# Patient Record
Sex: Female | Born: 1935 | Race: White | Hispanic: No | Smoking: Never smoker
Health system: Southern US, Community
[De-identification: ages and names within clinical notes are randomized; demographics above are authoritative.]

## PROBLEM LIST (undated history)

## (undated) DIAGNOSIS — L120 Bullous pemphigoid: Secondary | ICD-10-CM

## (undated) DIAGNOSIS — E785 Hyperlipidemia, unspecified: Secondary | ICD-10-CM

## (undated) DIAGNOSIS — S060XAA Concussion with loss of consciousness status unknown, initial encounter: Secondary | ICD-10-CM

## (undated) DIAGNOSIS — D649 Anemia, unspecified: Secondary | ICD-10-CM

## (undated) DIAGNOSIS — J302 Other seasonal allergic rhinitis: Secondary | ICD-10-CM

## (undated) DIAGNOSIS — M419 Scoliosis, unspecified: Secondary | ICD-10-CM

## (undated) DIAGNOSIS — S62109A Fracture of unspecified carpal bone, unspecified wrist, initial encounter for closed fracture: Secondary | ICD-10-CM

## (undated) DIAGNOSIS — S0291XA Unspecified fracture of skull, initial encounter for closed fracture: Secondary | ICD-10-CM

## (undated) DIAGNOSIS — E538 Deficiency of other specified B group vitamins: Secondary | ICD-10-CM

## (undated) DIAGNOSIS — M47816 Spondylosis without myelopathy or radiculopathy, lumbar region: Secondary | ICD-10-CM

## (undated) DIAGNOSIS — M1712 Unilateral primary osteoarthritis, left knee: Secondary | ICD-10-CM

## (undated) DIAGNOSIS — M8580 Other specified disorders of bone density and structure, unspecified site: Secondary | ICD-10-CM

## (undated) DIAGNOSIS — I10 Essential (primary) hypertension: Secondary | ICD-10-CM

## (undated) DIAGNOSIS — R7301 Impaired fasting glucose: Secondary | ICD-10-CM

## (undated) DIAGNOSIS — H409 Unspecified glaucoma: Secondary | ICD-10-CM

## (undated) DIAGNOSIS — G4733 Obstructive sleep apnea (adult) (pediatric): Secondary | ICD-10-CM

## (undated) DIAGNOSIS — S060X9A Concussion with loss of consciousness of unspecified duration, initial encounter: Secondary | ICD-10-CM

## (undated) DIAGNOSIS — H348122 Central retinal vein occlusion, left eye, stable: Secondary | ICD-10-CM

## (undated) DIAGNOSIS — I679 Cerebrovascular disease, unspecified: Secondary | ICD-10-CM

## (undated) DIAGNOSIS — G43909 Migraine, unspecified, not intractable, without status migrainosus: Secondary | ICD-10-CM

## (undated) HISTORY — PX: OVARIAN CYST REMOVAL: SHX89

## (undated) HISTORY — DX: Cerebrovascular disease, unspecified: I67.9

## (undated) HISTORY — DX: Scoliosis, unspecified: M41.9

## (undated) HISTORY — DX: Deficiency of other specified B group vitamins: E53.8

## (undated) HISTORY — DX: Unilateral primary osteoarthritis, left knee: M17.12

## (undated) HISTORY — PX: VAGINAL HYSTERECTOMY: SUR661

## (undated) HISTORY — DX: Fracture of unspecified carpal bone, unspecified wrist, initial encounter for closed fracture: S62.109A

## (undated) HISTORY — DX: Unspecified glaucoma: H40.9

## (undated) HISTORY — DX: Concussion with loss of consciousness of unspecified duration, initial encounter: S06.0X9A

## (undated) HISTORY — DX: Anemia, unspecified: D64.9

## (undated) HISTORY — DX: Bullous pemphigoid: L12.0

## (undated) HISTORY — DX: Obstructive sleep apnea (adult) (pediatric): G47.33

## (undated) HISTORY — DX: Hyperlipidemia, unspecified: E78.5

## (undated) HISTORY — DX: Other seasonal allergic rhinitis: J30.2

## (undated) HISTORY — DX: Spondylosis without myelopathy or radiculopathy, lumbar region: M47.816

## (undated) HISTORY — DX: Concussion with loss of consciousness status unknown, initial encounter: S06.0XAA

## (undated) HISTORY — DX: Other specified disorders of bone density and structure, unspecified site: M85.80

## (undated) HISTORY — DX: Central retinal vein occlusion, left eye, stable: H34.8122

## (undated) HISTORY — DX: Migraine, unspecified, not intractable, without status migrainosus: G43.909

## (undated) HISTORY — DX: Impaired fasting glucose: R73.01

## (undated) HISTORY — PX: ANKLE FRACTURE SURGERY: SHX122

---

## 1999-03-02 ENCOUNTER — Other Ambulatory Visit: Admission: RE | Admit: 1999-03-02 | Discharge: 1999-03-02 | Payer: Self-pay | Admitting: Internal Medicine

## 1999-12-30 ENCOUNTER — Other Ambulatory Visit: Admission: RE | Admit: 1999-12-30 | Discharge: 1999-12-30 | Payer: Self-pay | Admitting: Obstetrics and Gynecology

## 2001-03-16 ENCOUNTER — Encounter: Admission: RE | Admit: 2001-03-16 | Discharge: 2001-03-16 | Payer: Self-pay | Admitting: Endocrinology

## 2001-03-16 ENCOUNTER — Encounter: Payer: Self-pay | Admitting: Endocrinology

## 2003-01-29 ENCOUNTER — Encounter: Admission: RE | Admit: 2003-01-29 | Discharge: 2003-01-29 | Payer: Self-pay | Admitting: Internal Medicine

## 2005-11-22 ENCOUNTER — Ambulatory Visit: Payer: Self-pay | Admitting: Gynecology

## 2006-08-12 ENCOUNTER — Encounter: Admission: RE | Admit: 2006-08-12 | Discharge: 2006-08-12 | Payer: Self-pay | Admitting: Internal Medicine

## 2006-08-19 ENCOUNTER — Encounter: Admission: RE | Admit: 2006-08-19 | Discharge: 2006-08-19 | Payer: Self-pay | Admitting: Internal Medicine

## 2007-01-19 ENCOUNTER — Ambulatory Visit: Payer: Self-pay | Admitting: Internal Medicine

## 2007-01-27 ENCOUNTER — Ambulatory Visit: Payer: Self-pay | Admitting: Internal Medicine

## 2011-01-21 DIAGNOSIS — J309 Allergic rhinitis, unspecified: Secondary | ICD-10-CM | POA: Diagnosis not present

## 2011-01-29 DIAGNOSIS — J309 Allergic rhinitis, unspecified: Secondary | ICD-10-CM | POA: Diagnosis not present

## 2011-02-03 DIAGNOSIS — J309 Allergic rhinitis, unspecified: Secondary | ICD-10-CM | POA: Diagnosis not present

## 2011-02-10 DIAGNOSIS — J309 Allergic rhinitis, unspecified: Secondary | ICD-10-CM | POA: Diagnosis not present

## 2011-02-12 DIAGNOSIS — H2510 Age-related nuclear cataract, unspecified eye: Secondary | ICD-10-CM | POA: Diagnosis not present

## 2011-02-12 DIAGNOSIS — H4010X0 Unspecified open-angle glaucoma, stage unspecified: Secondary | ICD-10-CM | POA: Diagnosis not present

## 2011-02-17 DIAGNOSIS — J309 Allergic rhinitis, unspecified: Secondary | ICD-10-CM | POA: Diagnosis not present

## 2011-02-22 DIAGNOSIS — J309 Allergic rhinitis, unspecified: Secondary | ICD-10-CM | POA: Diagnosis not present

## 2011-02-26 DIAGNOSIS — Z1231 Encounter for screening mammogram for malignant neoplasm of breast: Secondary | ICD-10-CM | POA: Diagnosis not present

## 2011-03-01 DIAGNOSIS — J309 Allergic rhinitis, unspecified: Secondary | ICD-10-CM | POA: Diagnosis not present

## 2011-03-08 DIAGNOSIS — J309 Allergic rhinitis, unspecified: Secondary | ICD-10-CM | POA: Diagnosis not present

## 2011-03-17 DIAGNOSIS — J309 Allergic rhinitis, unspecified: Secondary | ICD-10-CM | POA: Diagnosis not present

## 2011-03-23 DIAGNOSIS — J309 Allergic rhinitis, unspecified: Secondary | ICD-10-CM | POA: Diagnosis not present

## 2011-03-25 DIAGNOSIS — J309 Allergic rhinitis, unspecified: Secondary | ICD-10-CM | POA: Diagnosis not present

## 2011-03-29 DIAGNOSIS — J309 Allergic rhinitis, unspecified: Secondary | ICD-10-CM | POA: Diagnosis not present

## 2011-04-06 DIAGNOSIS — J309 Allergic rhinitis, unspecified: Secondary | ICD-10-CM | POA: Diagnosis not present

## 2011-04-12 DIAGNOSIS — J309 Allergic rhinitis, unspecified: Secondary | ICD-10-CM | POA: Diagnosis not present

## 2011-04-15 DIAGNOSIS — D485 Neoplasm of uncertain behavior of skin: Secondary | ICD-10-CM | POA: Diagnosis not present

## 2011-04-15 DIAGNOSIS — L570 Actinic keratosis: Secondary | ICD-10-CM | POA: Diagnosis not present

## 2011-04-15 DIAGNOSIS — L439 Lichen planus, unspecified: Secondary | ICD-10-CM | POA: Diagnosis not present

## 2011-04-15 DIAGNOSIS — D239 Other benign neoplasm of skin, unspecified: Secondary | ICD-10-CM | POA: Diagnosis not present

## 2011-04-19 DIAGNOSIS — J309 Allergic rhinitis, unspecified: Secondary | ICD-10-CM | POA: Diagnosis not present

## 2011-04-27 DIAGNOSIS — J309 Allergic rhinitis, unspecified: Secondary | ICD-10-CM | POA: Diagnosis not present

## 2011-05-04 DIAGNOSIS — J309 Allergic rhinitis, unspecified: Secondary | ICD-10-CM | POA: Diagnosis not present

## 2011-05-10 DIAGNOSIS — J309 Allergic rhinitis, unspecified: Secondary | ICD-10-CM | POA: Diagnosis not present

## 2011-05-14 DIAGNOSIS — J309 Allergic rhinitis, unspecified: Secondary | ICD-10-CM | POA: Diagnosis not present

## 2011-05-17 DIAGNOSIS — J309 Allergic rhinitis, unspecified: Secondary | ICD-10-CM | POA: Diagnosis not present

## 2011-05-20 DIAGNOSIS — J309 Allergic rhinitis, unspecified: Secondary | ICD-10-CM | POA: Diagnosis not present

## 2011-05-24 DIAGNOSIS — J309 Allergic rhinitis, unspecified: Secondary | ICD-10-CM | POA: Diagnosis not present

## 2011-05-28 DIAGNOSIS — J309 Allergic rhinitis, unspecified: Secondary | ICD-10-CM | POA: Diagnosis not present

## 2011-06-01 DIAGNOSIS — J309 Allergic rhinitis, unspecified: Secondary | ICD-10-CM | POA: Diagnosis not present

## 2011-06-08 DIAGNOSIS — J309 Allergic rhinitis, unspecified: Secondary | ICD-10-CM | POA: Diagnosis not present

## 2011-06-14 DIAGNOSIS — H349 Unspecified retinal vascular occlusion: Secondary | ICD-10-CM | POA: Diagnosis not present

## 2011-06-14 DIAGNOSIS — J309 Allergic rhinitis, unspecified: Secondary | ICD-10-CM | POA: Diagnosis not present

## 2011-06-14 DIAGNOSIS — H409 Unspecified glaucoma: Secondary | ICD-10-CM | POA: Diagnosis not present

## 2011-06-14 DIAGNOSIS — H4011X0 Primary open-angle glaucoma, stage unspecified: Secondary | ICD-10-CM | POA: Diagnosis not present

## 2011-06-23 DIAGNOSIS — J301 Allergic rhinitis due to pollen: Secondary | ICD-10-CM | POA: Diagnosis not present

## 2011-06-23 DIAGNOSIS — J309 Allergic rhinitis, unspecified: Secondary | ICD-10-CM | POA: Diagnosis not present

## 2011-06-28 DIAGNOSIS — I10 Essential (primary) hypertension: Secondary | ICD-10-CM | POA: Diagnosis not present

## 2011-06-28 DIAGNOSIS — E039 Hypothyroidism, unspecified: Secondary | ICD-10-CM | POA: Diagnosis not present

## 2011-06-28 DIAGNOSIS — R7301 Impaired fasting glucose: Secondary | ICD-10-CM | POA: Diagnosis not present

## 2011-06-28 DIAGNOSIS — E785 Hyperlipidemia, unspecified: Secondary | ICD-10-CM | POA: Diagnosis not present

## 2011-06-28 DIAGNOSIS — E538 Deficiency of other specified B group vitamins: Secondary | ICD-10-CM | POA: Diagnosis not present

## 2011-06-29 DIAGNOSIS — J309 Allergic rhinitis, unspecified: Secondary | ICD-10-CM | POA: Diagnosis not present

## 2011-07-05 DIAGNOSIS — J309 Allergic rhinitis, unspecified: Secondary | ICD-10-CM | POA: Diagnosis not present

## 2011-07-06 DIAGNOSIS — F329 Major depressive disorder, single episode, unspecified: Secondary | ICD-10-CM | POA: Diagnosis not present

## 2011-07-06 DIAGNOSIS — Z0000 Encounter for general adult medical examination without abnormal findings: Secondary | ICD-10-CM | POA: Diagnosis not present

## 2011-07-06 DIAGNOSIS — Z124 Encounter for screening for malignant neoplasm of cervix: Secondary | ICD-10-CM | POA: Diagnosis not present

## 2011-07-06 DIAGNOSIS — E039 Hypothyroidism, unspecified: Secondary | ICD-10-CM | POA: Diagnosis not present

## 2011-07-06 DIAGNOSIS — Z79899 Other long term (current) drug therapy: Secondary | ICD-10-CM | POA: Diagnosis not present

## 2011-07-08 DIAGNOSIS — Z1212 Encounter for screening for malignant neoplasm of rectum: Secondary | ICD-10-CM | POA: Diagnosis not present

## 2011-07-12 DIAGNOSIS — J309 Allergic rhinitis, unspecified: Secondary | ICD-10-CM | POA: Diagnosis not present

## 2011-07-19 DIAGNOSIS — J309 Allergic rhinitis, unspecified: Secondary | ICD-10-CM | POA: Diagnosis not present

## 2011-07-26 DIAGNOSIS — J309 Allergic rhinitis, unspecified: Secondary | ICD-10-CM | POA: Diagnosis not present

## 2011-08-02 DIAGNOSIS — J309 Allergic rhinitis, unspecified: Secondary | ICD-10-CM | POA: Diagnosis not present

## 2011-08-10 DIAGNOSIS — J309 Allergic rhinitis, unspecified: Secondary | ICD-10-CM | POA: Diagnosis not present

## 2011-08-16 DIAGNOSIS — Z23 Encounter for immunization: Secondary | ICD-10-CM | POA: Diagnosis not present

## 2011-08-17 DIAGNOSIS — J309 Allergic rhinitis, unspecified: Secondary | ICD-10-CM | POA: Diagnosis not present

## 2011-08-24 DIAGNOSIS — J309 Allergic rhinitis, unspecified: Secondary | ICD-10-CM | POA: Diagnosis not present

## 2011-08-30 DIAGNOSIS — J309 Allergic rhinitis, unspecified: Secondary | ICD-10-CM | POA: Diagnosis not present

## 2011-08-31 DIAGNOSIS — Z111 Encounter for screening for respiratory tuberculosis: Secondary | ICD-10-CM | POA: Diagnosis not present

## 2011-09-03 DIAGNOSIS — J309 Allergic rhinitis, unspecified: Secondary | ICD-10-CM | POA: Diagnosis not present

## 2011-09-09 DIAGNOSIS — J309 Allergic rhinitis, unspecified: Secondary | ICD-10-CM | POA: Diagnosis not present

## 2011-09-15 DIAGNOSIS — J309 Allergic rhinitis, unspecified: Secondary | ICD-10-CM | POA: Diagnosis not present

## 2011-09-20 DIAGNOSIS — J309 Allergic rhinitis, unspecified: Secondary | ICD-10-CM | POA: Diagnosis not present

## 2011-09-21 DIAGNOSIS — L608 Other nail disorders: Secondary | ICD-10-CM | POA: Diagnosis not present

## 2011-09-30 DIAGNOSIS — E039 Hypothyroidism, unspecified: Secondary | ICD-10-CM | POA: Diagnosis not present

## 2011-09-30 DIAGNOSIS — J309 Allergic rhinitis, unspecified: Secondary | ICD-10-CM | POA: Diagnosis not present

## 2011-10-01 DIAGNOSIS — J309 Allergic rhinitis, unspecified: Secondary | ICD-10-CM | POA: Diagnosis not present

## 2011-10-08 DIAGNOSIS — J309 Allergic rhinitis, unspecified: Secondary | ICD-10-CM | POA: Diagnosis not present

## 2011-10-13 DIAGNOSIS — J309 Allergic rhinitis, unspecified: Secondary | ICD-10-CM | POA: Diagnosis not present

## 2011-10-19 DIAGNOSIS — J309 Allergic rhinitis, unspecified: Secondary | ICD-10-CM | POA: Diagnosis not present

## 2011-10-28 DIAGNOSIS — J309 Allergic rhinitis, unspecified: Secondary | ICD-10-CM | POA: Diagnosis not present

## 2011-11-02 DIAGNOSIS — J309 Allergic rhinitis, unspecified: Secondary | ICD-10-CM | POA: Diagnosis not present

## 2011-11-08 DIAGNOSIS — J309 Allergic rhinitis, unspecified: Secondary | ICD-10-CM | POA: Diagnosis not present

## 2011-11-09 DIAGNOSIS — Z23 Encounter for immunization: Secondary | ICD-10-CM | POA: Diagnosis not present

## 2011-11-12 DIAGNOSIS — J309 Allergic rhinitis, unspecified: Secondary | ICD-10-CM | POA: Diagnosis not present

## 2011-11-16 DIAGNOSIS — J309 Allergic rhinitis, unspecified: Secondary | ICD-10-CM | POA: Diagnosis not present

## 2011-11-23 DIAGNOSIS — J309 Allergic rhinitis, unspecified: Secondary | ICD-10-CM | POA: Diagnosis not present

## 2011-11-30 DIAGNOSIS — J309 Allergic rhinitis, unspecified: Secondary | ICD-10-CM | POA: Diagnosis not present

## 2011-12-06 DIAGNOSIS — J309 Allergic rhinitis, unspecified: Secondary | ICD-10-CM | POA: Diagnosis not present

## 2011-12-13 DIAGNOSIS — H2510 Age-related nuclear cataract, unspecified eye: Secondary | ICD-10-CM | POA: Diagnosis not present

## 2011-12-13 DIAGNOSIS — H409 Unspecified glaucoma: Secondary | ICD-10-CM | POA: Diagnosis not present

## 2011-12-13 DIAGNOSIS — H4011X0 Primary open-angle glaucoma, stage unspecified: Secondary | ICD-10-CM | POA: Diagnosis not present

## 2011-12-13 DIAGNOSIS — J309 Allergic rhinitis, unspecified: Secondary | ICD-10-CM | POA: Diagnosis not present

## 2011-12-21 DIAGNOSIS — J309 Allergic rhinitis, unspecified: Secondary | ICD-10-CM | POA: Diagnosis not present

## 2011-12-28 DIAGNOSIS — J309 Allergic rhinitis, unspecified: Secondary | ICD-10-CM | POA: Diagnosis not present

## 2012-01-04 DIAGNOSIS — J309 Allergic rhinitis, unspecified: Secondary | ICD-10-CM | POA: Diagnosis not present

## 2012-01-10 DIAGNOSIS — J309 Allergic rhinitis, unspecified: Secondary | ICD-10-CM | POA: Diagnosis not present

## 2012-01-10 DIAGNOSIS — H4011X0 Primary open-angle glaucoma, stage unspecified: Secondary | ICD-10-CM | POA: Diagnosis not present

## 2012-01-10 DIAGNOSIS — H349 Unspecified retinal vascular occlusion: Secondary | ICD-10-CM | POA: Diagnosis not present

## 2012-01-10 DIAGNOSIS — H2510 Age-related nuclear cataract, unspecified eye: Secondary | ICD-10-CM | POA: Diagnosis not present

## 2012-01-10 DIAGNOSIS — H409 Unspecified glaucoma: Secondary | ICD-10-CM | POA: Diagnosis not present

## 2012-01-19 DIAGNOSIS — R7301 Impaired fasting glucose: Secondary | ICD-10-CM | POA: Diagnosis not present

## 2012-01-19 DIAGNOSIS — J309 Allergic rhinitis, unspecified: Secondary | ICD-10-CM | POA: Diagnosis not present

## 2012-01-19 DIAGNOSIS — D518 Other vitamin B12 deficiency anemias: Secondary | ICD-10-CM | POA: Diagnosis not present

## 2012-01-19 DIAGNOSIS — E039 Hypothyroidism, unspecified: Secondary | ICD-10-CM | POA: Diagnosis not present

## 2012-01-19 DIAGNOSIS — I10 Essential (primary) hypertension: Secondary | ICD-10-CM | POA: Diagnosis not present

## 2012-01-25 DIAGNOSIS — J309 Allergic rhinitis, unspecified: Secondary | ICD-10-CM | POA: Diagnosis not present

## 2012-02-01 DIAGNOSIS — J309 Allergic rhinitis, unspecified: Secondary | ICD-10-CM | POA: Diagnosis not present

## 2012-02-07 DIAGNOSIS — J309 Allergic rhinitis, unspecified: Secondary | ICD-10-CM | POA: Diagnosis not present

## 2012-02-09 DIAGNOSIS — H2510 Age-related nuclear cataract, unspecified eye: Secondary | ICD-10-CM | POA: Diagnosis not present

## 2012-02-09 DIAGNOSIS — H25019 Cortical age-related cataract, unspecified eye: Secondary | ICD-10-CM | POA: Diagnosis not present

## 2012-02-09 DIAGNOSIS — H2589 Other age-related cataract: Secondary | ICD-10-CM | POA: Diagnosis not present

## 2012-02-16 DIAGNOSIS — J309 Allergic rhinitis, unspecified: Secondary | ICD-10-CM | POA: Diagnosis not present

## 2012-02-21 DIAGNOSIS — J309 Allergic rhinitis, unspecified: Secondary | ICD-10-CM | POA: Diagnosis not present

## 2012-02-22 DIAGNOSIS — J309 Allergic rhinitis, unspecified: Secondary | ICD-10-CM | POA: Diagnosis not present

## 2012-02-28 DIAGNOSIS — J309 Allergic rhinitis, unspecified: Secondary | ICD-10-CM | POA: Diagnosis not present

## 2012-03-31 DIAGNOSIS — J309 Allergic rhinitis, unspecified: Secondary | ICD-10-CM | POA: Diagnosis not present

## 2012-04-05 DIAGNOSIS — J309 Allergic rhinitis, unspecified: Secondary | ICD-10-CM | POA: Diagnosis not present

## 2012-04-07 DIAGNOSIS — J309 Allergic rhinitis, unspecified: Secondary | ICD-10-CM | POA: Diagnosis not present

## 2012-04-12 DIAGNOSIS — J309 Allergic rhinitis, unspecified: Secondary | ICD-10-CM | POA: Diagnosis not present

## 2012-04-20 DIAGNOSIS — J309 Allergic rhinitis, unspecified: Secondary | ICD-10-CM | POA: Diagnosis not present

## 2012-04-25 DIAGNOSIS — J309 Allergic rhinitis, unspecified: Secondary | ICD-10-CM | POA: Diagnosis not present

## 2012-05-03 DIAGNOSIS — J309 Allergic rhinitis, unspecified: Secondary | ICD-10-CM | POA: Diagnosis not present

## 2012-05-09 DIAGNOSIS — J309 Allergic rhinitis, unspecified: Secondary | ICD-10-CM | POA: Diagnosis not present

## 2012-05-11 DIAGNOSIS — D239 Other benign neoplasm of skin, unspecified: Secondary | ICD-10-CM | POA: Diagnosis not present

## 2012-05-11 DIAGNOSIS — D2370 Other benign neoplasm of skin of unspecified lower limb, including hip: Secondary | ICD-10-CM | POA: Diagnosis not present

## 2012-05-11 DIAGNOSIS — L819 Disorder of pigmentation, unspecified: Secondary | ICD-10-CM | POA: Diagnosis not present

## 2012-05-11 DIAGNOSIS — D1801 Hemangioma of skin and subcutaneous tissue: Secondary | ICD-10-CM | POA: Diagnosis not present

## 2012-05-11 DIAGNOSIS — L821 Other seborrheic keratosis: Secondary | ICD-10-CM | POA: Diagnosis not present

## 2012-05-11 DIAGNOSIS — I789 Disease of capillaries, unspecified: Secondary | ICD-10-CM | POA: Diagnosis not present

## 2012-05-11 DIAGNOSIS — L909 Atrophic disorder of skin, unspecified: Secondary | ICD-10-CM | POA: Diagnosis not present

## 2012-05-16 DIAGNOSIS — J309 Allergic rhinitis, unspecified: Secondary | ICD-10-CM | POA: Diagnosis not present

## 2012-05-25 DIAGNOSIS — J309 Allergic rhinitis, unspecified: Secondary | ICD-10-CM | POA: Diagnosis not present

## 2012-06-07 DIAGNOSIS — J309 Allergic rhinitis, unspecified: Secondary | ICD-10-CM | POA: Diagnosis not present

## 2012-06-14 DIAGNOSIS — J309 Allergic rhinitis, unspecified: Secondary | ICD-10-CM | POA: Diagnosis not present

## 2012-06-20 DIAGNOSIS — J309 Allergic rhinitis, unspecified: Secondary | ICD-10-CM | POA: Diagnosis not present

## 2012-06-23 DIAGNOSIS — Z1231 Encounter for screening mammogram for malignant neoplasm of breast: Secondary | ICD-10-CM | POA: Diagnosis not present

## 2012-06-27 DIAGNOSIS — J309 Allergic rhinitis, unspecified: Secondary | ICD-10-CM | POA: Diagnosis not present

## 2012-06-28 DIAGNOSIS — N6489 Other specified disorders of breast: Secondary | ICD-10-CM | POA: Diagnosis not present

## 2012-07-04 DIAGNOSIS — J309 Allergic rhinitis, unspecified: Secondary | ICD-10-CM | POA: Diagnosis not present

## 2012-07-10 DIAGNOSIS — J309 Allergic rhinitis, unspecified: Secondary | ICD-10-CM | POA: Diagnosis not present

## 2012-07-19 DIAGNOSIS — J309 Allergic rhinitis, unspecified: Secondary | ICD-10-CM | POA: Diagnosis not present

## 2012-07-31 DIAGNOSIS — J309 Allergic rhinitis, unspecified: Secondary | ICD-10-CM | POA: Diagnosis not present

## 2012-08-09 DIAGNOSIS — J309 Allergic rhinitis, unspecified: Secondary | ICD-10-CM | POA: Diagnosis not present

## 2012-08-15 DIAGNOSIS — H4011X0 Primary open-angle glaucoma, stage unspecified: Secondary | ICD-10-CM | POA: Diagnosis not present

## 2012-08-15 DIAGNOSIS — H409 Unspecified glaucoma: Secondary | ICD-10-CM | POA: Diagnosis not present

## 2012-08-17 DIAGNOSIS — J309 Allergic rhinitis, unspecified: Secondary | ICD-10-CM | POA: Diagnosis not present

## 2012-08-21 DIAGNOSIS — M899 Disorder of bone, unspecified: Secondary | ICD-10-CM | POA: Diagnosis not present

## 2012-08-21 DIAGNOSIS — I10 Essential (primary) hypertension: Secondary | ICD-10-CM | POA: Diagnosis not present

## 2012-08-21 DIAGNOSIS — R7301 Impaired fasting glucose: Secondary | ICD-10-CM | POA: Diagnosis not present

## 2012-08-21 DIAGNOSIS — M949 Disorder of cartilage, unspecified: Secondary | ICD-10-CM | POA: Diagnosis not present

## 2012-08-21 DIAGNOSIS — E039 Hypothyroidism, unspecified: Secondary | ICD-10-CM | POA: Diagnosis not present

## 2012-08-21 DIAGNOSIS — E538 Deficiency of other specified B group vitamins: Secondary | ICD-10-CM | POA: Diagnosis not present

## 2012-08-21 DIAGNOSIS — E785 Hyperlipidemia, unspecified: Secondary | ICD-10-CM | POA: Diagnosis not present

## 2012-08-24 DIAGNOSIS — J309 Allergic rhinitis, unspecified: Secondary | ICD-10-CM | POA: Diagnosis not present

## 2012-08-28 DIAGNOSIS — Z0000 Encounter for general adult medical examination without abnormal findings: Secondary | ICD-10-CM | POA: Diagnosis not present

## 2012-08-28 DIAGNOSIS — Z23 Encounter for immunization: Secondary | ICD-10-CM | POA: Diagnosis not present

## 2012-08-28 DIAGNOSIS — G250 Essential tremor: Secondary | ICD-10-CM | POA: Diagnosis not present

## 2012-08-28 DIAGNOSIS — E039 Hypothyroidism, unspecified: Secondary | ICD-10-CM | POA: Diagnosis not present

## 2012-08-28 DIAGNOSIS — L129 Pemphigoid, unspecified: Secondary | ICD-10-CM | POA: Diagnosis not present

## 2012-08-28 DIAGNOSIS — Z124 Encounter for screening for malignant neoplasm of cervix: Secondary | ICD-10-CM | POA: Diagnosis not present

## 2012-08-28 DIAGNOSIS — Z79899 Other long term (current) drug therapy: Secondary | ICD-10-CM | POA: Diagnosis not present

## 2012-08-28 DIAGNOSIS — F329 Major depressive disorder, single episode, unspecified: Secondary | ICD-10-CM | POA: Diagnosis not present

## 2012-08-28 DIAGNOSIS — E538 Deficiency of other specified B group vitamins: Secondary | ICD-10-CM | POA: Diagnosis not present

## 2012-09-04 DIAGNOSIS — Z1212 Encounter for screening for malignant neoplasm of rectum: Secondary | ICD-10-CM | POA: Diagnosis not present

## 2012-09-04 DIAGNOSIS — J309 Allergic rhinitis, unspecified: Secondary | ICD-10-CM | POA: Diagnosis not present

## 2012-09-12 DIAGNOSIS — J309 Allergic rhinitis, unspecified: Secondary | ICD-10-CM | POA: Diagnosis not present

## 2012-09-15 DIAGNOSIS — J309 Allergic rhinitis, unspecified: Secondary | ICD-10-CM | POA: Diagnosis not present

## 2012-09-18 DIAGNOSIS — J309 Allergic rhinitis, unspecified: Secondary | ICD-10-CM | POA: Diagnosis not present

## 2012-09-21 DIAGNOSIS — J309 Allergic rhinitis, unspecified: Secondary | ICD-10-CM | POA: Diagnosis not present

## 2012-09-25 DIAGNOSIS — J301 Allergic rhinitis due to pollen: Secondary | ICD-10-CM | POA: Diagnosis not present

## 2012-09-25 DIAGNOSIS — J309 Allergic rhinitis, unspecified: Secondary | ICD-10-CM | POA: Diagnosis not present

## 2012-10-05 DIAGNOSIS — J309 Allergic rhinitis, unspecified: Secondary | ICD-10-CM | POA: Diagnosis not present

## 2012-10-12 DIAGNOSIS — Z23 Encounter for immunization: Secondary | ICD-10-CM | POA: Diagnosis not present

## 2012-10-13 DIAGNOSIS — J309 Allergic rhinitis, unspecified: Secondary | ICD-10-CM | POA: Diagnosis not present

## 2012-10-19 DIAGNOSIS — J309 Allergic rhinitis, unspecified: Secondary | ICD-10-CM | POA: Diagnosis not present

## 2012-10-27 DIAGNOSIS — J309 Allergic rhinitis, unspecified: Secondary | ICD-10-CM | POA: Diagnosis not present

## 2012-11-06 DIAGNOSIS — J309 Allergic rhinitis, unspecified: Secondary | ICD-10-CM | POA: Diagnosis not present

## 2012-11-07 DIAGNOSIS — Z780 Asymptomatic menopausal state: Secondary | ICD-10-CM | POA: Diagnosis not present

## 2012-11-16 DIAGNOSIS — J309 Allergic rhinitis, unspecified: Secondary | ICD-10-CM | POA: Diagnosis not present

## 2012-11-22 DIAGNOSIS — J309 Allergic rhinitis, unspecified: Secondary | ICD-10-CM | POA: Diagnosis not present

## 2012-11-30 DIAGNOSIS — J309 Allergic rhinitis, unspecified: Secondary | ICD-10-CM | POA: Diagnosis not present

## 2012-12-05 DIAGNOSIS — J309 Allergic rhinitis, unspecified: Secondary | ICD-10-CM | POA: Diagnosis not present

## 2012-12-11 DIAGNOSIS — J309 Allergic rhinitis, unspecified: Secondary | ICD-10-CM | POA: Diagnosis not present

## 2012-12-18 DIAGNOSIS — J309 Allergic rhinitis, unspecified: Secondary | ICD-10-CM | POA: Diagnosis not present

## 2012-12-25 DIAGNOSIS — J309 Allergic rhinitis, unspecified: Secondary | ICD-10-CM | POA: Diagnosis not present

## 2013-01-09 DIAGNOSIS — J309 Allergic rhinitis, unspecified: Secondary | ICD-10-CM | POA: Diagnosis not present

## 2013-01-11 DIAGNOSIS — S62109A Fracture of unspecified carpal bone, unspecified wrist, initial encounter for closed fracture: Secondary | ICD-10-CM

## 2013-01-11 HISTORY — DX: Fracture of unspecified carpal bone, unspecified wrist, initial encounter for closed fracture: S62.109A

## 2013-01-15 DIAGNOSIS — Z961 Presence of intraocular lens: Secondary | ICD-10-CM | POA: Diagnosis not present

## 2013-01-15 DIAGNOSIS — H409 Unspecified glaucoma: Secondary | ICD-10-CM | POA: Diagnosis not present

## 2013-01-15 DIAGNOSIS — J309 Allergic rhinitis, unspecified: Secondary | ICD-10-CM | POA: Diagnosis not present

## 2013-01-15 DIAGNOSIS — H4011X0 Primary open-angle glaucoma, stage unspecified: Secondary | ICD-10-CM | POA: Diagnosis not present

## 2013-01-15 DIAGNOSIS — H2510 Age-related nuclear cataract, unspecified eye: Secondary | ICD-10-CM | POA: Diagnosis not present

## 2013-01-22 DIAGNOSIS — J309 Allergic rhinitis, unspecified: Secondary | ICD-10-CM | POA: Diagnosis not present

## 2013-01-26 DIAGNOSIS — J309 Allergic rhinitis, unspecified: Secondary | ICD-10-CM | POA: Diagnosis not present

## 2013-02-05 DIAGNOSIS — J309 Allergic rhinitis, unspecified: Secondary | ICD-10-CM | POA: Diagnosis not present

## 2013-02-14 DIAGNOSIS — J309 Allergic rhinitis, unspecified: Secondary | ICD-10-CM | POA: Diagnosis not present

## 2013-02-21 DIAGNOSIS — J309 Allergic rhinitis, unspecified: Secondary | ICD-10-CM | POA: Diagnosis not present

## 2013-03-02 DIAGNOSIS — M949 Disorder of cartilage, unspecified: Secondary | ICD-10-CM | POA: Diagnosis not present

## 2013-03-02 DIAGNOSIS — I10 Essential (primary) hypertension: Secondary | ICD-10-CM | POA: Diagnosis not present

## 2013-03-02 DIAGNOSIS — Z1331 Encounter for screening for depression: Secondary | ICD-10-CM | POA: Diagnosis not present

## 2013-03-02 DIAGNOSIS — D649 Anemia, unspecified: Secondary | ICD-10-CM | POA: Diagnosis not present

## 2013-03-02 DIAGNOSIS — F329 Major depressive disorder, single episode, unspecified: Secondary | ICD-10-CM | POA: Diagnosis not present

## 2013-03-02 DIAGNOSIS — M899 Disorder of bone, unspecified: Secondary | ICD-10-CM | POA: Diagnosis not present

## 2013-03-02 DIAGNOSIS — J309 Allergic rhinitis, unspecified: Secondary | ICD-10-CM | POA: Diagnosis not present

## 2013-03-02 DIAGNOSIS — E039 Hypothyroidism, unspecified: Secondary | ICD-10-CM | POA: Diagnosis not present

## 2013-03-02 DIAGNOSIS — F3289 Other specified depressive episodes: Secondary | ICD-10-CM | POA: Diagnosis not present

## 2013-03-02 DIAGNOSIS — Z6830 Body mass index (BMI) 30.0-30.9, adult: Secondary | ICD-10-CM | POA: Diagnosis not present

## 2013-03-02 DIAGNOSIS — R7301 Impaired fasting glucose: Secondary | ICD-10-CM | POA: Diagnosis not present

## 2013-03-05 DIAGNOSIS — J309 Allergic rhinitis, unspecified: Secondary | ICD-10-CM | POA: Diagnosis not present

## 2013-03-12 DIAGNOSIS — J309 Allergic rhinitis, unspecified: Secondary | ICD-10-CM | POA: Diagnosis not present

## 2013-03-16 DIAGNOSIS — J309 Allergic rhinitis, unspecified: Secondary | ICD-10-CM | POA: Diagnosis not present

## 2013-03-19 DIAGNOSIS — J309 Allergic rhinitis, unspecified: Secondary | ICD-10-CM | POA: Diagnosis not present

## 2013-03-26 DIAGNOSIS — J309 Allergic rhinitis, unspecified: Secondary | ICD-10-CM | POA: Diagnosis not present

## 2013-04-02 DIAGNOSIS — J309 Allergic rhinitis, unspecified: Secondary | ICD-10-CM | POA: Diagnosis not present

## 2013-04-10 DIAGNOSIS — J309 Allergic rhinitis, unspecified: Secondary | ICD-10-CM | POA: Diagnosis not present

## 2013-04-16 DIAGNOSIS — J309 Allergic rhinitis, unspecified: Secondary | ICD-10-CM | POA: Diagnosis not present

## 2013-04-27 DIAGNOSIS — J309 Allergic rhinitis, unspecified: Secondary | ICD-10-CM | POA: Diagnosis not present

## 2013-05-03 DIAGNOSIS — J309 Allergic rhinitis, unspecified: Secondary | ICD-10-CM | POA: Diagnosis not present

## 2013-05-09 DIAGNOSIS — J309 Allergic rhinitis, unspecified: Secondary | ICD-10-CM | POA: Diagnosis not present

## 2013-05-17 DIAGNOSIS — J309 Allergic rhinitis, unspecified: Secondary | ICD-10-CM | POA: Diagnosis not present

## 2013-05-25 DIAGNOSIS — J309 Allergic rhinitis, unspecified: Secondary | ICD-10-CM | POA: Diagnosis not present

## 2013-05-31 DIAGNOSIS — Z6829 Body mass index (BMI) 29.0-29.9, adult: Secondary | ICD-10-CM | POA: Diagnosis not present

## 2013-05-31 DIAGNOSIS — L089 Local infection of the skin and subcutaneous tissue, unspecified: Secondary | ICD-10-CM | POA: Diagnosis not present

## 2013-05-31 DIAGNOSIS — J309 Allergic rhinitis, unspecified: Secondary | ICD-10-CM | POA: Diagnosis not present

## 2013-05-31 DIAGNOSIS — I10 Essential (primary) hypertension: Secondary | ICD-10-CM | POA: Diagnosis not present

## 2013-05-31 DIAGNOSIS — S59909A Unspecified injury of unspecified elbow, initial encounter: Secondary | ICD-10-CM | POA: Diagnosis not present

## 2013-05-31 DIAGNOSIS — S6990XA Unspecified injury of unspecified wrist, hand and finger(s), initial encounter: Secondary | ICD-10-CM | POA: Diagnosis not present

## 2013-06-05 DIAGNOSIS — J309 Allergic rhinitis, unspecified: Secondary | ICD-10-CM | POA: Diagnosis not present

## 2013-06-07 DIAGNOSIS — M19049 Primary osteoarthritis, unspecified hand: Secondary | ICD-10-CM | POA: Diagnosis not present

## 2013-06-07 DIAGNOSIS — S62009A Unspecified fracture of navicular [scaphoid] bone of unspecified wrist, initial encounter for closed fracture: Secondary | ICD-10-CM | POA: Diagnosis not present

## 2013-06-08 DIAGNOSIS — S62009A Unspecified fracture of navicular [scaphoid] bone of unspecified wrist, initial encounter for closed fracture: Secondary | ICD-10-CM | POA: Diagnosis not present

## 2013-06-11 DIAGNOSIS — S62009A Unspecified fracture of navicular [scaphoid] bone of unspecified wrist, initial encounter for closed fracture: Secondary | ICD-10-CM | POA: Diagnosis not present

## 2013-06-11 DIAGNOSIS — M19049 Primary osteoarthritis, unspecified hand: Secondary | ICD-10-CM | POA: Diagnosis not present

## 2013-06-13 DIAGNOSIS — J309 Allergic rhinitis, unspecified: Secondary | ICD-10-CM | POA: Diagnosis not present

## 2013-06-21 DIAGNOSIS — S62009A Unspecified fracture of navicular [scaphoid] bone of unspecified wrist, initial encounter for closed fracture: Secondary | ICD-10-CM | POA: Diagnosis not present

## 2013-06-21 DIAGNOSIS — M19049 Primary osteoarthritis, unspecified hand: Secondary | ICD-10-CM | POA: Diagnosis not present

## 2013-07-04 DIAGNOSIS — L608 Other nail disorders: Secondary | ICD-10-CM | POA: Diagnosis not present

## 2013-07-04 DIAGNOSIS — G5760 Lesion of plantar nerve, unspecified lower limb: Secondary | ICD-10-CM | POA: Diagnosis not present

## 2013-07-11 DIAGNOSIS — L919 Hypertrophic disorder of the skin, unspecified: Secondary | ICD-10-CM | POA: Diagnosis not present

## 2013-07-11 DIAGNOSIS — L909 Atrophic disorder of skin, unspecified: Secondary | ICD-10-CM | POA: Diagnosis not present

## 2013-07-11 DIAGNOSIS — Z85828 Personal history of other malignant neoplasm of skin: Secondary | ICD-10-CM | POA: Diagnosis not present

## 2013-07-11 DIAGNOSIS — D239 Other benign neoplasm of skin, unspecified: Secondary | ICD-10-CM | POA: Diagnosis not present

## 2013-07-11 DIAGNOSIS — D1801 Hemangioma of skin and subcutaneous tissue: Secondary | ICD-10-CM | POA: Diagnosis not present

## 2013-07-11 DIAGNOSIS — L570 Actinic keratosis: Secondary | ICD-10-CM | POA: Diagnosis not present

## 2013-07-11 DIAGNOSIS — L821 Other seborrheic keratosis: Secondary | ICD-10-CM | POA: Diagnosis not present

## 2013-07-11 DIAGNOSIS — L819 Disorder of pigmentation, unspecified: Secondary | ICD-10-CM | POA: Diagnosis not present

## 2013-07-11 DIAGNOSIS — I789 Disease of capillaries, unspecified: Secondary | ICD-10-CM | POA: Diagnosis not present

## 2013-07-12 DIAGNOSIS — S62009A Unspecified fracture of navicular [scaphoid] bone of unspecified wrist, initial encounter for closed fracture: Secondary | ICD-10-CM | POA: Diagnosis not present

## 2013-07-12 DIAGNOSIS — M19049 Primary osteoarthritis, unspecified hand: Secondary | ICD-10-CM | POA: Diagnosis not present

## 2013-07-24 DIAGNOSIS — H409 Unspecified glaucoma: Secondary | ICD-10-CM | POA: Diagnosis not present

## 2013-07-24 DIAGNOSIS — H4011X0 Primary open-angle glaucoma, stage unspecified: Secondary | ICD-10-CM | POA: Diagnosis not present

## 2013-08-01 DIAGNOSIS — J309 Allergic rhinitis, unspecified: Secondary | ICD-10-CM | POA: Diagnosis not present

## 2013-08-09 DIAGNOSIS — S62009A Unspecified fracture of navicular [scaphoid] bone of unspecified wrist, initial encounter for closed fracture: Secondary | ICD-10-CM | POA: Diagnosis not present

## 2013-08-13 DIAGNOSIS — S62009A Unspecified fracture of navicular [scaphoid] bone of unspecified wrist, initial encounter for closed fracture: Secondary | ICD-10-CM | POA: Diagnosis not present

## 2013-08-15 DIAGNOSIS — M19049 Primary osteoarthritis, unspecified hand: Secondary | ICD-10-CM | POA: Diagnosis not present

## 2013-08-15 DIAGNOSIS — S62009A Unspecified fracture of navicular [scaphoid] bone of unspecified wrist, initial encounter for closed fracture: Secondary | ICD-10-CM | POA: Diagnosis not present

## 2013-09-04 DIAGNOSIS — R7301 Impaired fasting glucose: Secondary | ICD-10-CM | POA: Diagnosis not present

## 2013-09-04 DIAGNOSIS — E785 Hyperlipidemia, unspecified: Secondary | ICD-10-CM | POA: Diagnosis not present

## 2013-09-04 DIAGNOSIS — M899 Disorder of bone, unspecified: Secondary | ICD-10-CM | POA: Diagnosis not present

## 2013-09-04 DIAGNOSIS — I10 Essential (primary) hypertension: Secondary | ICD-10-CM | POA: Diagnosis not present

## 2013-09-04 DIAGNOSIS — E538 Deficiency of other specified B group vitamins: Secondary | ICD-10-CM | POA: Diagnosis not present

## 2013-09-04 DIAGNOSIS — M949 Disorder of cartilage, unspecified: Secondary | ICD-10-CM | POA: Diagnosis not present

## 2013-09-04 DIAGNOSIS — E039 Hypothyroidism, unspecified: Secondary | ICD-10-CM | POA: Diagnosis not present

## 2013-09-07 DIAGNOSIS — J301 Allergic rhinitis due to pollen: Secondary | ICD-10-CM | POA: Diagnosis not present

## 2013-09-11 DIAGNOSIS — Z0000 Encounter for general adult medical examination without abnormal findings: Secondary | ICD-10-CM | POA: Diagnosis not present

## 2013-09-11 DIAGNOSIS — E039 Hypothyroidism, unspecified: Secondary | ICD-10-CM | POA: Diagnosis not present

## 2013-09-11 DIAGNOSIS — D509 Iron deficiency anemia, unspecified: Secondary | ICD-10-CM | POA: Diagnosis not present

## 2013-09-11 DIAGNOSIS — I10 Essential (primary) hypertension: Secondary | ICD-10-CM | POA: Diagnosis not present

## 2013-09-11 DIAGNOSIS — F3289 Other specified depressive episodes: Secondary | ICD-10-CM | POA: Diagnosis not present

## 2013-09-11 DIAGNOSIS — Z1331 Encounter for screening for depression: Secondary | ICD-10-CM | POA: Diagnosis not present

## 2013-09-11 DIAGNOSIS — F329 Major depressive disorder, single episode, unspecified: Secondary | ICD-10-CM | POA: Diagnosis not present

## 2013-09-11 DIAGNOSIS — Z6829 Body mass index (BMI) 29.0-29.9, adult: Secondary | ICD-10-CM | POA: Diagnosis not present

## 2013-09-11 DIAGNOSIS — G4700 Insomnia, unspecified: Secondary | ICD-10-CM | POA: Diagnosis not present

## 2013-09-11 DIAGNOSIS — Z124 Encounter for screening for malignant neoplasm of cervix: Secondary | ICD-10-CM | POA: Diagnosis not present

## 2013-09-11 DIAGNOSIS — E785 Hyperlipidemia, unspecified: Secondary | ICD-10-CM | POA: Diagnosis not present

## 2013-09-12 DIAGNOSIS — Z1212 Encounter for screening for malignant neoplasm of rectum: Secondary | ICD-10-CM | POA: Diagnosis not present

## 2013-09-19 DIAGNOSIS — S62009A Unspecified fracture of navicular [scaphoid] bone of unspecified wrist, initial encounter for closed fracture: Secondary | ICD-10-CM | POA: Diagnosis not present

## 2013-09-19 DIAGNOSIS — M19049 Primary osteoarthritis, unspecified hand: Secondary | ICD-10-CM | POA: Diagnosis not present

## 2013-10-12 DIAGNOSIS — S62001G Unspecified fracture of navicular [scaphoid] bone of right wrist, subsequent encounter for fracture with delayed healing: Secondary | ICD-10-CM | POA: Diagnosis not present

## 2013-10-22 DIAGNOSIS — S62024D Nondisplaced fracture of middle third of navicular [scaphoid] bone of right wrist, subsequent encounter for fracture with routine healing: Secondary | ICD-10-CM | POA: Diagnosis not present

## 2013-10-24 DIAGNOSIS — L738 Other specified follicular disorders: Secondary | ICD-10-CM | POA: Diagnosis not present

## 2013-10-24 DIAGNOSIS — Z85828 Personal history of other malignant neoplasm of skin: Secondary | ICD-10-CM | POA: Diagnosis not present

## 2013-10-24 DIAGNOSIS — L570 Actinic keratosis: Secondary | ICD-10-CM | POA: Diagnosis not present

## 2013-10-27 DIAGNOSIS — Z23 Encounter for immunization: Secondary | ICD-10-CM | POA: Diagnosis not present

## 2013-11-26 DIAGNOSIS — S62024D Nondisplaced fracture of middle third of navicular [scaphoid] bone of right wrist, subsequent encounter for fracture with routine healing: Secondary | ICD-10-CM | POA: Diagnosis not present

## 2014-01-02 DIAGNOSIS — J301 Allergic rhinitis due to pollen: Secondary | ICD-10-CM | POA: Diagnosis not present

## 2014-01-07 DIAGNOSIS — M25572 Pain in left ankle and joints of left foot: Secondary | ICD-10-CM | POA: Diagnosis not present

## 2014-01-07 DIAGNOSIS — Z6828 Body mass index (BMI) 28.0-28.9, adult: Secondary | ICD-10-CM | POA: Diagnosis not present

## 2014-01-21 DIAGNOSIS — H02102 Unspecified ectropion of right lower eyelid: Secondary | ICD-10-CM | POA: Diagnosis not present

## 2014-01-21 DIAGNOSIS — H2512 Age-related nuclear cataract, left eye: Secondary | ICD-10-CM | POA: Diagnosis not present

## 2014-01-21 DIAGNOSIS — H349 Unspecified retinal vascular occlusion: Secondary | ICD-10-CM | POA: Diagnosis not present

## 2014-01-21 DIAGNOSIS — H4011X1 Primary open-angle glaucoma, mild stage: Secondary | ICD-10-CM | POA: Diagnosis not present

## 2014-01-24 DIAGNOSIS — Z1231 Encounter for screening mammogram for malignant neoplasm of breast: Secondary | ICD-10-CM | POA: Diagnosis not present

## 2014-01-31 ENCOUNTER — Encounter: Payer: Self-pay | Admitting: Internal Medicine

## 2014-03-12 DIAGNOSIS — F329 Major depressive disorder, single episode, unspecified: Secondary | ICD-10-CM | POA: Diagnosis not present

## 2014-03-12 DIAGNOSIS — I10 Essential (primary) hypertension: Secondary | ICD-10-CM | POA: Diagnosis not present

## 2014-03-12 DIAGNOSIS — E785 Hyperlipidemia, unspecified: Secondary | ICD-10-CM | POA: Diagnosis not present

## 2014-03-12 DIAGNOSIS — Z6828 Body mass index (BMI) 28.0-28.9, adult: Secondary | ICD-10-CM | POA: Diagnosis not present

## 2014-03-12 DIAGNOSIS — R7301 Impaired fasting glucose: Secondary | ICD-10-CM | POA: Diagnosis not present

## 2014-03-12 DIAGNOSIS — E039 Hypothyroidism, unspecified: Secondary | ICD-10-CM | POA: Diagnosis not present

## 2014-03-12 DIAGNOSIS — D509 Iron deficiency anemia, unspecified: Secondary | ICD-10-CM | POA: Diagnosis not present

## 2014-05-22 DIAGNOSIS — J301 Allergic rhinitis due to pollen: Secondary | ICD-10-CM | POA: Diagnosis not present

## 2014-07-03 ENCOUNTER — Encounter: Payer: Self-pay | Admitting: Internal Medicine

## 2014-07-04 DIAGNOSIS — S0001XA Abrasion of scalp, initial encounter: Secondary | ICD-10-CM | POA: Diagnosis not present

## 2014-07-04 DIAGNOSIS — W108XXA Fall (on) (from) other stairs and steps, initial encounter: Secondary | ICD-10-CM | POA: Diagnosis not present

## 2014-07-22 DIAGNOSIS — H5340 Unspecified visual field defects: Secondary | ICD-10-CM | POA: Diagnosis not present

## 2014-07-22 DIAGNOSIS — H4011X4 Primary open-angle glaucoma, indeterminate stage: Secondary | ICD-10-CM | POA: Diagnosis not present

## 2014-07-22 DIAGNOSIS — H2512 Age-related nuclear cataract, left eye: Secondary | ICD-10-CM | POA: Diagnosis not present

## 2014-07-24 ENCOUNTER — Other Ambulatory Visit: Payer: Self-pay | Admitting: Ophthalmology

## 2014-07-24 DIAGNOSIS — H5340 Unspecified visual field defects: Secondary | ICD-10-CM

## 2014-07-31 ENCOUNTER — Ambulatory Visit
Admission: RE | Admit: 2014-07-31 | Discharge: 2014-07-31 | Disposition: A | Discharge status: Home / Self Care | Payer: Medicare Other | Source: Ambulatory Visit | Attending: Ophthalmology | Admitting: Ophthalmology

## 2014-07-31 DIAGNOSIS — I639 Cerebral infarction, unspecified: Secondary | ICD-10-CM | POA: Diagnosis not present

## 2014-07-31 DIAGNOSIS — H5340 Unspecified visual field defects: Secondary | ICD-10-CM | POA: Diagnosis not present

## 2014-07-31 MED ORDER — GADOBENATE DIMEGLUMINE 529 MG/ML IV SOLN
6.0000 mL | Freq: Once | INTRAVENOUS | Status: AC | PRN
  Administered 2014-07-31: 6 mL via INTRAVENOUS

## 2014-08-15 DIAGNOSIS — I638 Other cerebral infarction: Secondary | ICD-10-CM | POA: Diagnosis not present

## 2014-08-15 DIAGNOSIS — Z6827 Body mass index (BMI) 27.0-27.9, adult: Secondary | ICD-10-CM | POA: Diagnosis not present

## 2014-08-15 DIAGNOSIS — I10 Essential (primary) hypertension: Secondary | ICD-10-CM | POA: Diagnosis not present

## 2014-08-15 DIAGNOSIS — E785 Hyperlipidemia, unspecified: Secondary | ICD-10-CM | POA: Diagnosis not present

## 2014-08-21 DIAGNOSIS — I638 Other cerebral infarction: Secondary | ICD-10-CM | POA: Diagnosis not present

## 2014-09-12 DIAGNOSIS — L918 Other hypertrophic disorders of the skin: Secondary | ICD-10-CM | POA: Diagnosis not present

## 2014-09-12 DIAGNOSIS — Z85828 Personal history of other malignant neoplasm of skin: Secondary | ICD-10-CM | POA: Diagnosis not present

## 2014-09-12 DIAGNOSIS — L570 Actinic keratosis: Secondary | ICD-10-CM | POA: Diagnosis not present

## 2014-09-12 DIAGNOSIS — L814 Other melanin hyperpigmentation: Secondary | ICD-10-CM | POA: Diagnosis not present

## 2014-09-12 DIAGNOSIS — D1801 Hemangioma of skin and subcutaneous tissue: Secondary | ICD-10-CM | POA: Diagnosis not present

## 2014-09-12 DIAGNOSIS — L821 Other seborrheic keratosis: Secondary | ICD-10-CM | POA: Diagnosis not present

## 2014-09-13 DIAGNOSIS — J301 Allergic rhinitis due to pollen: Secondary | ICD-10-CM | POA: Diagnosis not present

## 2014-09-17 DIAGNOSIS — E785 Hyperlipidemia, unspecified: Secondary | ICD-10-CM | POA: Diagnosis not present

## 2014-10-10 DIAGNOSIS — Z23 Encounter for immunization: Secondary | ICD-10-CM | POA: Diagnosis not present

## 2014-10-18 DIAGNOSIS — E039 Hypothyroidism, unspecified: Secondary | ICD-10-CM | POA: Diagnosis not present

## 2014-10-18 DIAGNOSIS — D519 Vitamin B12 deficiency anemia, unspecified: Secondary | ICD-10-CM | POA: Diagnosis not present

## 2014-10-18 DIAGNOSIS — E785 Hyperlipidemia, unspecified: Secondary | ICD-10-CM | POA: Diagnosis not present

## 2014-10-18 DIAGNOSIS — I10 Essential (primary) hypertension: Secondary | ICD-10-CM | POA: Diagnosis not present

## 2014-10-18 DIAGNOSIS — M859 Disorder of bone density and structure, unspecified: Secondary | ICD-10-CM | POA: Diagnosis not present

## 2014-10-22 DIAGNOSIS — J301 Allergic rhinitis due to pollen: Secondary | ICD-10-CM | POA: Diagnosis not present

## 2014-10-25 DIAGNOSIS — Z1389 Encounter for screening for other disorder: Secondary | ICD-10-CM | POA: Diagnosis not present

## 2014-10-25 DIAGNOSIS — M549 Dorsalgia, unspecified: Secondary | ICD-10-CM | POA: Diagnosis not present

## 2014-10-25 DIAGNOSIS — Z0000 Encounter for general adult medical examination without abnormal findings: Secondary | ICD-10-CM | POA: Diagnosis not present

## 2014-10-25 DIAGNOSIS — I2510 Atherosclerotic heart disease of native coronary artery without angina pectoris: Secondary | ICD-10-CM | POA: Diagnosis not present

## 2014-10-25 DIAGNOSIS — I638 Other cerebral infarction: Secondary | ICD-10-CM | POA: Diagnosis not present

## 2014-10-25 DIAGNOSIS — G252 Other specified forms of tremor: Secondary | ICD-10-CM | POA: Diagnosis not present

## 2014-10-25 DIAGNOSIS — L120 Bullous pemphigoid: Secondary | ICD-10-CM | POA: Diagnosis not present

## 2014-10-25 DIAGNOSIS — D519 Vitamin B12 deficiency anemia, unspecified: Secondary | ICD-10-CM | POA: Diagnosis not present

## 2014-10-25 DIAGNOSIS — F329 Major depressive disorder, single episode, unspecified: Secondary | ICD-10-CM | POA: Diagnosis not present

## 2014-10-25 DIAGNOSIS — G4700 Insomnia, unspecified: Secondary | ICD-10-CM | POA: Diagnosis not present

## 2014-10-25 DIAGNOSIS — E039 Hypothyroidism, unspecified: Secondary | ICD-10-CM | POA: Diagnosis not present

## 2014-10-25 DIAGNOSIS — E785 Hyperlipidemia, unspecified: Secondary | ICD-10-CM | POA: Diagnosis not present

## 2015-02-10 DIAGNOSIS — H401114 Primary open-angle glaucoma, right eye, indeterminate stage: Secondary | ICD-10-CM | POA: Diagnosis not present

## 2015-02-10 DIAGNOSIS — H02102 Unspecified ectropion of right lower eyelid: Secondary | ICD-10-CM | POA: Diagnosis not present

## 2015-02-10 DIAGNOSIS — H2512 Age-related nuclear cataract, left eye: Secondary | ICD-10-CM | POA: Diagnosis not present

## 2015-02-10 DIAGNOSIS — H401124 Primary open-angle glaucoma, left eye, indeterminate stage: Secondary | ICD-10-CM | POA: Diagnosis not present

## 2015-02-24 DIAGNOSIS — Z1231 Encounter for screening mammogram for malignant neoplasm of breast: Secondary | ICD-10-CM | POA: Diagnosis not present

## 2015-03-18 DIAGNOSIS — J301 Allergic rhinitis due to pollen: Secondary | ICD-10-CM | POA: Diagnosis not present

## 2015-04-29 DIAGNOSIS — Z6828 Body mass index (BMI) 28.0-28.9, adult: Secondary | ICD-10-CM | POA: Diagnosis not present

## 2015-04-29 DIAGNOSIS — I638 Other cerebral infarction: Secondary | ICD-10-CM | POA: Diagnosis not present

## 2015-04-29 DIAGNOSIS — E038 Other specified hypothyroidism: Secondary | ICD-10-CM | POA: Diagnosis not present

## 2015-04-29 DIAGNOSIS — I10 Essential (primary) hypertension: Secondary | ICD-10-CM | POA: Diagnosis not present

## 2015-04-29 DIAGNOSIS — R7301 Impaired fasting glucose: Secondary | ICD-10-CM | POA: Diagnosis not present

## 2015-04-29 DIAGNOSIS — M859 Disorder of bone density and structure, unspecified: Secondary | ICD-10-CM | POA: Diagnosis not present

## 2015-04-29 DIAGNOSIS — E784 Other hyperlipidemia: Secondary | ICD-10-CM | POA: Diagnosis not present

## 2015-06-19 DIAGNOSIS — Z90710 Acquired absence of both cervix and uterus: Secondary | ICD-10-CM | POA: Diagnosis not present

## 2015-06-19 DIAGNOSIS — S93402A Sprain of unspecified ligament of left ankle, initial encounter: Secondary | ICD-10-CM | POA: Diagnosis not present

## 2015-06-19 DIAGNOSIS — I10 Essential (primary) hypertension: Secondary | ICD-10-CM | POA: Diagnosis not present

## 2015-06-19 DIAGNOSIS — Z8673 Personal history of transient ischemic attack (TIA), and cerebral infarction without residual deficits: Secondary | ICD-10-CM | POA: Diagnosis not present

## 2015-06-19 DIAGNOSIS — E785 Hyperlipidemia, unspecified: Secondary | ICD-10-CM | POA: Diagnosis not present

## 2015-06-19 DIAGNOSIS — Z79899 Other long term (current) drug therapy: Secondary | ICD-10-CM | POA: Diagnosis not present

## 2015-06-19 DIAGNOSIS — W19XXXA Unspecified fall, initial encounter: Secondary | ICD-10-CM | POA: Diagnosis not present

## 2015-06-19 DIAGNOSIS — M25572 Pain in left ankle and joints of left foot: Secondary | ICD-10-CM | POA: Diagnosis not present

## 2015-06-19 DIAGNOSIS — Z880 Allergy status to penicillin: Secondary | ICD-10-CM | POA: Diagnosis not present

## 2015-07-25 DIAGNOSIS — J301 Allergic rhinitis due to pollen: Secondary | ICD-10-CM | POA: Diagnosis not present

## 2015-09-12 DIAGNOSIS — J301 Allergic rhinitis due to pollen: Secondary | ICD-10-CM | POA: Diagnosis not present

## 2015-09-16 DIAGNOSIS — H401124 Primary open-angle glaucoma, left eye, indeterminate stage: Secondary | ICD-10-CM | POA: Diagnosis not present

## 2015-09-16 DIAGNOSIS — H401114 Primary open-angle glaucoma, right eye, indeterminate stage: Secondary | ICD-10-CM | POA: Diagnosis not present

## 2015-10-23 DIAGNOSIS — Z23 Encounter for immunization: Secondary | ICD-10-CM | POA: Diagnosis not present

## 2015-11-04 DIAGNOSIS — E784 Other hyperlipidemia: Secondary | ICD-10-CM | POA: Diagnosis not present

## 2015-11-04 DIAGNOSIS — R8299 Other abnormal findings in urine: Secondary | ICD-10-CM | POA: Diagnosis not present

## 2015-11-04 DIAGNOSIS — R7301 Impaired fasting glucose: Secondary | ICD-10-CM | POA: Diagnosis not present

## 2015-11-04 DIAGNOSIS — I10 Essential (primary) hypertension: Secondary | ICD-10-CM | POA: Diagnosis not present

## 2015-11-04 DIAGNOSIS — E038 Other specified hypothyroidism: Secondary | ICD-10-CM | POA: Diagnosis not present

## 2015-11-04 DIAGNOSIS — M859 Disorder of bone density and structure, unspecified: Secondary | ICD-10-CM | POA: Diagnosis not present

## 2015-11-04 DIAGNOSIS — E538 Deficiency of other specified B group vitamins: Secondary | ICD-10-CM | POA: Diagnosis not present

## 2015-11-04 DIAGNOSIS — N390 Urinary tract infection, site not specified: Secondary | ICD-10-CM | POA: Diagnosis not present

## 2015-11-11 DIAGNOSIS — I638 Other cerebral infarction: Secondary | ICD-10-CM | POA: Diagnosis not present

## 2015-11-11 DIAGNOSIS — L120 Bullous pemphigoid: Secondary | ICD-10-CM | POA: Diagnosis not present

## 2015-11-11 DIAGNOSIS — Z1231 Encounter for screening mammogram for malignant neoplasm of breast: Secondary | ICD-10-CM | POA: Diagnosis not present

## 2015-11-11 DIAGNOSIS — Z1389 Encounter for screening for other disorder: Secondary | ICD-10-CM | POA: Diagnosis not present

## 2015-11-11 DIAGNOSIS — Z0000 Encounter for general adult medical examination without abnormal findings: Secondary | ICD-10-CM | POA: Diagnosis not present

## 2015-11-11 DIAGNOSIS — Z6829 Body mass index (BMI) 29.0-29.9, adult: Secondary | ICD-10-CM | POA: Diagnosis not present

## 2015-11-11 DIAGNOSIS — F329 Major depressive disorder, single episode, unspecified: Secondary | ICD-10-CM | POA: Diagnosis not present

## 2015-11-11 DIAGNOSIS — S6981XA Other specified injuries of right wrist, hand and finger(s), initial encounter: Secondary | ICD-10-CM | POA: Diagnosis not present

## 2015-11-11 DIAGNOSIS — D519 Vitamin B12 deficiency anemia, unspecified: Secondary | ICD-10-CM | POA: Diagnosis not present

## 2015-11-11 DIAGNOSIS — G252 Other specified forms of tremor: Secondary | ICD-10-CM | POA: Diagnosis not present

## 2015-11-11 DIAGNOSIS — I2510 Atherosclerotic heart disease of native coronary artery without angina pectoris: Secondary | ICD-10-CM | POA: Diagnosis not present

## 2015-11-11 DIAGNOSIS — M25572 Pain in left ankle and joints of left foot: Secondary | ICD-10-CM | POA: Diagnosis not present

## 2015-11-28 DIAGNOSIS — L918 Other hypertrophic disorders of the skin: Secondary | ICD-10-CM | POA: Diagnosis not present

## 2015-11-28 DIAGNOSIS — L570 Actinic keratosis: Secondary | ICD-10-CM | POA: Diagnosis not present

## 2015-11-28 DIAGNOSIS — I788 Other diseases of capillaries: Secondary | ICD-10-CM | POA: Diagnosis not present

## 2015-11-28 DIAGNOSIS — Z85828 Personal history of other malignant neoplasm of skin: Secondary | ICD-10-CM | POA: Diagnosis not present

## 2015-11-28 DIAGNOSIS — D2271 Melanocytic nevi of right lower limb, including hip: Secondary | ICD-10-CM | POA: Diagnosis not present

## 2015-11-28 DIAGNOSIS — L821 Other seborrheic keratosis: Secondary | ICD-10-CM | POA: Diagnosis not present

## 2015-11-28 DIAGNOSIS — D2261 Melanocytic nevi of right upper limb, including shoulder: Secondary | ICD-10-CM | POA: Diagnosis not present

## 2015-11-28 DIAGNOSIS — D225 Melanocytic nevi of trunk: Secondary | ICD-10-CM | POA: Diagnosis not present

## 2015-11-28 DIAGNOSIS — D1801 Hemangioma of skin and subcutaneous tissue: Secondary | ICD-10-CM | POA: Diagnosis not present

## 2015-12-10 DIAGNOSIS — J301 Allergic rhinitis due to pollen: Secondary | ICD-10-CM | POA: Diagnosis not present

## 2016-02-03 DIAGNOSIS — H401134 Primary open-angle glaucoma, bilateral, indeterminate stage: Secondary | ICD-10-CM | POA: Diagnosis not present

## 2016-03-02 DIAGNOSIS — J301 Allergic rhinitis due to pollen: Secondary | ICD-10-CM | POA: Diagnosis not present

## 2016-03-15 DIAGNOSIS — J301 Allergic rhinitis due to pollen: Secondary | ICD-10-CM | POA: Diagnosis not present

## 2016-03-26 DIAGNOSIS — J301 Allergic rhinitis due to pollen: Secondary | ICD-10-CM | POA: Diagnosis not present

## 2016-04-01 DIAGNOSIS — J301 Allergic rhinitis due to pollen: Secondary | ICD-10-CM | POA: Diagnosis not present

## 2016-04-08 DIAGNOSIS — J301 Allergic rhinitis due to pollen: Secondary | ICD-10-CM | POA: Diagnosis not present

## 2016-04-15 DIAGNOSIS — J301 Allergic rhinitis due to pollen: Secondary | ICD-10-CM | POA: Diagnosis not present

## 2016-04-23 DIAGNOSIS — J301 Allergic rhinitis due to pollen: Secondary | ICD-10-CM | POA: Diagnosis not present

## 2016-04-30 DIAGNOSIS — J301 Allergic rhinitis due to pollen: Secondary | ICD-10-CM | POA: Diagnosis not present

## 2016-05-10 DIAGNOSIS — J301 Allergic rhinitis due to pollen: Secondary | ICD-10-CM | POA: Diagnosis not present

## 2016-05-11 DIAGNOSIS — J301 Allergic rhinitis due to pollen: Secondary | ICD-10-CM | POA: Diagnosis not present

## 2016-05-18 DIAGNOSIS — J301 Allergic rhinitis due to pollen: Secondary | ICD-10-CM | POA: Diagnosis not present

## 2016-05-25 DIAGNOSIS — J301 Allergic rhinitis due to pollen: Secondary | ICD-10-CM | POA: Diagnosis not present

## 2016-06-04 DIAGNOSIS — H52201 Unspecified astigmatism, right eye: Secondary | ICD-10-CM | POA: Diagnosis not present

## 2016-06-04 DIAGNOSIS — H2512 Age-related nuclear cataract, left eye: Secondary | ICD-10-CM | POA: Diagnosis not present

## 2016-06-04 DIAGNOSIS — H5340 Unspecified visual field defects: Secondary | ICD-10-CM | POA: Diagnosis not present

## 2016-06-04 DIAGNOSIS — J301 Allergic rhinitis due to pollen: Secondary | ICD-10-CM | POA: Diagnosis not present

## 2016-06-04 DIAGNOSIS — H401134 Primary open-angle glaucoma, bilateral, indeterminate stage: Secondary | ICD-10-CM | POA: Diagnosis not present

## 2016-06-14 DIAGNOSIS — J301 Allergic rhinitis due to pollen: Secondary | ICD-10-CM | POA: Diagnosis not present

## 2016-06-18 DIAGNOSIS — J301 Allergic rhinitis due to pollen: Secondary | ICD-10-CM | POA: Diagnosis not present

## 2016-06-24 DIAGNOSIS — J301 Allergic rhinitis due to pollen: Secondary | ICD-10-CM | POA: Diagnosis not present

## 2016-06-24 DIAGNOSIS — J3089 Other allergic rhinitis: Secondary | ICD-10-CM | POA: Diagnosis not present

## 2016-07-01 DIAGNOSIS — J301 Allergic rhinitis due to pollen: Secondary | ICD-10-CM | POA: Diagnosis not present

## 2016-07-07 DIAGNOSIS — J301 Allergic rhinitis due to pollen: Secondary | ICD-10-CM | POA: Diagnosis not present

## 2016-07-15 DIAGNOSIS — J301 Allergic rhinitis due to pollen: Secondary | ICD-10-CM | POA: Diagnosis not present

## 2016-07-21 DIAGNOSIS — J301 Allergic rhinitis due to pollen: Secondary | ICD-10-CM | POA: Diagnosis not present

## 2016-07-30 DIAGNOSIS — J301 Allergic rhinitis due to pollen: Secondary | ICD-10-CM | POA: Diagnosis not present

## 2016-08-06 DIAGNOSIS — J301 Allergic rhinitis due to pollen: Secondary | ICD-10-CM | POA: Diagnosis not present

## 2016-08-16 DIAGNOSIS — F3289 Other specified depressive episodes: Secondary | ICD-10-CM | POA: Diagnosis not present

## 2016-08-16 DIAGNOSIS — D519 Vitamin B12 deficiency anemia, unspecified: Secondary | ICD-10-CM | POA: Diagnosis not present

## 2016-08-16 DIAGNOSIS — R7301 Impaired fasting glucose: Secondary | ICD-10-CM | POA: Diagnosis not present

## 2016-08-16 DIAGNOSIS — J301 Allergic rhinitis due to pollen: Secondary | ICD-10-CM | POA: Diagnosis not present

## 2016-08-16 DIAGNOSIS — Z6828 Body mass index (BMI) 28.0-28.9, adult: Secondary | ICD-10-CM | POA: Diagnosis not present

## 2016-08-16 DIAGNOSIS — I638 Other cerebral infarction: Secondary | ICD-10-CM | POA: Diagnosis not present

## 2016-08-16 DIAGNOSIS — R5383 Other fatigue: Secondary | ICD-10-CM | POA: Diagnosis not present

## 2016-08-16 DIAGNOSIS — E038 Other specified hypothyroidism: Secondary | ICD-10-CM | POA: Diagnosis not present

## 2016-08-20 ENCOUNTER — Telehealth: Payer: Self-pay | Admitting: Cardiology

## 2016-08-20 NOTE — Telephone Encounter (Signed)
Received incoming records from [facility] for upcoming appointment on 10/05/16 @ 9:40am with Dr. Ellyn Hack. Records given to [facility] in Medical Records. 08/20/16 ab

## 2016-08-23 DIAGNOSIS — J301 Allergic rhinitis due to pollen: Secondary | ICD-10-CM | POA: Diagnosis not present

## 2016-08-30 DIAGNOSIS — F3289 Other specified depressive episodes: Secondary | ICD-10-CM | POA: Diagnosis not present

## 2016-08-30 DIAGNOSIS — J301 Allergic rhinitis due to pollen: Secondary | ICD-10-CM | POA: Diagnosis not present

## 2016-09-06 DIAGNOSIS — J301 Allergic rhinitis due to pollen: Secondary | ICD-10-CM | POA: Diagnosis not present

## 2016-09-14 DIAGNOSIS — J301 Allergic rhinitis due to pollen: Secondary | ICD-10-CM | POA: Diagnosis not present

## 2016-09-17 DIAGNOSIS — J301 Allergic rhinitis due to pollen: Secondary | ICD-10-CM | POA: Diagnosis not present

## 2016-09-21 DIAGNOSIS — J301 Allergic rhinitis due to pollen: Secondary | ICD-10-CM | POA: Diagnosis not present

## 2016-09-29 DIAGNOSIS — J301 Allergic rhinitis due to pollen: Secondary | ICD-10-CM | POA: Diagnosis not present

## 2016-10-05 ENCOUNTER — Ambulatory Visit (INDEPENDENT_AMBULATORY_CARE_PROVIDER_SITE_OTHER): Payer: Medicare Other | Admitting: Cardiology

## 2016-10-05 ENCOUNTER — Encounter: Payer: Self-pay | Admitting: Cardiology

## 2016-10-05 VITALS — BP 122/80 | HR 68 | Ht 62.5 in | Wt 158.0 lb

## 2016-10-05 DIAGNOSIS — R5383 Other fatigue: Secondary | ICD-10-CM

## 2016-10-05 DIAGNOSIS — E785 Hyperlipidemia, unspecified: Secondary | ICD-10-CM

## 2016-10-05 DIAGNOSIS — R6889 Other general symptoms and signs: Secondary | ICD-10-CM

## 2016-10-05 DIAGNOSIS — R0609 Other forms of dyspnea: Secondary | ICD-10-CM | POA: Diagnosis not present

## 2016-10-05 DIAGNOSIS — R0789 Other chest pain: Secondary | ICD-10-CM | POA: Diagnosis not present

## 2016-10-05 DIAGNOSIS — I10 Essential (primary) hypertension: Secondary | ICD-10-CM

## 2016-10-05 DIAGNOSIS — Z8249 Family history of ischemic heart disease and other diseases of the circulatory system: Secondary | ICD-10-CM

## 2016-10-05 DIAGNOSIS — J301 Allergic rhinitis due to pollen: Secondary | ICD-10-CM | POA: Diagnosis not present

## 2016-10-05 NOTE — Progress Notes (Addendum)
PCP: Crist Infante, MD  - Rheumatologist: Dr. Patrecia Pour GI: Dr. Gillie Manners: Dr. Ubaldo Glassing Allergy: Dr. Lovey Newcomer., Orthopedic Sgx Dr. Fredna Dow. Opthalmologists: Dr. Baird Cancer & Dr. Satira Sark  Clinic Note: Chief Complaint  Patient presents with  . New Admit To SNF    Fatigue, dyspnea on exertion    HPI: Deborah Tucker is a 83 y.o. female who is being seen today for the evaluation of FATIGUE at the request of Crist Infante, MD. The patient herself also notes exertional dyspnea.  Deborah Tucker was last seen on August 6 by her PCP with a chief complaint of no energy associated with some depression and anxiety symptoms for the last 3 months.Had similar episode before that spontaneously resolved. Noted it for 3 days in a row. Has not shown interest in exercise, but does sometimes walk about a mile to the dining hall, but usually orders lunch to her room. She has been very concerned about her younger sister.  Recent Hospitalizations: None  Studies Personally Reviewed - (if available, images/films reviewed: From Epic Chart or Care Everywhere)  None  Interval History: Kimi is a very pleasant elderly woman who is being referred for fatigue symptoms. She has a sister who has some relatively significant illnesses going on and she has been quite concerned about her. At baseline Deborah Tucker however has been very active however in the last few months she has noted some fatigue and lack of get up and go. She will get short of breath sometimes walking - > but usually this is if she is walking at a fast pace, or going up an incline or stairs.. However she'll then tell me on other days she does fine and is able to walk but is almost a half mile from her room down to the dining hall. Most days she only this okay, but some days she just feels exhausted and can't begin to consider walking and at those times she notes dyspnea. Up to about 3 months ago she actually was trying to exercise and she felt that her actually when  she was doing exercise.  There was one episode a few weeks ago and an episode a year ago where she got so short of breath walking that she actually had to stop and sit down. But that is the only time when she is fully limited. Otherwise it is just feels tired. She denies any chest times or pressure when exerting herself with exception of some mild twinging type sensations. At rest she denies any dyspnea, PND orthopnea.   For the most part she denies any chest tightness or pressure with exception of the atypical symptoms as above. She denies any rapid irregular heartbeats palpitations, dizziness or wooziness. No syncope/near-syncope or TIAs amaurosis fugax.  She has been diagnosed with sleep apnea, but did not tolerate the mask. She then went to nocturnal oxygen, but did not use that either. As such, she does wake up some days just extremely fatigued. Based on her Epworth score she only notes that she will fall sleep if she lies down in the afternoon or occasional sitting and reading. Otherwise nothing. Her scores only 4. What she does notice having insomnia and that she does snore. Told that she stops breathing when she sleeps and a history of sleep apnea but does not feel like it there anymore.   No claudication.  ROS: A comprehensive was performed. Review of Systems  Constitutional: Negative for malaise/fatigue (Just on some occasions).  HENT: Positive for congestion (Depending on allergies).  Negative for nosebleeds.   Eyes: Negative for blurred vision.  Respiratory: Negative for cough and shortness of breath.        She snores  Cardiovascular:       Per history of present illness  Gastrointestinal: Negative for abdominal pain, blood in stool, heartburn and melena.  Genitourinary: Negative for hematuria.  Musculoskeletal: Positive for joint pain (Arthritis pains). Negative for myalgias.  Neurological: Negative for dizziness, focal weakness, seizures and weakness.  Endo/Heme/Allergies:  Positive for environmental allergies. Does not bruise/bleed easily.  Psychiatric/Behavioral: Negative for memory loss. The patient is nervous/anxious (Mostly concerned about her sister's health) and has insomnia.    I have reviewed and (if needed) personally updated the patient's problem list, medications, allergies, past medical and surgical history, social and family history.   Past Medical History:  Diagnosis Date  . B12 deficiency   . Bullous pemphigoid   . Central retinal vein occlusion, left eye    Left eye blind  . Cerebrovascular disease, unspecified    MRI indicates multiple small cortical infarcts.  . Concussion    H/o fall with Skull fracture & concussion  . DJD (degenerative joint disease), lumbar   . Glaucoma, right eye   . Hyperlipidemia   . IFG (impaired fasting glucose)   . Migraine headache   . OSA (obstructive sleep apnea)    Intolerant of CPAP. Is supposed to wear nocturnal oxygen, but does not  . Osteoarthritis of left knee   . Osteopenia   . Scoliosis deformity of spine   . Seasonal allergies   . Wrist fracture 2015   R wrist     Past Surgical History:  Procedure Laterality Date  . ANKLE FRACTURE SURGERY Left   . OVARIAN CYST REMOVAL    . VAGINAL HYSTERECTOMY      Current Meds  Medication Sig  . Ascorbic Acid (VITAMIN C) 100 MG tablet Take 100 mg by mouth daily.  . cholecalciferol (VITAMIN D) 1000 units tablet Take 1,000 Units by mouth daily.  . clopidogrel (PLAVIX) 75 MG tablet Take 75 mg by mouth daily.  . colesevelam (WELCHOL) 625 MG tablet Take 4 tablets by mouth daily.  Marland Kitchen estrogens, conjugated, (PREMARIN) 0.3 MG tablet Take 0.3 mg by mouth daily. Take daily for 21 days then do not take for 7 days.  Marland Kitchen ezetimibe (ZETIA) 10 MG tablet Take 10 mg by mouth daily.  . fexofenadine (ALLEGRA) 30 MG tablet Take 30 mg by mouth daily.  Marland Kitchen latanoprost (XALATAN) 0.005 % ophthalmic solution Place 1 drop into both eyes daily.  Marland Kitchen levothyroxine (SYNTHROID,  LEVOTHROID) 50 MCG tablet Take 1 tablet by mouth daily.  . metoprolol succinate (TOPROL-XL) 50 MG 24 hr tablet Take 25 mg by mouth 2 (two) times daily.  . Multiple Vitamins-Minerals (ICAPS AREDS 2) CAPS Take 1 capsule by mouth 2 (two) times daily.  Marland Kitchen olmesartan (BENICAR) 20 MG tablet Take 20 mg by mouth daily.  . Omega-3 Fatty Acids (FISH OIL) 1000 MG CAPS Take 1 capsule by mouth daily.  . pantoprazole (PROTONIX) 40 MG tablet Take 40 mg by mouth daily.  . Pitavastatin Calcium (LIVALO) 4 MG TABS Take 2 mg by mouth daily.  . timolol (TIMOPTIC) 0.5 % ophthalmic solution Place 1 drop into both eyes 2 (two) times daily.  . vitamin B-12 (CYANOCOBALAMIN) 100 MCG tablet Take 100 mcg by mouth daily.    Not on File  Social History   Social History  . Marital status: Widowed    Spouse name:  N/A  . Number of children: 2  . Years of education: 72   Occupational History  . retired.    Social History Main Topics  . Smoking status: Never Smoker  . Smokeless tobacco: Never Used  . Alcohol use 1.2 oz/week    2 Glasses of wine per week     Comment: 1-2 glasses  . Drug use: No  . Sexual activity: No   Other Topics Concern  . None   Social History Narrative   Widowed, then divorced and remarried in 2007 to Rayne (-- he subsequently developed dementia) - now divorced from [place].    2 daughters - [name] (youngest, Lives in [place], [place]), [name] (older - lives in [place], [place]).    5 GC - (1 boy, 4 girls)   1 yr college, Retired.   Worked as a Training and development officer.    1-2 glasses of wine/day.    family history includes AAA (abdominal aortic aneurysm) in her sister; COPD in her father; Dementia in her maternal grandmother and sister; Diabetes type II in her sister; Heart attack (age of onset: 64) in her sister; Heart disease in her father; Hypertension in her daughter, father, and sister; Lung cancer in her father; Seizures in her sister.  Wt Readings from Last 3 Encounters:  10/05/16  158 lb (71.7 kg)    PHYSICAL EXAM BP 122/80   Pulse 68   Ht 5' 2.5" (1.588 m)   Wt 158 lb (71.7 kg)   BMI 28.44 kg/m  Physical Exam  Constitutional: She is oriented to person, place, and time. She appears well-developed and well-nourished. No distress.  Healthy-appearing. Well-groomed  HENT:  Head: Normocephalic and atraumatic.  Eyes: EOM are normal. No scleral icterus.  Blind left eye  Neck: Normal range of motion. Neck supple. No hepatojugular reflux and no JVD present. Carotid bruit is not present.  Cardiovascular: Normal rate, regular rhythm and normal heart sounds.   Occasional extrasystoles are present. PMI is not displaced.  Exam reveals no gallop.   No murmur heard. Pulmonary/Chest: Effort normal and breath sounds normal. No respiratory distress. She has no wheezes. She has no rales.  Abdominal: Soft. Bowel sounds are normal. She exhibits no distension. There is no tenderness. There is no rebound and no guarding.  Musculoskeletal: Normal range of motion. She exhibits no edema (Trivial).  Neurological: She is alert and oriented to person, place, and time. No cranial nerve deficit.  Skin: Skin is warm and dry. No rash noted. No erythema.  Psychiatric: She has a normal mood and affect. Her behavior is normal. Judgment and thought content normal.  Somewhat anxious  Nursing note and vitals reviewed.    Adult ECG Report Not checked  Other studies Reviewed: Additional studies/ records that were reviewed today include:  Recent Labs:  Per PCP    ASSESSMENT / PLAN: Problem List Items Addressed This Visit    Atypical chest pain    She has unusual chest discomfort symptoms that she didn't mention initially, they don't happen all the time, but they sometimes are associated with dyspnea. Very unlikely to be anginal symptoms, however with the other constellation of symptoms and family history we will evaluate for coronary ischemia with Myoview stress test.      Relevant Orders    Myocardial Perfusion Imaging   Dyslipidemia, goal to be determined (Chronic)    She is currently on her labs followed by PCP. Also on Zetia and fish oil.  Relevant Medications   colesevelam (WELCHOL) 625 MG tablet   Pitavastatin Calcium (LIVALO) 4 MG TABS   ezetimibe (ZETIA) 10 MG tablet   Other Relevant Orders   Myocardial Perfusion Imaging   Dyspnea on exertion - Primary    She probably has days when she overdoes it the day before and feels tired the next day. However I'm bit concerned about possibly chronotropic incompetence and possible ischemia.  Plan: Treadmill Myoview on beta blocker with the ability to switch to [place] if necessary. Need to document the treadmill portion first.      Relevant Orders   Myocardial Perfusion Imaging   Essential hypertension (Chronic)    Blood pressure looks well controlled today but she is on a combination of Toprol and Benicar. We will need to see if would potentially need to back off on beta blocker dosage for chronotropic incompetence, she may need additional control.      Relevant Medications   colesevelam (WELCHOL) 625 MG tablet   metoprolol succinate (TOPROL-XL) 50 MG 24 hr tablet   olmesartan (BENICAR) 20 MG tablet   Pitavastatin Calcium (LIVALO) 4 MG TABS   ezetimibe (ZETIA) 10 MG tablet   Other Relevant Orders   Myocardial Perfusion Imaging   Exercise intolerance    She does have intermittent excise intolerance with dyspnea and atypical chest discomfort. Given her risk factors of hypertension, hyperlipidemia and family history, I would like to evaluate for chronotropic incompetence as well as for possible ischemia. I don't know that she'll be able to get her heart rate up while walking, therefore I would like to proceed with a Treadmill Myoview which can be converted after attempting treadmill to [facility] if necessary.  I want to treadmill done on beta blocker, therefore it is possible that she may not reach target heart rate  --this is in order to determine chronotropic incompetence.>      Relevant Orders   Myocardial Perfusion Imaging   Family history of coronary artery disease    Her father and heart disease in his 44s, and her sister had a major heart attack at age 83.  With exercise intolerance, fatigue and some atypical chest discomfort symptoms, regarding evaluated with a Myoview stress test.      Relevant Orders   Myocardial Perfusion Imaging   Fatigue    Concern for coronary ischemia is valid with exercise intolerance so we will evaluate with Myoview. I am doing the treadmill portion of Myoview to exclude chronotropic incompetence as part of the problem. She is on a repeat decent dose of beta blocker (Toprol 25 twice a day).  It could simply be that she is 83 years old and has days where she overdoes it and the following day she is tired. - This is a diagnosis of exclusion.      Relevant Orders   Myocardial Perfusion Imaging      Treadmill Myoview on beta blocker, can convert to Lexiscan  Current medicines are reviewed at length with the patient today. (+/- concerns) n/a The following changes have been made: n/a  Patient Instructions  No change with current medications     SCHEDULE AT [facility] has requested that you have en exercise stress myoview. For further information please visit [email or web address]. Please follow instruction sheet, as given.    Your physician recommends that you schedule a follow-up appointment in [place].    Studies Ordered:   Orders Placed This Encounter  Procedures  .  Myocardial Perfusion Imaging      Glenetta Hew, M.D., M.S. Interventional Cardiologist   Pager # [phone] Phone # [phone] [address]. [place], [place] [address]

## 2016-10-05 NOTE — Patient Instructions (Signed)
No change with current medications     SCHEDULE AT [place] has requested that you have en exercise stress myoview. For further information please visit [email or web address]. Please follow instruction sheet, as given.    Your physician recommends that you schedule a follow-up appointment in [place].

## 2016-10-07 ENCOUNTER — Encounter: Payer: Self-pay | Admitting: Cardiology

## 2016-10-08 DIAGNOSIS — R0789 Other chest pain: Secondary | ICD-10-CM | POA: Insufficient documentation

## 2016-10-08 NOTE — Assessment & Plan Note (Signed)
She probably has days when she overdoes it the day before and feels tired the next day. However I'm bit concerned about possibly chronotropic incompetence and possible ischemia.  Plan: Treadmill Myoview on beta blocker with the ability to switch to [place] if necessary. Need to document the treadmill portion first.

## 2016-10-08 NOTE — Assessment & Plan Note (Signed)
She does have intermittent excise intolerance with dyspnea and atypical chest discomfort. Given her risk factors of hypertension, hyperlipidemia and family history, I would like to evaluate for chronotropic incompetence as well as for possible ischemia. I don't know that she'll be able to get her heart rate up while walking, therefore I would like to proceed with a Treadmill Myoview which can be converted after attempting treadmill to [facility] if necessary.  I want to treadmill done on beta blocker, therefore it is possible that she may not reach target heart rate --this is in order to determine chronotropic incompetence.>

## 2016-10-08 NOTE — Assessment & Plan Note (Signed)
She is currently on her labs followed by PCP. Also on Zetia and fish oil.

## 2016-10-08 NOTE — Assessment & Plan Note (Signed)
Her father and heart disease in his 43s, and her sister had a major heart attack at age 81.  With exercise intolerance, fatigue and some atypical chest discomfort symptoms, regarding evaluated with a Myoview stress test.

## 2016-10-08 NOTE — Assessment & Plan Note (Signed)
Blood pressure looks well controlled today but she is on a combination of Toprol and Benicar. We will need to see if would potentially need to back off on beta blocker dosage for chronotropic incompetence, she may need additional control.

## 2016-10-08 NOTE — Assessment & Plan Note (Signed)
Concern for coronary ischemia is valid with exercise intolerance so we will evaluate with Myoview. I am doing the treadmill portion of Myoview to exclude chronotropic incompetence as part of the problem. She is on a repeat decent dose of beta blocker (Toprol 25 twice a day).  It could simply be that she is 83 years old and has days where she overdoes it and the following day she is tired. - This is a diagnosis of exclusion.

## 2016-10-08 NOTE — Assessment & Plan Note (Signed)
She has unusual chest discomfort symptoms that she didn't mention initially, they don't happen all the time, but they sometimes are associated with dyspnea. Very unlikely to be anginal symptoms, however with the other constellation of symptoms and family history we will evaluate for coronary ischemia with Myoview stress test.

## 2016-10-11 HISTORY — PX: NM MYOVIEW LTD: HXRAD82

## 2016-10-12 DIAGNOSIS — J301 Allergic rhinitis due to pollen: Secondary | ICD-10-CM | POA: Diagnosis not present

## 2016-10-14 ENCOUNTER — Telehealth (HOSPITAL_COMMUNITY): Payer: Self-pay

## 2016-10-14 NOTE — Telephone Encounter (Signed)
Encounter complete. 

## 2016-10-15 DIAGNOSIS — J301 Allergic rhinitis due to pollen: Secondary | ICD-10-CM | POA: Diagnosis not present

## 2016-10-18 DIAGNOSIS — J301 Allergic rhinitis due to pollen: Secondary | ICD-10-CM | POA: Diagnosis not present

## 2016-10-20 ENCOUNTER — Ambulatory Visit (HOSPITAL_COMMUNITY)
Admission: RE | Admit: 2016-10-20 | Discharge: 2016-10-20 | Disposition: A | Discharge status: Home / Self Care | Payer: Medicare Other | Source: Ambulatory Visit | Attending: Internal Medicine | Admitting: Internal Medicine

## 2016-10-20 DIAGNOSIS — I10 Essential (primary) hypertension: Secondary | ICD-10-CM | POA: Insufficient documentation

## 2016-10-20 DIAGNOSIS — E785 Hyperlipidemia, unspecified: Secondary | ICD-10-CM | POA: Diagnosis not present

## 2016-10-20 DIAGNOSIS — R079 Chest pain, unspecified: Secondary | ICD-10-CM | POA: Diagnosis not present

## 2016-10-20 DIAGNOSIS — R0789 Other chest pain: Secondary | ICD-10-CM | POA: Diagnosis not present

## 2016-10-20 DIAGNOSIS — R5383 Other fatigue: Secondary | ICD-10-CM | POA: Insufficient documentation

## 2016-10-20 DIAGNOSIS — R6889 Other general symptoms and signs: Secondary | ICD-10-CM | POA: Diagnosis not present

## 2016-10-20 DIAGNOSIS — Z8249 Family history of ischemic heart disease and other diseases of the circulatory system: Secondary | ICD-10-CM | POA: Insufficient documentation

## 2016-10-20 DIAGNOSIS — R0609 Other forms of dyspnea: Secondary | ICD-10-CM | POA: Diagnosis not present

## 2016-10-20 DIAGNOSIS — Z23 Encounter for immunization: Secondary | ICD-10-CM | POA: Diagnosis not present

## 2016-10-20 DIAGNOSIS — I2510 Atherosclerotic heart disease of native coronary artery without angina pectoris: Secondary | ICD-10-CM | POA: Diagnosis not present

## 2016-10-20 LAB — MYOCARDIAL PERFUSION IMAGING
CHL CUP MPHR: 137 {beats}/min
CHL CUP NUCLEAR SDS: 1
CHL CUP NUCLEAR SRS: 0
CHL CUP RESTING HR STRESS: 64 {beats}/min
CSEPED: 3 min
CSEPEW: 4.6 METS
CSEPHR: 94 %
CSEPPHR: 129 {beats}/min
Exercise duration (sec): 34 s
LV dias vol: 20 mL (ref 46–106)
LV sys vol: 63 mL
RPE: 18
SSS: 1
TID: 1.04

## 2016-10-20 MED ORDER — TECHNETIUM TC 99M TETROFOSMIN IV KIT
10.2000 | PACK | Freq: Once | INTRAVENOUS | Status: AC | PRN
  Administered 2016-10-20: 10.2 via INTRAVENOUS
  Filled 2016-10-20: qty 11

## 2016-10-20 MED ORDER — TECHNETIUM TC 99M TETROFOSMIN IV KIT
31.0000 | PACK | Freq: Once | INTRAVENOUS | Status: AC | PRN
  Administered 2016-10-20: 31 via INTRAVENOUS
  Filled 2016-10-20: qty 31

## 2016-10-24 NOTE — Progress Notes (Signed)
Good news!!. Stress Test looked good!! No sign of significant Heart Artery Disease.  Pump function is normal.  The only abnormal finding was relatively reduced exercise tolerance. This would indicate deconditioning.  Shortness of breath can also be likely related to hypertension. - Resting blood pressure is 192/91, with appropriate response to exercise.   Leonie Man, MD

## 2016-10-27 DIAGNOSIS — F4322 Adjustment disorder with anxiety: Secondary | ICD-10-CM | POA: Diagnosis not present

## 2016-10-27 DIAGNOSIS — J301 Allergic rhinitis due to pollen: Secondary | ICD-10-CM | POA: Diagnosis not present

## 2016-11-04 DIAGNOSIS — J301 Allergic rhinitis due to pollen: Secondary | ICD-10-CM | POA: Diagnosis not present

## 2016-11-09 DIAGNOSIS — J301 Allergic rhinitis due to pollen: Secondary | ICD-10-CM | POA: Diagnosis not present

## 2016-11-15 DIAGNOSIS — J301 Allergic rhinitis due to pollen: Secondary | ICD-10-CM | POA: Diagnosis not present

## 2016-11-15 DIAGNOSIS — F4322 Adjustment disorder with anxiety: Secondary | ICD-10-CM | POA: Diagnosis not present

## 2016-11-16 DIAGNOSIS — H401134 Primary open-angle glaucoma, bilateral, indeterminate stage: Secondary | ICD-10-CM | POA: Diagnosis not present

## 2016-11-19 DIAGNOSIS — J301 Allergic rhinitis due to pollen: Secondary | ICD-10-CM | POA: Diagnosis not present

## 2016-11-22 ENCOUNTER — Ambulatory Visit (INDEPENDENT_AMBULATORY_CARE_PROVIDER_SITE_OTHER): Payer: Medicare Other | Admitting: Cardiology

## 2016-11-22 ENCOUNTER — Encounter: Payer: Self-pay | Admitting: Cardiology

## 2016-11-22 VITALS — BP 161/76 | HR 67 | Ht 63.0 in | Wt 155.8 lb

## 2016-11-22 DIAGNOSIS — J301 Allergic rhinitis due to pollen: Secondary | ICD-10-CM | POA: Diagnosis not present

## 2016-11-22 DIAGNOSIS — I10 Essential (primary) hypertension: Secondary | ICD-10-CM | POA: Diagnosis not present

## 2016-11-22 DIAGNOSIS — R0609 Other forms of dyspnea: Secondary | ICD-10-CM | POA: Diagnosis not present

## 2016-11-22 DIAGNOSIS — R5383 Other fatigue: Secondary | ICD-10-CM | POA: Diagnosis not present

## 2016-11-22 DIAGNOSIS — R0789 Other chest pain: Secondary | ICD-10-CM | POA: Diagnosis not present

## 2016-11-22 NOTE — Progress Notes (Signed)
PCP: Crist Infante, MD  - Rheumatologist: Dr. Patrecia Pour GI: Dr. Gillie Manners: Dr. Ubaldo Glassing Allergy: Dr. Lovey Newcomer., Orthopedic Sgx Dr. Fredna Dow. Opthalmologists: Dr. Baird Cancer & Dr. Satira Sark  Clinic Note: Chief Complaint  Patient presents with  . Follow-up    Test results  . Fatigue  . Shortness of Breath    HPI: Deborah Tucker is a 83 y.o. female who is being seen today for the follow-up evaluation of FATIGUE at the request of Crist Infante, MD.  Seen on August 6 by her PCP with a chief complaint of no energy associated with some depression and anxiety symptoms for the last 3 months.Had similar episode before that spontaneously resolved. The patient herself also notes exertional dyspnea.  Aleli Navedo was initially seen in September.  We decided to evaluate her with a Myoview stress test.  Recent Hospitalizations: None  Studies Personally Reviewed - (if available, images/films reviewed: From Epic Chart or Care Everywhere)  Myoview October 20, 2016: Only exercised 3: 34 min.  4.6 METs normal heart rate response.  Baseline hypertension.  Normal EF  65-70%.  No regional wall motion.  No ischemia or infarction.  Poor exercise tolerance.  No chronotropic incompetence.  Interval History: Deborah Tucker is a very pleasant elderly woman who is now feeling pretty much better.  She is very happy to hear the results of her stress test.  The one thing it does indicate is that she has deconditioning, but not chronotropic incompetence.  She really is noticing that she is able to do what she wants to do, just her energy level is not as much as she would like it to be.  But she is able to walk several times a day the three quarters of a mile back and forth to the dining facility and other places at her retirement community.  She denies any resting or exertional chest tightness or pressure, and just notes the dyspnea if she walks fast.  If she walks at a even pace she does fine. She did have one fall where she was  rushing to get somewhere and basically got ahead of herself.  She fell on her chin, but had no other damage. Otherwise from a cardiac standpoint she denies any resting dyspnea symptoms, PND, orthopnea or edema.  No claudication.  No rapid irregular heartbeats or palpitations.  No syncope/near syncope or TIA/amaurosis fugax.  She indicates that she has not been able to tolerate CPAP, and is wondering if may be with her sleep apnea, using CPAP would help her fatigue.  ROS: A comprehensive was performed. Review of Systems  Constitutional: Negative for malaise/fatigue (Just on some occasions).  HENT: Negative for congestion (Depending on allergies) and nosebleeds.   Eyes: Negative for blurred vision.  Respiratory: Negative for cough and shortness of breath.        She snores  Cardiovascular:       Per history of present illness  Gastrointestinal: Negative for blood in stool and melena.  Genitourinary: Negative for hematuria.  Musculoskeletal: Positive for falls (Per HPI) and joint pain (Arthritis pains). Negative for myalgias.  Neurological: Negative for dizziness and weakness.  Endo/Heme/Allergies: Positive for environmental allergies. Does not bruise/bleed easily.  Psychiatric/Behavioral: Negative for memory loss. The patient has insomnia. The patient is not nervous/anxious (Mostly concerned about her sister's health).   All other systems reviewed and are negative.  I have reviewed and (if needed) personally updated the patient's problem list, medications, allergies, past medical and surgical history, social and family history.  Past Medical History:  Diagnosis Date  . B12 deficiency   . Bullous pemphigoid   . Central retinal vein occlusion, left eye    Left eye blind  . Cerebrovascular disease, unspecified    MRI indicates multiple small cortical infarcts.  . Concussion    H/o fall with Skull fracture & concussion  . DJD (degenerative joint disease), lumbar   . Glaucoma, right eye    . Hyperlipidemia   . IFG (impaired fasting glucose)   . Migraine headache   . OSA (obstructive sleep apnea)    Intolerant of CPAP. Is supposed to wear nocturnal oxygen, but does not  . Osteoarthritis of left knee   . Osteopenia   . Scoliosis deformity of spine   . Seasonal allergies   . Wrist fracture 2015   R wrist     Past Surgical History:  Procedure Laterality Date  . ANKLE FRACTURE SURGERY Left   . NM MYOVIEW LTD  10/2016   Only exercised 3: 34 min.  4.6 METs normal heart rate response.  Baseline hypertension.  Normal EF  65-70%.  No regional wall motion.  No ischemia or infarction.  Poor exercise tolerance.  No chronotropic incompetence.  . OVARIAN CYST REMOVAL    . VAGINAL HYSTERECTOMY      Current Meds  Medication Sig  . Ascorbic Acid (VITAMIN C) 100 MG tablet Take 100 mg by mouth daily.  . cholecalciferol (VITAMIN D) 1000 units tablet Take 1,000 Units by mouth daily.  . clopidogrel (PLAVIX) 75 MG tablet Take 75 mg by mouth daily.  . colesevelam (WELCHOL) 625 MG tablet Take 4 tablets by mouth daily.  Marland Kitchen estrogens, conjugated, (PREMARIN) 0.3 MG tablet Take 0.3 mg by mouth daily. Take daily for 21 days then do not take for 7 days.  Marland Kitchen ezetimibe (ZETIA) 10 MG tablet Take 10 mg by mouth daily.  . fexofenadine (ALLEGRA) 30 MG tablet Take 30 mg by mouth daily.  Marland Kitchen latanoprost (XALATAN) 0.005 % ophthalmic solution Place 1 drop into both eyes daily.  Marland Kitchen levothyroxine (SYNTHROID, LEVOTHROID) 50 MCG tablet Take 1 tablet by mouth daily.  . metoprolol succinate (TOPROL-XL) 50 MG 24 hr tablet Take 25 mg by mouth 2 (two) times daily.  . Multiple Vitamins-Minerals (ICAPS AREDS 2) CAPS Take 1 capsule by mouth 2 (two) times daily.  Marland Kitchen olmesartan (BENICAR) 20 MG tablet Take 20 mg by mouth daily.  . Omega-3 Fatty Acids (FISH OIL) 1000 MG CAPS Take 1 capsule by mouth daily.  . pantoprazole (PROTONIX) 40 MG tablet Take 40 mg by mouth daily.  . Pitavastatin Calcium (LIVALO) 4 MG TABS Take 2 mg  by mouth daily.  . timolol (TIMOPTIC) 0.5 % ophthalmic solution Place 1 drop into both eyes 2 (two) times daily.  . vitamin B-12 (CYANOCOBALAMIN) 100 MCG tablet Take 100 mcg by mouth daily.    Allergies  Allergen Reactions  . Penicillins Hives    Social History   Socioeconomic History  . Marital status: Widowed    Spouse name: None  . Number of children: 2  . Years of education: 28  . Highest education level: None  Social Needs  . Financial resource strain: None  . Food insecurity - worry: None  . Food insecurity - inability: None  . Transportation needs - medical: None  . Transportation needs - non-medical: None  Occupational History  . Occupation: retired.  Tobacco Use  . Smoking status: Never Smoker  . Smokeless tobacco: Never Used  Substance and Sexual  Activity  . Alcohol use: Yes    Alcohol/week: 1.2 oz    Types: 2 Glasses of wine per week    Comment: 1-2 glasses  . Drug use: No  . Sexual activity: No  Other Topics Concern  . None  Social History Narrative   Widowed, then divorced and remarried in 2007 to [place] (-- he subsequently developed dementia) - now divorced from [place].    2 daughters - [name] (youngest, Lives in [place], [place]), [name] (older - lives in [place], [place]).    5 GC - (1 boy, 4 girls)   1 yr college, Retired.   Worked as a Training and development officer.    1-2 glasses of wine/day.    family history includes AAA (abdominal aortic aneurysm) in her sister; COPD in her father; Dementia in her maternal grandmother and sister; Diabetes type II in her sister; Heart attack (age of onset: 32) in her sister; Heart disease in her father; Hypertension in her daughter, father, and sister; Lung cancer in her father; Seizures in her sister.  Wt Readings from Last 3 Encounters:  11/22/16 155 lb 12.8 oz (70.7 kg)  10/20/16 158 lb (71.7 kg)  10/05/16 158 lb (71.7 kg)    PHYSICAL EXAM BP (!) 161/76   Pulse 67   Ht 5\' 3"  (1.6 m)   Wt 155 lb 12.8 oz (70.7 kg)    BMI 27.60 kg/m  Physical Exam  Constitutional: She is oriented to person, place, and time. She appears well-developed and well-nourished. No distress.  Healthy-appearing. Well-groomed  HENT:  Head: Normocephalic and atraumatic.  Neck: No hepatojugular reflux and no JVD present. Carotid bruit is not present.  Cardiovascular: Normal rate, regular rhythm and normal heart sounds.  No extrasystoles are present. PMI is not displaced. Exam reveals no gallop.  No murmur heard. Pulmonary/Chest: Effort normal and breath sounds normal. No respiratory distress. She has no wheezes. She has no rales.  Abdominal: Soft. Bowel sounds are normal. She exhibits no distension. There is no tenderness. There is no rebound.  Musculoskeletal: Normal range of motion. She exhibits no edema (Trivial).  Neurological: She is alert and oriented to person, place, and time.  Psychiatric: She has a normal mood and affect. Her behavior is normal. Judgment and thought content normal.  Somewhat anxious  Nursing note and vitals reviewed.    Adult ECG Report Not checked  Other studies Reviewed: Additional studies/ records that were reviewed today include:  Recent Labs:  Per PCP    ASSESSMENT / PLAN: Problem List Items Addressed This Visit    Atypical chest pain    Probably not cardiac in nature based on the results of her Myoview stress test.      Dyspnea on exertion    Most likely related to deconditioning.  No signs of ischemia while able to reach target heart rate on treadmill.  This would argue against chronotropic incompetence related to beta-blocker. I think she was limited by arthritis with walking as much as anything else, but still did not go very long.  This would indicate deconditioning as the primary issue. Recommendation is to continue to exercise and monitor dietary intake.      Essential hypertension (Chronic)    Blood pressure is little higher today Toprol and Benicar.  No sign of chronotropic  incompetence on Toprol.   Her pressures need close monitoring to see if she potentially would benefit from titration of her Benicar.  At present I am going to send the monitor  since her last visit showed normal blood pressure.  However her treadmill stress test showed hypertensive response and this would argue strongly to consider titration of Benicar up.  I am just reluctant to do so to avoid concerns of potential orthostatic symptoms.      Fatigue - Primary (Chronic)    Basically the treadmill portion of the Myoview excluded chronotropic incompetence on Toprol. Consider the possibility that this is related to her sleep apnea.  She perhaps she does need to have some type of option for CPAP or at a minimum oxygen at night.         Treadmill Myoview on beta blocker, can convert to Lexiscan  Current medicines are reviewed at length with the patient today. (+/- concerns) n/a The following changes have been made: n/a  Patient Instructions  No change with treatment or medications   Keep active, exercise    Discuss with PRIMARY about  C-PAP MACHINE.     Your physician recommends that you schedule a follow-up appointment on an as needed basis.     Studies Ordered:   No orders of the defined types were placed in this encounter.     Glenetta Hew, M.D., M.S. Interventional Cardiologist   Pager # [phone] Phone # [phone] [address] [place], [place] [address]

## 2016-11-22 NOTE — Patient Instructions (Signed)
No change with treatment or medications   Keep active, exercise    Discuss with PRIMARY about  C-PAP MACHINE.     Your physician recommends that you schedule a follow-up appointment on an as needed basis.

## 2016-11-23 ENCOUNTER — Encounter: Payer: Self-pay | Admitting: Cardiology

## 2016-11-23 NOTE — Assessment & Plan Note (Signed)
Most likely related to deconditioning.  No signs of ischemia while able to reach target heart rate on treadmill.  This would argue against chronotropic incompetence related to beta-blocker. I think she was limited by arthritis with walking as much as anything else, but still did not go very long.  This would indicate deconditioning as the primary issue. Recommendation is to continue to exercise and monitor dietary intake.

## 2016-11-23 NOTE — Assessment & Plan Note (Signed)
Basically the treadmill portion of the Myoview excluded chronotropic incompetence on Toprol. Consider the possibility that this is related to her sleep apnea.  She perhaps she does need to have some type of option for CPAP or at a minimum oxygen at night.

## 2016-11-23 NOTE — Assessment & Plan Note (Signed)
Blood pressure is little higher today Toprol and Benicar.  No sign of chronotropic incompetence on Toprol.   Her pressures need close monitoring to see if she potentially would benefit from titration of her Benicar.  At present I am going to send the monitor since her last visit showed normal blood pressure.  However her treadmill stress test showed hypertensive response and this would argue strongly to consider titration of Benicar up.  I am just reluctant to do so to avoid concerns of potential orthostatic symptoms.

## 2016-11-23 NOTE — Assessment & Plan Note (Signed)
Probably not cardiac in nature based on the results of her Myoview stress test.

## 2016-11-26 DIAGNOSIS — J301 Allergic rhinitis due to pollen: Secondary | ICD-10-CM | POA: Diagnosis not present

## 2016-12-01 DIAGNOSIS — J301 Allergic rhinitis due to pollen: Secondary | ICD-10-CM | POA: Diagnosis not present

## 2016-12-10 DIAGNOSIS — J301 Allergic rhinitis due to pollen: Secondary | ICD-10-CM | POA: Diagnosis not present

## 2016-12-15 DIAGNOSIS — J301 Allergic rhinitis due to pollen: Secondary | ICD-10-CM | POA: Diagnosis not present

## 2016-12-16 DIAGNOSIS — F411 Generalized anxiety disorder: Secondary | ICD-10-CM | POA: Diagnosis not present

## 2016-12-24 DIAGNOSIS — F411 Generalized anxiety disorder: Secondary | ICD-10-CM | POA: Diagnosis not present

## 2016-12-27 DIAGNOSIS — J301 Allergic rhinitis due to pollen: Secondary | ICD-10-CM | POA: Diagnosis not present

## 2017-01-05 DIAGNOSIS — J301 Allergic rhinitis due to pollen: Secondary | ICD-10-CM | POA: Diagnosis not present

## 2017-01-18 DIAGNOSIS — J069 Acute upper respiratory infection, unspecified: Secondary | ICD-10-CM | POA: Diagnosis not present

## 2017-01-18 DIAGNOSIS — I10 Essential (primary) hypertension: Secondary | ICD-10-CM | POA: Diagnosis not present

## 2017-01-18 DIAGNOSIS — Z6828 Body mass index (BMI) 28.0-28.9, adult: Secondary | ICD-10-CM | POA: Diagnosis not present

## 2017-01-18 DIAGNOSIS — R05 Cough: Secondary | ICD-10-CM | POA: Diagnosis not present

## 2017-01-19 DIAGNOSIS — J301 Allergic rhinitis due to pollen: Secondary | ICD-10-CM | POA: Diagnosis not present

## 2017-01-19 DIAGNOSIS — M859 Disorder of bone density and structure, unspecified: Secondary | ICD-10-CM | POA: Diagnosis not present

## 2017-01-19 DIAGNOSIS — I10 Essential (primary) hypertension: Secondary | ICD-10-CM | POA: Diagnosis not present

## 2017-01-19 DIAGNOSIS — E038 Other specified hypothyroidism: Secondary | ICD-10-CM | POA: Diagnosis not present

## 2017-01-19 DIAGNOSIS — R82998 Other abnormal findings in urine: Secondary | ICD-10-CM | POA: Diagnosis not present

## 2017-01-19 DIAGNOSIS — R7301 Impaired fasting glucose: Secondary | ICD-10-CM | POA: Diagnosis not present

## 2017-01-19 DIAGNOSIS — D518 Other vitamin B12 deficiency anemias: Secondary | ICD-10-CM | POA: Diagnosis not present

## 2017-01-19 DIAGNOSIS — D519 Vitamin B12 deficiency anemia, unspecified: Secondary | ICD-10-CM | POA: Diagnosis not present

## 2017-01-19 DIAGNOSIS — E7849 Other hyperlipidemia: Secondary | ICD-10-CM | POA: Diagnosis not present

## 2017-01-25 DIAGNOSIS — L120 Bullous pemphigoid: Secondary | ICD-10-CM | POA: Diagnosis not present

## 2017-01-25 DIAGNOSIS — E7849 Other hyperlipidemia: Secondary | ICD-10-CM | POA: Diagnosis not present

## 2017-01-25 DIAGNOSIS — I10 Essential (primary) hypertension: Secondary | ICD-10-CM | POA: Diagnosis not present

## 2017-01-25 DIAGNOSIS — I6389 Other cerebral infarction: Secondary | ICD-10-CM | POA: Diagnosis not present

## 2017-01-25 DIAGNOSIS — M25572 Pain in left ankle and joints of left foot: Secondary | ICD-10-CM | POA: Diagnosis not present

## 2017-01-25 DIAGNOSIS — Z0000 Encounter for general adult medical examination without abnormal findings: Secondary | ICD-10-CM | POA: Diagnosis not present

## 2017-01-25 DIAGNOSIS — M5489 Other dorsalgia: Secondary | ICD-10-CM | POA: Diagnosis not present

## 2017-01-25 DIAGNOSIS — Z6828 Body mass index (BMI) 28.0-28.9, adult: Secondary | ICD-10-CM | POA: Diagnosis not present

## 2017-01-25 DIAGNOSIS — H348192 Central retinal vein occlusion, unspecified eye, stable: Secondary | ICD-10-CM | POA: Diagnosis not present

## 2017-01-25 DIAGNOSIS — G252 Other specified forms of tremor: Secondary | ICD-10-CM | POA: Diagnosis not present

## 2017-01-25 DIAGNOSIS — Z1389 Encounter for screening for other disorder: Secondary | ICD-10-CM | POA: Diagnosis not present

## 2017-01-25 DIAGNOSIS — I2510 Atherosclerotic heart disease of native coronary artery without angina pectoris: Secondary | ICD-10-CM | POA: Diagnosis not present

## 2017-02-07 DIAGNOSIS — F411 Generalized anxiety disorder: Secondary | ICD-10-CM | POA: Diagnosis not present

## 2017-02-07 DIAGNOSIS — J301 Allergic rhinitis due to pollen: Secondary | ICD-10-CM | POA: Diagnosis not present

## 2017-02-16 DIAGNOSIS — J301 Allergic rhinitis due to pollen: Secondary | ICD-10-CM | POA: Diagnosis not present

## 2017-02-25 DIAGNOSIS — L814 Other melanin hyperpigmentation: Secondary | ICD-10-CM | POA: Diagnosis not present

## 2017-02-25 DIAGNOSIS — C44722 Squamous cell carcinoma of skin of right lower limb, including hip: Secondary | ICD-10-CM | POA: Diagnosis not present

## 2017-02-25 DIAGNOSIS — Z85828 Personal history of other malignant neoplasm of skin: Secondary | ICD-10-CM | POA: Diagnosis not present

## 2017-02-25 DIAGNOSIS — D485 Neoplasm of uncertain behavior of skin: Secondary | ICD-10-CM | POA: Diagnosis not present

## 2017-02-25 DIAGNOSIS — D225 Melanocytic nevi of trunk: Secondary | ICD-10-CM | POA: Diagnosis not present

## 2017-02-25 DIAGNOSIS — J301 Allergic rhinitis due to pollen: Secondary | ICD-10-CM | POA: Diagnosis not present

## 2017-02-25 DIAGNOSIS — L570 Actinic keratosis: Secondary | ICD-10-CM | POA: Diagnosis not present

## 2017-02-25 DIAGNOSIS — D0471 Carcinoma in situ of skin of right lower limb, including hip: Secondary | ICD-10-CM | POA: Diagnosis not present

## 2017-02-25 DIAGNOSIS — L821 Other seborrheic keratosis: Secondary | ICD-10-CM | POA: Diagnosis not present

## 2017-02-25 DIAGNOSIS — D1801 Hemangioma of skin and subcutaneous tissue: Secondary | ICD-10-CM | POA: Diagnosis not present

## 2017-02-28 DIAGNOSIS — F411 Generalized anxiety disorder: Secondary | ICD-10-CM | POA: Diagnosis not present

## 2017-03-07 DIAGNOSIS — J301 Allergic rhinitis due to pollen: Secondary | ICD-10-CM | POA: Diagnosis not present

## 2017-03-14 DIAGNOSIS — Z1212 Encounter for screening for malignant neoplasm of rectum: Secondary | ICD-10-CM | POA: Diagnosis not present

## 2017-03-14 DIAGNOSIS — Z1211 Encounter for screening for malignant neoplasm of colon: Secondary | ICD-10-CM | POA: Diagnosis not present

## 2017-03-18 DIAGNOSIS — J301 Allergic rhinitis due to pollen: Secondary | ICD-10-CM | POA: Diagnosis not present

## 2017-03-25 DIAGNOSIS — J301 Allergic rhinitis due to pollen: Secondary | ICD-10-CM | POA: Diagnosis not present

## 2017-03-28 DIAGNOSIS — F411 Generalized anxiety disorder: Secondary | ICD-10-CM | POA: Diagnosis not present

## 2017-04-01 DIAGNOSIS — J301 Allergic rhinitis due to pollen: Secondary | ICD-10-CM | POA: Diagnosis not present

## 2017-04-08 DIAGNOSIS — J301 Allergic rhinitis due to pollen: Secondary | ICD-10-CM | POA: Diagnosis not present

## 2017-04-15 DIAGNOSIS — J301 Allergic rhinitis due to pollen: Secondary | ICD-10-CM | POA: Diagnosis not present

## 2017-04-22 DIAGNOSIS — J301 Allergic rhinitis due to pollen: Secondary | ICD-10-CM | POA: Diagnosis not present

## 2017-04-25 DIAGNOSIS — F411 Generalized anxiety disorder: Secondary | ICD-10-CM | POA: Diagnosis not present

## 2017-04-27 DIAGNOSIS — J301 Allergic rhinitis due to pollen: Secondary | ICD-10-CM | POA: Diagnosis not present

## 2017-04-28 DIAGNOSIS — J301 Allergic rhinitis due to pollen: Secondary | ICD-10-CM | POA: Diagnosis not present

## 2017-04-28 DIAGNOSIS — J3089 Other allergic rhinitis: Secondary | ICD-10-CM | POA: Diagnosis not present

## 2017-05-09 DIAGNOSIS — M859 Disorder of bone density and structure, unspecified: Secondary | ICD-10-CM | POA: Diagnosis not present

## 2017-05-09 DIAGNOSIS — J301 Allergic rhinitis due to pollen: Secondary | ICD-10-CM | POA: Diagnosis not present

## 2017-05-16 DIAGNOSIS — J301 Allergic rhinitis due to pollen: Secondary | ICD-10-CM | POA: Diagnosis not present

## 2017-05-19 DIAGNOSIS — J301 Allergic rhinitis due to pollen: Secondary | ICD-10-CM | POA: Diagnosis not present

## 2017-05-23 DIAGNOSIS — F411 Generalized anxiety disorder: Secondary | ICD-10-CM | POA: Diagnosis not present

## 2017-05-23 DIAGNOSIS — J3081 Allergic rhinitis due to animal (cat) (dog) hair and dander: Secondary | ICD-10-CM | POA: Diagnosis not present

## 2017-05-23 DIAGNOSIS — J3089 Other allergic rhinitis: Secondary | ICD-10-CM | POA: Diagnosis not present

## 2017-05-23 DIAGNOSIS — J301 Allergic rhinitis due to pollen: Secondary | ICD-10-CM | POA: Diagnosis not present

## 2017-05-24 DIAGNOSIS — H43811 Vitreous degeneration, right eye: Secondary | ICD-10-CM | POA: Diagnosis not present

## 2017-05-24 DIAGNOSIS — H401134 Primary open-angle glaucoma, bilateral, indeterminate stage: Secondary | ICD-10-CM | POA: Diagnosis not present

## 2017-05-24 DIAGNOSIS — H2512 Age-related nuclear cataract, left eye: Secondary | ICD-10-CM | POA: Diagnosis not present

## 2017-05-24 DIAGNOSIS — H52201 Unspecified astigmatism, right eye: Secondary | ICD-10-CM | POA: Diagnosis not present

## 2017-05-27 DIAGNOSIS — J301 Allergic rhinitis due to pollen: Secondary | ICD-10-CM | POA: Diagnosis not present

## 2017-06-02 DIAGNOSIS — J301 Allergic rhinitis due to pollen: Secondary | ICD-10-CM | POA: Diagnosis not present

## 2017-06-03 DIAGNOSIS — Z1231 Encounter for screening mammogram for malignant neoplasm of breast: Secondary | ICD-10-CM | POA: Diagnosis not present

## 2017-06-09 DIAGNOSIS — J301 Allergic rhinitis due to pollen: Secondary | ICD-10-CM | POA: Diagnosis not present

## 2017-06-17 DIAGNOSIS — J301 Allergic rhinitis due to pollen: Secondary | ICD-10-CM | POA: Diagnosis not present

## 2017-06-24 DIAGNOSIS — J301 Allergic rhinitis due to pollen: Secondary | ICD-10-CM | POA: Diagnosis not present

## 2017-06-27 DIAGNOSIS — F411 Generalized anxiety disorder: Secondary | ICD-10-CM | POA: Diagnosis not present

## 2017-07-01 DIAGNOSIS — J3089 Other allergic rhinitis: Secondary | ICD-10-CM | POA: Diagnosis not present

## 2017-07-01 DIAGNOSIS — J301 Allergic rhinitis due to pollen: Secondary | ICD-10-CM | POA: Diagnosis not present

## 2017-07-13 DIAGNOSIS — J301 Allergic rhinitis due to pollen: Secondary | ICD-10-CM | POA: Diagnosis not present

## 2017-07-20 DIAGNOSIS — J301 Allergic rhinitis due to pollen: Secondary | ICD-10-CM | POA: Diagnosis not present

## 2017-07-25 DIAGNOSIS — R7301 Impaired fasting glucose: Secondary | ICD-10-CM | POA: Diagnosis not present

## 2017-07-25 DIAGNOSIS — I10 Essential (primary) hypertension: Secondary | ICD-10-CM | POA: Diagnosis not present

## 2017-07-25 DIAGNOSIS — Z6828 Body mass index (BMI) 28.0-28.9, adult: Secondary | ICD-10-CM | POA: Diagnosis not present

## 2017-07-25 DIAGNOSIS — J3089 Other allergic rhinitis: Secondary | ICD-10-CM | POA: Diagnosis not present

## 2017-07-25 DIAGNOSIS — R5383 Other fatigue: Secondary | ICD-10-CM | POA: Diagnosis not present

## 2017-07-25 DIAGNOSIS — E038 Other specified hypothyroidism: Secondary | ICD-10-CM | POA: Diagnosis not present

## 2017-07-25 DIAGNOSIS — G4733 Obstructive sleep apnea (adult) (pediatric): Secondary | ICD-10-CM | POA: Diagnosis not present

## 2017-07-28 DIAGNOSIS — J301 Allergic rhinitis due to pollen: Secondary | ICD-10-CM | POA: Diagnosis not present

## 2017-07-29 DIAGNOSIS — F411 Generalized anxiety disorder: Secondary | ICD-10-CM | POA: Diagnosis not present

## 2017-08-08 DIAGNOSIS — J301 Allergic rhinitis due to pollen: Secondary | ICD-10-CM | POA: Diagnosis not present

## 2017-08-15 DIAGNOSIS — J301 Allergic rhinitis due to pollen: Secondary | ICD-10-CM | POA: Diagnosis not present

## 2017-08-22 DIAGNOSIS — F411 Generalized anxiety disorder: Secondary | ICD-10-CM | POA: Diagnosis not present

## 2017-08-22 DIAGNOSIS — J301 Allergic rhinitis due to pollen: Secondary | ICD-10-CM | POA: Diagnosis not present

## 2017-08-29 DIAGNOSIS — J301 Allergic rhinitis due to pollen: Secondary | ICD-10-CM | POA: Diagnosis not present

## 2017-09-08 DIAGNOSIS — J301 Allergic rhinitis due to pollen: Secondary | ICD-10-CM | POA: Diagnosis not present

## 2017-09-19 DIAGNOSIS — J301 Allergic rhinitis due to pollen: Secondary | ICD-10-CM | POA: Diagnosis not present

## 2017-09-19 DIAGNOSIS — F411 Generalized anxiety disorder: Secondary | ICD-10-CM | POA: Diagnosis not present

## 2017-09-23 ENCOUNTER — Emergency Department (HOSPITAL_COMMUNITY): Payer: Medicare Other

## 2017-09-23 ENCOUNTER — Emergency Department (HOSPITAL_COMMUNITY)
Admission: EM | Admit: 2017-09-23 | Discharge: 2017-09-23 | Disposition: A | Discharge status: Home / Self Care | Payer: Medicare Other | Attending: Emergency Medicine | Admitting: Emergency Medicine

## 2017-09-23 ENCOUNTER — Encounter (HOSPITAL_COMMUNITY): Payer: Self-pay

## 2017-09-23 ENCOUNTER — Other Ambulatory Visit: Payer: Self-pay

## 2017-09-23 DIAGNOSIS — S8982XA Other specified injuries of left lower leg, initial encounter: Secondary | ICD-10-CM | POA: Diagnosis not present

## 2017-09-23 DIAGNOSIS — W01198A Fall on same level from slipping, tripping and stumbling with subsequent striking against other object, initial encounter: Secondary | ICD-10-CM | POA: Diagnosis not present

## 2017-09-23 DIAGNOSIS — Y939 Activity, unspecified: Secondary | ICD-10-CM | POA: Insufficient documentation

## 2017-09-23 DIAGNOSIS — Y999 Unspecified external cause status: Secondary | ICD-10-CM | POA: Diagnosis not present

## 2017-09-23 DIAGNOSIS — Z79899 Other long term (current) drug therapy: Secondary | ICD-10-CM | POA: Insufficient documentation

## 2017-09-23 DIAGNOSIS — Y929 Unspecified place or not applicable: Secondary | ICD-10-CM | POA: Diagnosis not present

## 2017-09-23 DIAGNOSIS — S81812A Laceration without foreign body, left lower leg, initial encounter: Secondary | ICD-10-CM

## 2017-09-23 DIAGNOSIS — S098XXA Other specified injuries of head, initial encounter: Secondary | ICD-10-CM | POA: Diagnosis present

## 2017-09-23 DIAGNOSIS — S60221A Contusion of right hand, initial encounter: Secondary | ICD-10-CM | POA: Diagnosis not present

## 2017-09-23 DIAGNOSIS — I10 Essential (primary) hypertension: Secondary | ICD-10-CM | POA: Insufficient documentation

## 2017-09-23 DIAGNOSIS — S6991XA Unspecified injury of right wrist, hand and finger(s), initial encounter: Secondary | ICD-10-CM | POA: Diagnosis not present

## 2017-09-23 DIAGNOSIS — S60511A Abrasion of right hand, initial encounter: Secondary | ICD-10-CM | POA: Diagnosis not present

## 2017-09-23 DIAGNOSIS — S0083XA Contusion of other part of head, initial encounter: Secondary | ICD-10-CM | POA: Diagnosis not present

## 2017-09-23 DIAGNOSIS — S80212A Abrasion, left knee, initial encounter: Secondary | ICD-10-CM | POA: Diagnosis not present

## 2017-09-23 DIAGNOSIS — W19XXXA Unspecified fall, initial encounter: Secondary | ICD-10-CM

## 2017-09-23 HISTORY — DX: Unspecified fracture of skull, initial encounter for closed fracture: S02.91XA

## 2017-09-23 NOTE — ED Triage Notes (Signed)
Patient tripped and fell on uneven concrete/brick this AM and has facial abrasions, left knee abrasion, and right hand injury.and. Patient is on Plavix. No bleeding at this time.

## 2017-09-25 NOTE — ED Provider Notes (Signed)
[facility] DEPT Provider Note   CSN: [id number] Arrival date & time: 09/23/17  1334     History   Chief Complaint Chief Complaint  Patient presents with  . Fall  . Facial Injury  . Knee Injury  . Hand Injury  . Oral Swelling    HPI Deborah Tucker is a 84 y.o. female.  HPI 84 year old female on Plavix presents the emergency department after tripping and falling today with a change in the texture of her walking surface.  She ended up with some mild facial trauma as well as a left knee abrasion and a right hand abrasion.  She is on Plavix.  She has no pain with range of motion of her major joints.  She is been ambulatory since the fall.  She spoke with her primary care doctor's office and because of her use of Plavix they recommended evaluation in the emergency department.  No trauma to her forehead.  No headache.  No altered mental status.  No weakness of her arms or legs.  No neck pain.  Majority of pain is located in her face.  No trismus or malocclusion   Past Medical History:  Diagnosis Date  . B12 deficiency   . Bullous pemphigoid   . Central retinal vein occlusion, left eye    Left eye blind  . Cerebrovascular disease, unspecified    MRI indicates multiple small cortical infarcts.  . Concussion    H/o fall with Skull fracture & concussion  . DJD (degenerative joint disease), lumbar   . Glaucoma, right eye   . Hyperlipidemia   . IFG (impaired fasting glucose)   . Migraine headache   . OSA (obstructive sleep apnea)    Intolerant of CPAP. Is supposed to wear nocturnal oxygen, but does not  . Osteoarthritis of left knee   . Osteopenia   . Scoliosis deformity of spine   . Seasonal allergies   . Skull fracture ([place])   . Wrist fracture 2015   R wrist    Patient Active Problem List   Diagnosis Date Noted  . Atypical chest pain 10/08/2016  . Fatigue 10/05/2016  . Dyspnea on exertion 10/05/2016  . Family history of coronary artery  disease 10/05/2016  . Essential hypertension 10/05/2016  . Dyslipidemia, goal to be determined 10/05/2016  . Exercise intolerance 10/05/2016    Past Surgical History:  Procedure Laterality Date  . ANKLE FRACTURE SURGERY Left   . NM MYOVIEW LTD  10/2016   Only exercised 3: 34 min.  4.6 METs normal heart rate response.  Baseline hypertension.  Normal EF  65-70%.  No regional wall motion.  No ischemia or infarction.  Poor exercise tolerance.  No chronotropic incompetence.  . OVARIAN CYST REMOVAL    . VAGINAL HYSTERECTOMY       OB History   None      Home Medications    Prior to Admission medications   Medication Sig Start Date End Date Taking? Authorizing Provider  Ascorbic Acid (VITAMIN C) 100 MG tablet Take 100 mg by mouth daily.    [provider]  cholecalciferol (VITAMIN D) 1000 units tablet Take 1,000 Units by mouth daily.    [provider]  clopidogrel (PLAVIX) 75 MG tablet Take 75 mg by mouth daily.    [provider]  colesevelam (WELCHOL) 625 MG tablet Take 4 tablets by mouth daily.    [provider]  estrogens, conjugated, (PREMARIN) 0.3 MG tablet Take 0.3 mg by mouth  daily. Take daily for 21 days then do not take for 7 days.    [provider]  ezetimibe (ZETIA) 10 MG tablet Take 10 mg by mouth daily.    [provider]  fexofenadine (ALLEGRA) 30 MG tablet Take 30 mg by mouth daily.    [provider]  latanoprost (XALATAN) 0.005 % ophthalmic solution Place 1 drop into both eyes daily. 05/26/15   [provider]  levothyroxine (SYNTHROID, LEVOTHROID) 50 MCG tablet Take 1 tablet by mouth daily.    [provider]  metoprolol succinate (TOPROL-XL) 50 MG 24 hr tablet Take 25 mg by mouth 2 (two) times daily.    [provider]  Multiple Vitamins-Minerals (ICAPS AREDS 2) CAPS Take 1 capsule by mouth 2 (two) times daily.    [provider]  olmesartan (BENICAR) 20 MG tablet Take  20 mg by mouth daily.    [provider]  Omega-3 Fatty Acids (FISH OIL) 1000 MG CAPS Take 1 capsule by mouth daily.    [provider]  pantoprazole (PROTONIX) 40 MG tablet Take 40 mg by mouth daily.    [provider]  Pitavastatin Calcium (LIVALO) 4 MG TABS Take 2 mg by mouth daily.    [provider]  timolol (TIMOPTIC) 0.5 % ophthalmic solution Place 1 drop into both eyes 2 (two) times daily. 06/09/15   [provider]  vitamin B-12 (CYANOCOBALAMIN) 100 MCG tablet Take 100 mcg by mouth daily.    [provider]    Family History Family History  Problem Relation Age of Onset  . Lung cancer Father   . Heart disease Father   . Hypertension Father   . COPD Father   . Dementia Sister   . Seizures Sister   . Heart attack Sister 55       Damage was to the back of her heart.  Marland Kitchen AAA (abdominal aortic aneurysm) Sister   . Hypertension Daughter   . Hypertension Sister   . Diabetes type II Sister   . Dementia Maternal Grandmother     Social History Social History   Tobacco Use  . Smoking status: Never Smoker  . Smokeless tobacco: Never Used  Substance Use Topics  . Alcohol use: Yes    Alcohol/week: 2.0 standard drinks    Types: 2 Glasses of wine per week    Comment: 1-2 glasses  . Drug use: No     Allergies   Penicillins   Review of Systems Review of Systems  All other systems reviewed and are negative.    Physical Exam Updated Vital Signs BP (!) 161/87 (BP Location: Left Arm)   Pulse 73   Temp 98.1 F (36.7 C) (Oral)   Resp 18   Ht 5\' 3"  (1.6 m)   Wt 70.8 kg   SpO2 97%   BMI 27.63 kg/m   Physical Exam  Constitutional: She is oriented to person, place, and time. She appears well-developed and well-nourished.  HENT:  Head: Normocephalic.  Mild swelling of her nose and right anterior cheek overlying the maxillary sinus without focal tenderness.  Extraocular movements are intact.  No significant periorbital  swelling.  No trismus or malocclusion.  No trauma or hematoma to the scalp or forehead.  Eyes: EOM are normal.  Neck: Normal range of motion.  Pulmonary/Chest: Effort normal.  Abdominal: She exhibits no distension.  Musculoskeletal: Normal range of motion.  Full range of motion of bilateral shoulders, elbows, wrist.  Full range of motion  bilateral hips, knees, ankles.  Abrasions to the volar surface of the right hand without active bleeding.  Abrasion to the left knee without active bleeding.  Full range of motion of the left knee  Neurological: She is alert and oriented to person, place, and time.  Psychiatric: She has a normal mood and affect.  Nursing note and vitals reviewed.    ED Treatments / Results  Labs (all labs ordered are listed, but only abnormal results are displayed) Labs Reviewed - No data to display  EKG None  Radiology Dg Wrist Complete Right  Result Date: 09/23/2017 CLINICAL DATA:  Fall, RIGHT wrist injury. EXAM: RIGHT WRIST - COMPLETE 3+ VIEW COMPARISON:  None. FINDINGS: No osseous fracture or dislocation seen. No significant degenerative change. Adjacent soft tissues are unremarkable. IMPRESSION: No acute findings.  No osseous fracture or dislocation. Electronically Signed   By: Franki Cabot M.D.   On: 09/23/2017 14:56   Dg Knee Complete 4 Views Left  Result Date: 09/23/2017 CLINICAL DATA:  Fall, LEFT knee abrasion. EXAM: LEFT KNEE - COMPLETE 4+ VIEW COMPARISON:  None. FINDINGS: No evidence of fracture, dislocation, or joint effusion. No evidence of arthropathy or other focal bone abnormality. Soft tissues are unremarkable. IMPRESSION: Negative. Electronically Signed   By: Franki Cabot M.D.   On: 09/23/2017 14:56   Dg Hand Complete Right  Result Date: 09/23/2017 CLINICAL DATA:  Fall, abrasions, RIGHT hand/wrist injury. EXAM: RIGHT HAND - COMPLETE 3+ VIEW COMPARISON:  None. FINDINGS: There is diffuse osteopenia which limits characterization of osseous detail,  however, there is no fracture line or displaced fracture fragment seen. No osteophytes or other signs of advanced DJD. Soft tissues about the RIGHT hand are unremarkable. IMPRESSION: No acute findings.  No osseous fracture or dislocation. Electronically Signed   By: Franki Cabot M.D.   On: 09/23/2017 14:55    Procedures Procedures (including critical care time)  Medications Ordered in ED Medications - No data to display   Initial Impression / Assessment and Plan / ED Course  I have reviewed the triage vital signs and the nursing notes.  Pertinent labs & imaging results that were available during my care of the patient were reviewed by me and considered in my medical decision making (see chart for details).     Low energy mechanism.  Patient is on Plavix without neurologic complaints or headache at this time.  I do not think she needs CT imaging.  She did have some trauma to her face but this fall was somewhat broken by her right hand and left knee.  Head injury warnings given.  Family to observe the patient closely at home.  She has a friends home [place] where they will also care for her.  She has 2 good friends at her independent living facility that are retired Water engineer.  She is alert and oriented.  Final Clinical Impressions(s) / ED Diagnoses   Final diagnoses:  Fall, initial encounter  Contusion of face, initial encounter  Contusion of right hand, initial encounter  Noninfected skin tear of left lower extremity, initial encounter    ED Discharge Orders    None       Jola Schmidt, MD 09/25/17 [phone]

## 2017-09-27 DIAGNOSIS — J301 Allergic rhinitis due to pollen: Secondary | ICD-10-CM | POA: Diagnosis not present

## 2017-09-30 DIAGNOSIS — J301 Allergic rhinitis due to pollen: Secondary | ICD-10-CM | POA: Diagnosis not present

## 2017-10-03 DIAGNOSIS — J301 Allergic rhinitis due to pollen: Secondary | ICD-10-CM | POA: Diagnosis not present

## 2017-10-08 DIAGNOSIS — Z23 Encounter for immunization: Secondary | ICD-10-CM | POA: Diagnosis not present

## 2017-10-11 DIAGNOSIS — J301 Allergic rhinitis due to pollen: Secondary | ICD-10-CM | POA: Diagnosis not present

## 2017-10-14 DIAGNOSIS — I10 Essential (primary) hypertension: Secondary | ICD-10-CM | POA: Diagnosis not present

## 2017-10-14 DIAGNOSIS — M859 Disorder of bone density and structure, unspecified: Secondary | ICD-10-CM | POA: Diagnosis not present

## 2017-10-14 DIAGNOSIS — E038 Other specified hypothyroidism: Secondary | ICD-10-CM | POA: Diagnosis not present

## 2017-10-14 DIAGNOSIS — W19XXXA Unspecified fall, initial encounter: Secondary | ICD-10-CM | POA: Diagnosis not present

## 2017-10-14 DIAGNOSIS — L120 Bullous pemphigoid: Secondary | ICD-10-CM | POA: Diagnosis not present

## 2017-10-14 DIAGNOSIS — Z6828 Body mass index (BMI) 28.0-28.9, adult: Secondary | ICD-10-CM | POA: Diagnosis not present

## 2017-10-17 DIAGNOSIS — F411 Generalized anxiety disorder: Secondary | ICD-10-CM | POA: Diagnosis not present

## 2017-10-17 DIAGNOSIS — J301 Allergic rhinitis due to pollen: Secondary | ICD-10-CM | POA: Diagnosis not present

## 2017-10-21 DIAGNOSIS — H401124 Primary open-angle glaucoma, left eye, indeterminate stage: Secondary | ICD-10-CM | POA: Diagnosis not present

## 2017-10-21 DIAGNOSIS — H401112 Primary open-angle glaucoma, right eye, moderate stage: Secondary | ICD-10-CM | POA: Diagnosis not present

## 2017-10-27 DIAGNOSIS — J301 Allergic rhinitis due to pollen: Secondary | ICD-10-CM | POA: Diagnosis not present

## 2017-11-07 DIAGNOSIS — J301 Allergic rhinitis due to pollen: Secondary | ICD-10-CM | POA: Diagnosis not present

## 2017-11-09 DIAGNOSIS — R05 Cough: Secondary | ICD-10-CM | POA: Diagnosis not present

## 2017-11-09 DIAGNOSIS — J301 Allergic rhinitis due to pollen: Secondary | ICD-10-CM | POA: Diagnosis not present

## 2017-11-14 DIAGNOSIS — F411 Generalized anxiety disorder: Secondary | ICD-10-CM | POA: Diagnosis not present

## 2017-11-14 DIAGNOSIS — J301 Allergic rhinitis due to pollen: Secondary | ICD-10-CM | POA: Diagnosis not present

## 2017-11-18 DIAGNOSIS — J301 Allergic rhinitis due to pollen: Secondary | ICD-10-CM | POA: Diagnosis not present

## 2017-11-25 DIAGNOSIS — J301 Allergic rhinitis due to pollen: Secondary | ICD-10-CM | POA: Diagnosis not present

## 2017-12-06 DIAGNOSIS — J301 Allergic rhinitis due to pollen: Secondary | ICD-10-CM | POA: Diagnosis not present

## 2017-12-16 DIAGNOSIS — J301 Allergic rhinitis due to pollen: Secondary | ICD-10-CM | POA: Diagnosis not present

## 2017-12-16 DIAGNOSIS — F411 Generalized anxiety disorder: Secondary | ICD-10-CM | POA: Diagnosis not present

## 2017-12-26 DIAGNOSIS — J301 Allergic rhinitis due to pollen: Secondary | ICD-10-CM | POA: Diagnosis not present

## 2018-01-06 DIAGNOSIS — J301 Allergic rhinitis due to pollen: Secondary | ICD-10-CM | POA: Diagnosis not present

## 2018-01-16 DIAGNOSIS — J301 Allergic rhinitis due to pollen: Secondary | ICD-10-CM | POA: Diagnosis not present

## 2018-01-20 DIAGNOSIS — F411 Generalized anxiety disorder: Secondary | ICD-10-CM | POA: Diagnosis not present

## 2018-01-30 DIAGNOSIS — J3089 Other allergic rhinitis: Secondary | ICD-10-CM | POA: Diagnosis not present

## 2018-01-30 DIAGNOSIS — J301 Allergic rhinitis due to pollen: Secondary | ICD-10-CM | POA: Diagnosis not present

## 2018-02-10 DIAGNOSIS — D519 Vitamin B12 deficiency anemia, unspecified: Secondary | ICD-10-CM | POA: Diagnosis not present

## 2018-02-10 DIAGNOSIS — J301 Allergic rhinitis due to pollen: Secondary | ICD-10-CM | POA: Diagnosis not present

## 2018-02-10 DIAGNOSIS — M859 Disorder of bone density and structure, unspecified: Secondary | ICD-10-CM | POA: Diagnosis not present

## 2018-02-10 DIAGNOSIS — I10 Essential (primary) hypertension: Secondary | ICD-10-CM | POA: Diagnosis not present

## 2018-02-10 DIAGNOSIS — R82998 Other abnormal findings in urine: Secondary | ICD-10-CM | POA: Diagnosis not present

## 2018-02-10 DIAGNOSIS — E038 Other specified hypothyroidism: Secondary | ICD-10-CM | POA: Diagnosis not present

## 2018-02-10 DIAGNOSIS — D518 Other vitamin B12 deficiency anemias: Secondary | ICD-10-CM | POA: Diagnosis not present

## 2018-02-10 DIAGNOSIS — E7849 Other hyperlipidemia: Secondary | ICD-10-CM | POA: Diagnosis not present

## 2018-02-10 DIAGNOSIS — R7301 Impaired fasting glucose: Secondary | ICD-10-CM | POA: Diagnosis not present

## 2018-02-13 DIAGNOSIS — F411 Generalized anxiety disorder: Secondary | ICD-10-CM | POA: Diagnosis not present

## 2018-02-17 DIAGNOSIS — I2510 Atherosclerotic heart disease of native coronary artery without angina pectoris: Secondary | ICD-10-CM | POA: Diagnosis not present

## 2018-02-17 DIAGNOSIS — H348192 Central retinal vein occlusion, unspecified eye, stable: Secondary | ICD-10-CM | POA: Diagnosis not present

## 2018-02-17 DIAGNOSIS — Z1331 Encounter for screening for depression: Secondary | ICD-10-CM | POA: Diagnosis not present

## 2018-02-17 DIAGNOSIS — Z0000 Encounter for general adult medical examination without abnormal findings: Secondary | ICD-10-CM | POA: Diagnosis not present

## 2018-02-17 DIAGNOSIS — R5383 Other fatigue: Secondary | ICD-10-CM | POA: Diagnosis not present

## 2018-02-17 DIAGNOSIS — I6389 Other cerebral infarction: Secondary | ICD-10-CM | POA: Diagnosis not present

## 2018-02-17 DIAGNOSIS — M25572 Pain in left ankle and joints of left foot: Secondary | ICD-10-CM | POA: Diagnosis not present

## 2018-02-17 DIAGNOSIS — D518 Other vitamin B12 deficiency anemias: Secondary | ICD-10-CM | POA: Diagnosis not present

## 2018-02-17 DIAGNOSIS — Z6827 Body mass index (BMI) 27.0-27.9, adult: Secondary | ICD-10-CM | POA: Diagnosis not present

## 2018-02-17 DIAGNOSIS — J309 Allergic rhinitis, unspecified: Secondary | ICD-10-CM | POA: Diagnosis not present

## 2018-02-17 DIAGNOSIS — M5489 Other dorsalgia: Secondary | ICD-10-CM | POA: Diagnosis not present

## 2018-02-17 DIAGNOSIS — Z1339 Encounter for screening examination for other mental health and behavioral disorders: Secondary | ICD-10-CM | POA: Diagnosis not present

## 2018-02-21 DIAGNOSIS — J301 Allergic rhinitis due to pollen: Secondary | ICD-10-CM | POA: Diagnosis not present

## 2018-02-22 DIAGNOSIS — Z1212 Encounter for screening for malignant neoplasm of rectum: Secondary | ICD-10-CM | POA: Diagnosis not present

## 2018-03-02 ENCOUNTER — Other Ambulatory Visit: Payer: Self-pay | Admitting: Internal Medicine

## 2018-03-02 ENCOUNTER — Ambulatory Visit (HOSPITAL_COMMUNITY)
Admission: RE | Admit: 2018-03-02 | Discharge: 2018-03-02 | Disposition: A | Discharge status: Home / Self Care | Payer: Medicare Other | Source: Ambulatory Visit | Attending: Internal Medicine | Admitting: Internal Medicine

## 2018-03-02 ENCOUNTER — Other Ambulatory Visit (HOSPITAL_COMMUNITY): Payer: Self-pay | Admitting: Internal Medicine

## 2018-03-02 DIAGNOSIS — I639 Cerebral infarction, unspecified: Secondary | ICD-10-CM | POA: Diagnosis not present

## 2018-03-03 DIAGNOSIS — R5383 Other fatigue: Secondary | ICD-10-CM | POA: Diagnosis not present

## 2018-03-03 DIAGNOSIS — R4182 Altered mental status, unspecified: Secondary | ICD-10-CM | POA: Diagnosis not present

## 2018-03-03 DIAGNOSIS — Z6827 Body mass index (BMI) 27.0-27.9, adult: Secondary | ICD-10-CM | POA: Diagnosis not present

## 2018-03-03 DIAGNOSIS — I10 Essential (primary) hypertension: Secondary | ICD-10-CM | POA: Diagnosis not present

## 2018-03-03 DIAGNOSIS — D509 Iron deficiency anemia, unspecified: Secondary | ICD-10-CM | POA: Diagnosis not present

## 2018-03-03 DIAGNOSIS — E871 Hypo-osmolality and hyponatremia: Secondary | ICD-10-CM | POA: Diagnosis not present

## 2018-03-03 DIAGNOSIS — D519 Vitamin B12 deficiency anemia, unspecified: Secondary | ICD-10-CM | POA: Diagnosis not present

## 2018-03-06 DIAGNOSIS — Z90710 Acquired absence of both cervix and uterus: Secondary | ICD-10-CM | POA: Diagnosis not present

## 2018-03-06 DIAGNOSIS — E871 Hypo-osmolality and hyponatremia: Secondary | ICD-10-CM | POA: Diagnosis not present

## 2018-03-06 DIAGNOSIS — R2681 Unsteadiness on feet: Secondary | ICD-10-CM | POA: Diagnosis not present

## 2018-03-06 DIAGNOSIS — Z6827 Body mass index (BMI) 27.0-27.9, adult: Secondary | ICD-10-CM | POA: Diagnosis not present

## 2018-03-06 DIAGNOSIS — M6281 Muscle weakness (generalized): Secondary | ICD-10-CM | POA: Diagnosis not present

## 2018-03-06 DIAGNOSIS — Z0289 Encounter for other administrative examinations: Secondary | ICD-10-CM | POA: Diagnosis not present

## 2018-03-06 DIAGNOSIS — R4182 Altered mental status, unspecified: Secondary | ICD-10-CM | POA: Diagnosis not present

## 2018-03-06 DIAGNOSIS — I10 Essential (primary) hypertension: Secondary | ICD-10-CM | POA: Diagnosis not present

## 2018-03-06 DIAGNOSIS — E785 Hyperlipidemia, unspecified: Secondary | ICD-10-CM | POA: Diagnosis not present

## 2018-03-06 DIAGNOSIS — E039 Hypothyroidism, unspecified: Secondary | ICD-10-CM | POA: Diagnosis not present

## 2018-03-08 DIAGNOSIS — Z90710 Acquired absence of both cervix and uterus: Secondary | ICD-10-CM | POA: Diagnosis not present

## 2018-03-08 DIAGNOSIS — E785 Hyperlipidemia, unspecified: Secondary | ICD-10-CM | POA: Diagnosis not present

## 2018-03-08 DIAGNOSIS — M6281 Muscle weakness (generalized): Secondary | ICD-10-CM | POA: Diagnosis not present

## 2018-03-08 DIAGNOSIS — E039 Hypothyroidism, unspecified: Secondary | ICD-10-CM | POA: Diagnosis not present

## 2018-03-08 DIAGNOSIS — R2681 Unsteadiness on feet: Secondary | ICD-10-CM | POA: Diagnosis not present

## 2018-03-09 DIAGNOSIS — M6281 Muscle weakness (generalized): Secondary | ICD-10-CM | POA: Diagnosis not present

## 2018-03-09 DIAGNOSIS — R2681 Unsteadiness on feet: Secondary | ICD-10-CM | POA: Diagnosis not present

## 2018-03-09 DIAGNOSIS — E785 Hyperlipidemia, unspecified: Secondary | ICD-10-CM | POA: Diagnosis not present

## 2018-03-09 DIAGNOSIS — E039 Hypothyroidism, unspecified: Secondary | ICD-10-CM | POA: Diagnosis not present

## 2018-03-09 DIAGNOSIS — Z90710 Acquired absence of both cervix and uterus: Secondary | ICD-10-CM | POA: Diagnosis not present

## 2018-03-10 DIAGNOSIS — E871 Hypo-osmolality and hyponatremia: Secondary | ICD-10-CM | POA: Diagnosis not present

## 2018-03-13 DIAGNOSIS — R41841 Cognitive communication deficit: Secondary | ICD-10-CM | POA: Diagnosis not present

## 2018-03-13 DIAGNOSIS — Z90710 Acquired absence of both cervix and uterus: Secondary | ICD-10-CM | POA: Diagnosis not present

## 2018-03-13 DIAGNOSIS — E039 Hypothyroidism, unspecified: Secondary | ICD-10-CM | POA: Diagnosis not present

## 2018-03-13 DIAGNOSIS — M6281 Muscle weakness (generalized): Secondary | ICD-10-CM | POA: Diagnosis not present

## 2018-03-13 DIAGNOSIS — R2681 Unsteadiness on feet: Secondary | ICD-10-CM | POA: Diagnosis not present

## 2018-03-15 DIAGNOSIS — R41841 Cognitive communication deficit: Secondary | ICD-10-CM | POA: Diagnosis not present

## 2018-03-15 DIAGNOSIS — E039 Hypothyroidism, unspecified: Secondary | ICD-10-CM | POA: Diagnosis not present

## 2018-03-15 DIAGNOSIS — M6281 Muscle weakness (generalized): Secondary | ICD-10-CM | POA: Diagnosis not present

## 2018-03-15 DIAGNOSIS — Z90710 Acquired absence of both cervix and uterus: Secondary | ICD-10-CM | POA: Diagnosis not present

## 2018-03-15 DIAGNOSIS — R2681 Unsteadiness on feet: Secondary | ICD-10-CM | POA: Diagnosis not present

## 2018-03-17 DIAGNOSIS — R2681 Unsteadiness on feet: Secondary | ICD-10-CM | POA: Diagnosis not present

## 2018-03-17 DIAGNOSIS — M6281 Muscle weakness (generalized): Secondary | ICD-10-CM | POA: Diagnosis not present

## 2018-03-17 DIAGNOSIS — E039 Hypothyroidism, unspecified: Secondary | ICD-10-CM | POA: Diagnosis not present

## 2018-03-17 DIAGNOSIS — R41841 Cognitive communication deficit: Secondary | ICD-10-CM | POA: Diagnosis not present

## 2018-03-17 DIAGNOSIS — Z90710 Acquired absence of both cervix and uterus: Secondary | ICD-10-CM | POA: Diagnosis not present

## 2018-03-20 DIAGNOSIS — Z90710 Acquired absence of both cervix and uterus: Secondary | ICD-10-CM | POA: Diagnosis not present

## 2018-03-20 DIAGNOSIS — E039 Hypothyroidism, unspecified: Secondary | ICD-10-CM | POA: Diagnosis not present

## 2018-03-20 DIAGNOSIS — M6281 Muscle weakness (generalized): Secondary | ICD-10-CM | POA: Diagnosis not present

## 2018-03-20 DIAGNOSIS — R2681 Unsteadiness on feet: Secondary | ICD-10-CM | POA: Diagnosis not present

## 2018-03-20 DIAGNOSIS — R41841 Cognitive communication deficit: Secondary | ICD-10-CM | POA: Diagnosis not present

## 2018-03-22 DIAGNOSIS — E039 Hypothyroidism, unspecified: Secondary | ICD-10-CM | POA: Diagnosis not present

## 2018-03-22 DIAGNOSIS — N182 Chronic kidney disease, stage 2 (mild): Secondary | ICD-10-CM | POA: Diagnosis not present

## 2018-03-22 DIAGNOSIS — I129 Hypertensive chronic kidney disease with stage 1 through stage 4 chronic kidney disease, or unspecified chronic kidney disease: Secondary | ICD-10-CM | POA: Diagnosis not present

## 2018-03-22 DIAGNOSIS — Z6826 Body mass index (BMI) 26.0-26.9, adult: Secondary | ICD-10-CM | POA: Diagnosis not present

## 2018-03-22 DIAGNOSIS — E871 Hypo-osmolality and hyponatremia: Secondary | ICD-10-CM | POA: Diagnosis not present

## 2018-03-22 DIAGNOSIS — F329 Major depressive disorder, single episode, unspecified: Secondary | ICD-10-CM | POA: Diagnosis not present

## 2018-03-22 DIAGNOSIS — R413 Other amnesia: Secondary | ICD-10-CM | POA: Diagnosis not present

## 2018-03-22 DIAGNOSIS — J301 Allergic rhinitis due to pollen: Secondary | ICD-10-CM | POA: Diagnosis not present

## 2018-03-24 DIAGNOSIS — R2681 Unsteadiness on feet: Secondary | ICD-10-CM | POA: Diagnosis not present

## 2018-03-24 DIAGNOSIS — R41841 Cognitive communication deficit: Secondary | ICD-10-CM | POA: Diagnosis not present

## 2018-03-24 DIAGNOSIS — E039 Hypothyroidism, unspecified: Secondary | ICD-10-CM | POA: Diagnosis not present

## 2018-03-24 DIAGNOSIS — Z90710 Acquired absence of both cervix and uterus: Secondary | ICD-10-CM | POA: Diagnosis not present

## 2018-03-24 DIAGNOSIS — M6281 Muscle weakness (generalized): Secondary | ICD-10-CM | POA: Diagnosis not present

## 2018-03-27 DIAGNOSIS — R41841 Cognitive communication deficit: Secondary | ICD-10-CM | POA: Diagnosis not present

## 2018-03-27 DIAGNOSIS — M6281 Muscle weakness (generalized): Secondary | ICD-10-CM | POA: Diagnosis not present

## 2018-03-27 DIAGNOSIS — Z90710 Acquired absence of both cervix and uterus: Secondary | ICD-10-CM | POA: Diagnosis not present

## 2018-03-27 DIAGNOSIS — R2681 Unsteadiness on feet: Secondary | ICD-10-CM | POA: Diagnosis not present

## 2018-03-27 DIAGNOSIS — E039 Hypothyroidism, unspecified: Secondary | ICD-10-CM | POA: Diagnosis not present

## 2018-03-28 DIAGNOSIS — R2681 Unsteadiness on feet: Secondary | ICD-10-CM | POA: Diagnosis not present

## 2018-03-28 DIAGNOSIS — R41841 Cognitive communication deficit: Secondary | ICD-10-CM | POA: Diagnosis not present

## 2018-03-28 DIAGNOSIS — M6281 Muscle weakness (generalized): Secondary | ICD-10-CM | POA: Diagnosis not present

## 2018-03-28 DIAGNOSIS — E039 Hypothyroidism, unspecified: Secondary | ICD-10-CM | POA: Diagnosis not present

## 2018-03-28 DIAGNOSIS — Z90710 Acquired absence of both cervix and uterus: Secondary | ICD-10-CM | POA: Diagnosis not present

## 2018-03-31 DIAGNOSIS — R2681 Unsteadiness on feet: Secondary | ICD-10-CM | POA: Diagnosis not present

## 2018-03-31 DIAGNOSIS — M6281 Muscle weakness (generalized): Secondary | ICD-10-CM | POA: Diagnosis not present

## 2018-03-31 DIAGNOSIS — E039 Hypothyroidism, unspecified: Secondary | ICD-10-CM | POA: Diagnosis not present

## 2018-03-31 DIAGNOSIS — R41841 Cognitive communication deficit: Secondary | ICD-10-CM | POA: Diagnosis not present

## 2018-03-31 DIAGNOSIS — Z90710 Acquired absence of both cervix and uterus: Secondary | ICD-10-CM | POA: Diagnosis not present

## 2018-04-04 DIAGNOSIS — M6281 Muscle weakness (generalized): Secondary | ICD-10-CM | POA: Diagnosis not present

## 2018-04-04 DIAGNOSIS — E039 Hypothyroidism, unspecified: Secondary | ICD-10-CM | POA: Diagnosis not present

## 2018-04-04 DIAGNOSIS — R2681 Unsteadiness on feet: Secondary | ICD-10-CM | POA: Diagnosis not present

## 2018-04-04 DIAGNOSIS — Z90710 Acquired absence of both cervix and uterus: Secondary | ICD-10-CM | POA: Diagnosis not present

## 2018-04-04 DIAGNOSIS — R41841 Cognitive communication deficit: Secondary | ICD-10-CM | POA: Diagnosis not present

## 2018-04-05 DIAGNOSIS — M6281 Muscle weakness (generalized): Secondary | ICD-10-CM | POA: Diagnosis not present

## 2018-04-05 DIAGNOSIS — R41841 Cognitive communication deficit: Secondary | ICD-10-CM | POA: Diagnosis not present

## 2018-04-05 DIAGNOSIS — R2681 Unsteadiness on feet: Secondary | ICD-10-CM | POA: Diagnosis not present

## 2018-04-05 DIAGNOSIS — E039 Hypothyroidism, unspecified: Secondary | ICD-10-CM | POA: Diagnosis not present

## 2018-04-05 DIAGNOSIS — Z90710 Acquired absence of both cervix and uterus: Secondary | ICD-10-CM | POA: Diagnosis not present

## 2018-04-07 DIAGNOSIS — E039 Hypothyroidism, unspecified: Secondary | ICD-10-CM | POA: Diagnosis not present

## 2018-04-07 DIAGNOSIS — R41841 Cognitive communication deficit: Secondary | ICD-10-CM | POA: Diagnosis not present

## 2018-04-07 DIAGNOSIS — J301 Allergic rhinitis due to pollen: Secondary | ICD-10-CM | POA: Diagnosis not present

## 2018-04-07 DIAGNOSIS — N182 Chronic kidney disease, stage 2 (mild): Secondary | ICD-10-CM | POA: Diagnosis not present

## 2018-04-07 DIAGNOSIS — Z90710 Acquired absence of both cervix and uterus: Secondary | ICD-10-CM | POA: Diagnosis not present

## 2018-04-07 DIAGNOSIS — M6281 Muscle weakness (generalized): Secondary | ICD-10-CM | POA: Diagnosis not present

## 2018-04-07 DIAGNOSIS — R2681 Unsteadiness on feet: Secondary | ICD-10-CM | POA: Diagnosis not present

## 2018-04-13 DIAGNOSIS — Z90710 Acquired absence of both cervix and uterus: Secondary | ICD-10-CM | POA: Diagnosis not present

## 2018-04-13 DIAGNOSIS — E785 Hyperlipidemia, unspecified: Secondary | ICD-10-CM | POA: Diagnosis not present

## 2018-04-13 DIAGNOSIS — R2681 Unsteadiness on feet: Secondary | ICD-10-CM | POA: Diagnosis not present

## 2018-04-13 DIAGNOSIS — E039 Hypothyroidism, unspecified: Secondary | ICD-10-CM | POA: Diagnosis not present

## 2018-04-13 DIAGNOSIS — R41841 Cognitive communication deficit: Secondary | ICD-10-CM | POA: Diagnosis not present

## 2018-04-17 DIAGNOSIS — R41841 Cognitive communication deficit: Secondary | ICD-10-CM | POA: Diagnosis not present

## 2018-04-17 DIAGNOSIS — Z90710 Acquired absence of both cervix and uterus: Secondary | ICD-10-CM | POA: Diagnosis not present

## 2018-04-17 DIAGNOSIS — R2681 Unsteadiness on feet: Secondary | ICD-10-CM | POA: Diagnosis not present

## 2018-04-17 DIAGNOSIS — E785 Hyperlipidemia, unspecified: Secondary | ICD-10-CM | POA: Diagnosis not present

## 2018-04-17 DIAGNOSIS — E039 Hypothyroidism, unspecified: Secondary | ICD-10-CM | POA: Diagnosis not present

## 2018-04-18 DIAGNOSIS — J301 Allergic rhinitis due to pollen: Secondary | ICD-10-CM | POA: Diagnosis not present

## 2018-04-26 DIAGNOSIS — E039 Hypothyroidism, unspecified: Secondary | ICD-10-CM | POA: Diagnosis not present

## 2018-04-26 DIAGNOSIS — Z90710 Acquired absence of both cervix and uterus: Secondary | ICD-10-CM | POA: Diagnosis not present

## 2018-04-26 DIAGNOSIS — R41841 Cognitive communication deficit: Secondary | ICD-10-CM | POA: Diagnosis not present

## 2018-04-26 DIAGNOSIS — R2681 Unsteadiness on feet: Secondary | ICD-10-CM | POA: Diagnosis not present

## 2018-04-26 DIAGNOSIS — E785 Hyperlipidemia, unspecified: Secondary | ICD-10-CM | POA: Diagnosis not present

## 2018-04-27 DIAGNOSIS — N182 Chronic kidney disease, stage 2 (mild): Secondary | ICD-10-CM | POA: Diagnosis not present

## 2018-04-27 DIAGNOSIS — J301 Allergic rhinitis due to pollen: Secondary | ICD-10-CM | POA: Diagnosis not present

## 2018-04-28 DIAGNOSIS — N182 Chronic kidney disease, stage 2 (mild): Secondary | ICD-10-CM | POA: Diagnosis not present

## 2018-04-28 DIAGNOSIS — F329 Major depressive disorder, single episode, unspecified: Secondary | ICD-10-CM | POA: Diagnosis not present

## 2018-04-28 DIAGNOSIS — E871 Hypo-osmolality and hyponatremia: Secondary | ICD-10-CM | POA: Diagnosis not present

## 2018-04-28 DIAGNOSIS — I129 Hypertensive chronic kidney disease with stage 1 through stage 4 chronic kidney disease, or unspecified chronic kidney disease: Secondary | ICD-10-CM | POA: Diagnosis not present

## 2018-05-02 DIAGNOSIS — E785 Hyperlipidemia, unspecified: Secondary | ICD-10-CM | POA: Diagnosis not present

## 2018-05-02 DIAGNOSIS — Z90710 Acquired absence of both cervix and uterus: Secondary | ICD-10-CM | POA: Diagnosis not present

## 2018-05-02 DIAGNOSIS — E039 Hypothyroidism, unspecified: Secondary | ICD-10-CM | POA: Diagnosis not present

## 2018-05-02 DIAGNOSIS — R41841 Cognitive communication deficit: Secondary | ICD-10-CM | POA: Diagnosis not present

## 2018-05-02 DIAGNOSIS — R2681 Unsteadiness on feet: Secondary | ICD-10-CM | POA: Diagnosis not present

## 2018-05-05 DIAGNOSIS — J301 Allergic rhinitis due to pollen: Secondary | ICD-10-CM | POA: Diagnosis not present

## 2018-05-09 DIAGNOSIS — J301 Allergic rhinitis due to pollen: Secondary | ICD-10-CM | POA: Diagnosis not present

## 2018-05-12 DIAGNOSIS — E785 Hyperlipidemia, unspecified: Secondary | ICD-10-CM | POA: Diagnosis not present

## 2018-05-12 DIAGNOSIS — R41841 Cognitive communication deficit: Secondary | ICD-10-CM | POA: Diagnosis not present

## 2018-05-12 DIAGNOSIS — E039 Hypothyroidism, unspecified: Secondary | ICD-10-CM | POA: Diagnosis not present

## 2018-05-12 DIAGNOSIS — Z90710 Acquired absence of both cervix and uterus: Secondary | ICD-10-CM | POA: Diagnosis not present

## 2018-05-12 DIAGNOSIS — R2681 Unsteadiness on feet: Secondary | ICD-10-CM | POA: Diagnosis not present

## 2018-05-15 DIAGNOSIS — R41841 Cognitive communication deficit: Secondary | ICD-10-CM | POA: Diagnosis not present

## 2018-05-15 DIAGNOSIS — E785 Hyperlipidemia, unspecified: Secondary | ICD-10-CM | POA: Diagnosis not present

## 2018-05-15 DIAGNOSIS — R2681 Unsteadiness on feet: Secondary | ICD-10-CM | POA: Diagnosis not present

## 2018-05-15 DIAGNOSIS — Z90710 Acquired absence of both cervix and uterus: Secondary | ICD-10-CM | POA: Diagnosis not present

## 2018-05-15 DIAGNOSIS — E039 Hypothyroidism, unspecified: Secondary | ICD-10-CM | POA: Diagnosis not present

## 2018-05-17 DIAGNOSIS — E039 Hypothyroidism, unspecified: Secondary | ICD-10-CM | POA: Diagnosis not present

## 2018-05-17 DIAGNOSIS — R41841 Cognitive communication deficit: Secondary | ICD-10-CM | POA: Diagnosis not present

## 2018-05-17 DIAGNOSIS — Z90710 Acquired absence of both cervix and uterus: Secondary | ICD-10-CM | POA: Diagnosis not present

## 2018-05-17 DIAGNOSIS — R2681 Unsteadiness on feet: Secondary | ICD-10-CM | POA: Diagnosis not present

## 2018-05-17 DIAGNOSIS — E785 Hyperlipidemia, unspecified: Secondary | ICD-10-CM | POA: Diagnosis not present

## 2018-05-22 DIAGNOSIS — Z90710 Acquired absence of both cervix and uterus: Secondary | ICD-10-CM | POA: Diagnosis not present

## 2018-05-22 DIAGNOSIS — R41841 Cognitive communication deficit: Secondary | ICD-10-CM | POA: Diagnosis not present

## 2018-05-22 DIAGNOSIS — E039 Hypothyroidism, unspecified: Secondary | ICD-10-CM | POA: Diagnosis not present

## 2018-05-22 DIAGNOSIS — R2681 Unsteadiness on feet: Secondary | ICD-10-CM | POA: Diagnosis not present

## 2018-05-22 DIAGNOSIS — E785 Hyperlipidemia, unspecified: Secondary | ICD-10-CM | POA: Diagnosis not present

## 2018-05-24 DIAGNOSIS — Z90710 Acquired absence of both cervix and uterus: Secondary | ICD-10-CM | POA: Diagnosis not present

## 2018-05-24 DIAGNOSIS — R41841 Cognitive communication deficit: Secondary | ICD-10-CM | POA: Diagnosis not present

## 2018-05-24 DIAGNOSIS — E785 Hyperlipidemia, unspecified: Secondary | ICD-10-CM | POA: Diagnosis not present

## 2018-05-24 DIAGNOSIS — E039 Hypothyroidism, unspecified: Secondary | ICD-10-CM | POA: Diagnosis not present

## 2018-05-24 DIAGNOSIS — R2681 Unsteadiness on feet: Secondary | ICD-10-CM | POA: Diagnosis not present

## 2018-05-31 DIAGNOSIS — R41841 Cognitive communication deficit: Secondary | ICD-10-CM | POA: Diagnosis not present

## 2018-05-31 DIAGNOSIS — Z90710 Acquired absence of both cervix and uterus: Secondary | ICD-10-CM | POA: Diagnosis not present

## 2018-05-31 DIAGNOSIS — E039 Hypothyroidism, unspecified: Secondary | ICD-10-CM | POA: Diagnosis not present

## 2018-05-31 DIAGNOSIS — E785 Hyperlipidemia, unspecified: Secondary | ICD-10-CM | POA: Diagnosis not present

## 2018-05-31 DIAGNOSIS — R2681 Unsteadiness on feet: Secondary | ICD-10-CM | POA: Diagnosis not present

## 2018-06-01 DIAGNOSIS — E039 Hypothyroidism, unspecified: Secondary | ICD-10-CM | POA: Diagnosis not present

## 2018-06-01 DIAGNOSIS — R41841 Cognitive communication deficit: Secondary | ICD-10-CM | POA: Diagnosis not present

## 2018-06-01 DIAGNOSIS — Z90710 Acquired absence of both cervix and uterus: Secondary | ICD-10-CM | POA: Diagnosis not present

## 2018-06-01 DIAGNOSIS — R2681 Unsteadiness on feet: Secondary | ICD-10-CM | POA: Diagnosis not present

## 2018-06-01 DIAGNOSIS — E785 Hyperlipidemia, unspecified: Secondary | ICD-10-CM | POA: Diagnosis not present

## 2018-06-05 DIAGNOSIS — Z90710 Acquired absence of both cervix and uterus: Secondary | ICD-10-CM | POA: Diagnosis not present

## 2018-06-05 DIAGNOSIS — R41841 Cognitive communication deficit: Secondary | ICD-10-CM | POA: Diagnosis not present

## 2018-06-05 DIAGNOSIS — R2681 Unsteadiness on feet: Secondary | ICD-10-CM | POA: Diagnosis not present

## 2018-06-05 DIAGNOSIS — E785 Hyperlipidemia, unspecified: Secondary | ICD-10-CM | POA: Diagnosis not present

## 2018-06-05 DIAGNOSIS — E039 Hypothyroidism, unspecified: Secondary | ICD-10-CM | POA: Diagnosis not present

## 2018-06-06 DIAGNOSIS — L602 Onychogryphosis: Secondary | ICD-10-CM | POA: Diagnosis not present

## 2018-06-06 DIAGNOSIS — L84 Corns and callosities: Secondary | ICD-10-CM | POA: Diagnosis not present

## 2018-06-06 DIAGNOSIS — Q6689 Other  specified congenital deformities of feet: Secondary | ICD-10-CM | POA: Diagnosis not present

## 2018-06-08 DIAGNOSIS — Z90710 Acquired absence of both cervix and uterus: Secondary | ICD-10-CM | POA: Diagnosis not present

## 2018-06-08 DIAGNOSIS — E785 Hyperlipidemia, unspecified: Secondary | ICD-10-CM | POA: Diagnosis not present

## 2018-06-08 DIAGNOSIS — R41841 Cognitive communication deficit: Secondary | ICD-10-CM | POA: Diagnosis not present

## 2018-06-08 DIAGNOSIS — R2681 Unsteadiness on feet: Secondary | ICD-10-CM | POA: Diagnosis not present

## 2018-06-08 DIAGNOSIS — E039 Hypothyroidism, unspecified: Secondary | ICD-10-CM | POA: Diagnosis not present

## 2018-06-12 DIAGNOSIS — R2681 Unsteadiness on feet: Secondary | ICD-10-CM | POA: Diagnosis not present

## 2018-06-12 DIAGNOSIS — R41841 Cognitive communication deficit: Secondary | ICD-10-CM | POA: Diagnosis not present

## 2018-06-12 DIAGNOSIS — M6281 Muscle weakness (generalized): Secondary | ICD-10-CM | POA: Diagnosis not present

## 2018-06-12 DIAGNOSIS — R2689 Other abnormalities of gait and mobility: Secondary | ICD-10-CM | POA: Diagnosis not present

## 2018-06-12 DIAGNOSIS — Z9181 History of falling: Secondary | ICD-10-CM | POA: Diagnosis not present

## 2018-06-14 DIAGNOSIS — M6281 Muscle weakness (generalized): Secondary | ICD-10-CM | POA: Diagnosis not present

## 2018-06-14 DIAGNOSIS — R41841 Cognitive communication deficit: Secondary | ICD-10-CM | POA: Diagnosis not present

## 2018-06-14 DIAGNOSIS — Z9181 History of falling: Secondary | ICD-10-CM | POA: Diagnosis not present

## 2018-06-14 DIAGNOSIS — R2681 Unsteadiness on feet: Secondary | ICD-10-CM | POA: Diagnosis not present

## 2018-06-14 DIAGNOSIS — R2689 Other abnormalities of gait and mobility: Secondary | ICD-10-CM | POA: Diagnosis not present

## 2018-06-19 DIAGNOSIS — R41841 Cognitive communication deficit: Secondary | ICD-10-CM | POA: Diagnosis not present

## 2018-06-19 DIAGNOSIS — R2689 Other abnormalities of gait and mobility: Secondary | ICD-10-CM | POA: Diagnosis not present

## 2018-06-19 DIAGNOSIS — R2681 Unsteadiness on feet: Secondary | ICD-10-CM | POA: Diagnosis not present

## 2018-06-19 DIAGNOSIS — Z9181 History of falling: Secondary | ICD-10-CM | POA: Diagnosis not present

## 2018-06-19 DIAGNOSIS — M6281 Muscle weakness (generalized): Secondary | ICD-10-CM | POA: Diagnosis not present

## 2018-06-20 DIAGNOSIS — R2681 Unsteadiness on feet: Secondary | ICD-10-CM | POA: Diagnosis not present

## 2018-06-20 DIAGNOSIS — Z9181 History of falling: Secondary | ICD-10-CM | POA: Diagnosis not present

## 2018-06-20 DIAGNOSIS — R41841 Cognitive communication deficit: Secondary | ICD-10-CM | POA: Diagnosis not present

## 2018-06-20 DIAGNOSIS — M6281 Muscle weakness (generalized): Secondary | ICD-10-CM | POA: Diagnosis not present

## 2018-06-20 DIAGNOSIS — R2689 Other abnormalities of gait and mobility: Secondary | ICD-10-CM | POA: Diagnosis not present

## 2018-06-21 DIAGNOSIS — M6281 Muscle weakness (generalized): Secondary | ICD-10-CM | POA: Diagnosis not present

## 2018-06-21 DIAGNOSIS — Z9181 History of falling: Secondary | ICD-10-CM | POA: Diagnosis not present

## 2018-06-21 DIAGNOSIS — R2689 Other abnormalities of gait and mobility: Secondary | ICD-10-CM | POA: Diagnosis not present

## 2018-06-21 DIAGNOSIS — R2681 Unsteadiness on feet: Secondary | ICD-10-CM | POA: Diagnosis not present

## 2018-06-21 DIAGNOSIS — R41841 Cognitive communication deficit: Secondary | ICD-10-CM | POA: Diagnosis not present

## 2018-06-23 DIAGNOSIS — Z9181 History of falling: Secondary | ICD-10-CM | POA: Diagnosis not present

## 2018-06-23 DIAGNOSIS — R2681 Unsteadiness on feet: Secondary | ICD-10-CM | POA: Diagnosis not present

## 2018-06-23 DIAGNOSIS — M6281 Muscle weakness (generalized): Secondary | ICD-10-CM | POA: Diagnosis not present

## 2018-06-23 DIAGNOSIS — R2689 Other abnormalities of gait and mobility: Secondary | ICD-10-CM | POA: Diagnosis not present

## 2018-06-23 DIAGNOSIS — R41841 Cognitive communication deficit: Secondary | ICD-10-CM | POA: Diagnosis not present

## 2018-06-26 DIAGNOSIS — M6281 Muscle weakness (generalized): Secondary | ICD-10-CM | POA: Diagnosis not present

## 2018-06-26 DIAGNOSIS — Z9181 History of falling: Secondary | ICD-10-CM | POA: Diagnosis not present

## 2018-06-26 DIAGNOSIS — R2689 Other abnormalities of gait and mobility: Secondary | ICD-10-CM | POA: Diagnosis not present

## 2018-06-26 DIAGNOSIS — R41841 Cognitive communication deficit: Secondary | ICD-10-CM | POA: Diagnosis not present

## 2018-06-26 DIAGNOSIS — R2681 Unsteadiness on feet: Secondary | ICD-10-CM | POA: Diagnosis not present

## 2018-06-27 DIAGNOSIS — R2681 Unsteadiness on feet: Secondary | ICD-10-CM | POA: Diagnosis not present

## 2018-06-27 DIAGNOSIS — R41841 Cognitive communication deficit: Secondary | ICD-10-CM | POA: Diagnosis not present

## 2018-06-27 DIAGNOSIS — Z9181 History of falling: Secondary | ICD-10-CM | POA: Diagnosis not present

## 2018-06-27 DIAGNOSIS — R2689 Other abnormalities of gait and mobility: Secondary | ICD-10-CM | POA: Diagnosis not present

## 2018-06-27 DIAGNOSIS — M6281 Muscle weakness (generalized): Secondary | ICD-10-CM | POA: Diagnosis not present

## 2018-06-28 DIAGNOSIS — R2681 Unsteadiness on feet: Secondary | ICD-10-CM | POA: Diagnosis not present

## 2018-06-28 DIAGNOSIS — M6281 Muscle weakness (generalized): Secondary | ICD-10-CM | POA: Diagnosis not present

## 2018-06-28 DIAGNOSIS — R2689 Other abnormalities of gait and mobility: Secondary | ICD-10-CM | POA: Diagnosis not present

## 2018-06-28 DIAGNOSIS — Z9181 History of falling: Secondary | ICD-10-CM | POA: Diagnosis not present

## 2018-06-28 DIAGNOSIS — R41841 Cognitive communication deficit: Secondary | ICD-10-CM | POA: Diagnosis not present

## 2018-06-29 DIAGNOSIS — I6389 Other cerebral infarction: Secondary | ICD-10-CM | POA: Diagnosis not present

## 2018-06-29 DIAGNOSIS — N182 Chronic kidney disease, stage 2 (mild): Secondary | ICD-10-CM | POA: Diagnosis not present

## 2018-06-29 DIAGNOSIS — F329 Major depressive disorder, single episode, unspecified: Secondary | ICD-10-CM | POA: Diagnosis not present

## 2018-06-29 DIAGNOSIS — E538 Deficiency of other specified B group vitamins: Secondary | ICD-10-CM | POA: Diagnosis not present

## 2018-06-29 DIAGNOSIS — I129 Hypertensive chronic kidney disease with stage 1 through stage 4 chronic kidney disease, or unspecified chronic kidney disease: Secondary | ICD-10-CM | POA: Diagnosis not present

## 2018-06-29 DIAGNOSIS — R413 Other amnesia: Secondary | ICD-10-CM | POA: Diagnosis not present

## 2018-06-29 DIAGNOSIS — E039 Hypothyroidism, unspecified: Secondary | ICD-10-CM | POA: Diagnosis not present

## 2018-06-29 DIAGNOSIS — I10 Essential (primary) hypertension: Secondary | ICD-10-CM | POA: Diagnosis not present

## 2018-06-29 DIAGNOSIS — E871 Hypo-osmolality and hyponatremia: Secondary | ICD-10-CM | POA: Diagnosis not present

## 2018-06-30 DIAGNOSIS — I10 Essential (primary) hypertension: Secondary | ICD-10-CM | POA: Diagnosis not present

## 2018-07-03 DIAGNOSIS — R82998 Other abnormal findings in urine: Secondary | ICD-10-CM | POA: Diagnosis not present

## 2018-07-03 DIAGNOSIS — M6281 Muscle weakness (generalized): Secondary | ICD-10-CM | POA: Diagnosis not present

## 2018-07-03 DIAGNOSIS — R2689 Other abnormalities of gait and mobility: Secondary | ICD-10-CM | POA: Diagnosis not present

## 2018-07-03 DIAGNOSIS — R2681 Unsteadiness on feet: Secondary | ICD-10-CM | POA: Diagnosis not present

## 2018-07-03 DIAGNOSIS — R41841 Cognitive communication deficit: Secondary | ICD-10-CM | POA: Diagnosis not present

## 2018-07-03 DIAGNOSIS — Z9181 History of falling: Secondary | ICD-10-CM | POA: Diagnosis not present

## 2018-07-05 DIAGNOSIS — Z9181 History of falling: Secondary | ICD-10-CM | POA: Diagnosis not present

## 2018-07-05 DIAGNOSIS — M6281 Muscle weakness (generalized): Secondary | ICD-10-CM | POA: Diagnosis not present

## 2018-07-05 DIAGNOSIS — R2681 Unsteadiness on feet: Secondary | ICD-10-CM | POA: Diagnosis not present

## 2018-07-05 DIAGNOSIS — R41841 Cognitive communication deficit: Secondary | ICD-10-CM | POA: Diagnosis not present

## 2018-07-05 DIAGNOSIS — R2689 Other abnormalities of gait and mobility: Secondary | ICD-10-CM | POA: Diagnosis not present

## 2018-07-06 DIAGNOSIS — R41841 Cognitive communication deficit: Secondary | ICD-10-CM | POA: Diagnosis not present

## 2018-07-06 DIAGNOSIS — R2689 Other abnormalities of gait and mobility: Secondary | ICD-10-CM | POA: Diagnosis not present

## 2018-07-06 DIAGNOSIS — Z9181 History of falling: Secondary | ICD-10-CM | POA: Diagnosis not present

## 2018-07-06 DIAGNOSIS — M6281 Muscle weakness (generalized): Secondary | ICD-10-CM | POA: Diagnosis not present

## 2018-07-06 DIAGNOSIS — R2681 Unsteadiness on feet: Secondary | ICD-10-CM | POA: Diagnosis not present

## 2018-07-10 DIAGNOSIS — R2681 Unsteadiness on feet: Secondary | ICD-10-CM | POA: Diagnosis not present

## 2018-07-10 DIAGNOSIS — M6281 Muscle weakness (generalized): Secondary | ICD-10-CM | POA: Diagnosis not present

## 2018-07-10 DIAGNOSIS — R41841 Cognitive communication deficit: Secondary | ICD-10-CM | POA: Diagnosis not present

## 2018-07-10 DIAGNOSIS — R2689 Other abnormalities of gait and mobility: Secondary | ICD-10-CM | POA: Diagnosis not present

## 2018-07-10 DIAGNOSIS — Z9181 History of falling: Secondary | ICD-10-CM | POA: Diagnosis not present

## 2018-07-12 DIAGNOSIS — Z90710 Acquired absence of both cervix and uterus: Secondary | ICD-10-CM | POA: Diagnosis not present

## 2018-07-12 DIAGNOSIS — M6281 Muscle weakness (generalized): Secondary | ICD-10-CM | POA: Diagnosis not present

## 2018-07-12 DIAGNOSIS — R2681 Unsteadiness on feet: Secondary | ICD-10-CM | POA: Diagnosis not present

## 2018-07-12 DIAGNOSIS — R41841 Cognitive communication deficit: Secondary | ICD-10-CM | POA: Diagnosis not present

## 2018-07-12 DIAGNOSIS — R2689 Other abnormalities of gait and mobility: Secondary | ICD-10-CM | POA: Diagnosis not present

## 2018-07-17 DIAGNOSIS — M6281 Muscle weakness (generalized): Secondary | ICD-10-CM | POA: Diagnosis not present

## 2018-07-17 DIAGNOSIS — R41841 Cognitive communication deficit: Secondary | ICD-10-CM | POA: Diagnosis not present

## 2018-07-17 DIAGNOSIS — Z90710 Acquired absence of both cervix and uterus: Secondary | ICD-10-CM | POA: Diagnosis not present

## 2018-07-17 DIAGNOSIS — R2689 Other abnormalities of gait and mobility: Secondary | ICD-10-CM | POA: Diagnosis not present

## 2018-07-17 DIAGNOSIS — R2681 Unsteadiness on feet: Secondary | ICD-10-CM | POA: Diagnosis not present

## 2018-07-19 DIAGNOSIS — R2689 Other abnormalities of gait and mobility: Secondary | ICD-10-CM | POA: Diagnosis not present

## 2018-07-19 DIAGNOSIS — R2681 Unsteadiness on feet: Secondary | ICD-10-CM | POA: Diagnosis not present

## 2018-07-19 DIAGNOSIS — Z90710 Acquired absence of both cervix and uterus: Secondary | ICD-10-CM | POA: Diagnosis not present

## 2018-07-19 DIAGNOSIS — M6281 Muscle weakness (generalized): Secondary | ICD-10-CM | POA: Diagnosis not present

## 2018-07-19 DIAGNOSIS — R41841 Cognitive communication deficit: Secondary | ICD-10-CM | POA: Diagnosis not present

## 2018-07-20 DIAGNOSIS — M6281 Muscle weakness (generalized): Secondary | ICD-10-CM | POA: Diagnosis not present

## 2018-07-20 DIAGNOSIS — R2681 Unsteadiness on feet: Secondary | ICD-10-CM | POA: Diagnosis not present

## 2018-07-20 DIAGNOSIS — Z90710 Acquired absence of both cervix and uterus: Secondary | ICD-10-CM | POA: Diagnosis not present

## 2018-07-20 DIAGNOSIS — R41841 Cognitive communication deficit: Secondary | ICD-10-CM | POA: Diagnosis not present

## 2018-07-20 DIAGNOSIS — R2689 Other abnormalities of gait and mobility: Secondary | ICD-10-CM | POA: Diagnosis not present

## 2018-07-24 DIAGNOSIS — R2689 Other abnormalities of gait and mobility: Secondary | ICD-10-CM | POA: Diagnosis not present

## 2018-07-24 DIAGNOSIS — R2681 Unsteadiness on feet: Secondary | ICD-10-CM | POA: Diagnosis not present

## 2018-07-24 DIAGNOSIS — Z90710 Acquired absence of both cervix and uterus: Secondary | ICD-10-CM | POA: Diagnosis not present

## 2018-07-24 DIAGNOSIS — R41841 Cognitive communication deficit: Secondary | ICD-10-CM | POA: Diagnosis not present

## 2018-07-24 DIAGNOSIS — M6281 Muscle weakness (generalized): Secondary | ICD-10-CM | POA: Diagnosis not present

## 2018-07-26 DIAGNOSIS — R41841 Cognitive communication deficit: Secondary | ICD-10-CM | POA: Diagnosis not present

## 2018-07-26 DIAGNOSIS — R2689 Other abnormalities of gait and mobility: Secondary | ICD-10-CM | POA: Diagnosis not present

## 2018-07-26 DIAGNOSIS — R2681 Unsteadiness on feet: Secondary | ICD-10-CM | POA: Diagnosis not present

## 2018-07-26 DIAGNOSIS — M6281 Muscle weakness (generalized): Secondary | ICD-10-CM | POA: Diagnosis not present

## 2018-07-26 DIAGNOSIS — Z90710 Acquired absence of both cervix and uterus: Secondary | ICD-10-CM | POA: Diagnosis not present

## 2018-07-31 DIAGNOSIS — Z90710 Acquired absence of both cervix and uterus: Secondary | ICD-10-CM | POA: Diagnosis not present

## 2018-07-31 DIAGNOSIS — R41841 Cognitive communication deficit: Secondary | ICD-10-CM | POA: Diagnosis not present

## 2018-07-31 DIAGNOSIS — M6281 Muscle weakness (generalized): Secondary | ICD-10-CM | POA: Diagnosis not present

## 2018-07-31 DIAGNOSIS — R2681 Unsteadiness on feet: Secondary | ICD-10-CM | POA: Diagnosis not present

## 2018-07-31 DIAGNOSIS — R2689 Other abnormalities of gait and mobility: Secondary | ICD-10-CM | POA: Diagnosis not present

## 2018-08-02 DIAGNOSIS — Z90710 Acquired absence of both cervix and uterus: Secondary | ICD-10-CM | POA: Diagnosis not present

## 2018-08-02 DIAGNOSIS — R41841 Cognitive communication deficit: Secondary | ICD-10-CM | POA: Diagnosis not present

## 2018-08-02 DIAGNOSIS — R2681 Unsteadiness on feet: Secondary | ICD-10-CM | POA: Diagnosis not present

## 2018-08-02 DIAGNOSIS — M6281 Muscle weakness (generalized): Secondary | ICD-10-CM | POA: Diagnosis not present

## 2018-08-02 DIAGNOSIS — R2689 Other abnormalities of gait and mobility: Secondary | ICD-10-CM | POA: Diagnosis not present

## 2018-08-07 DIAGNOSIS — Z90710 Acquired absence of both cervix and uterus: Secondary | ICD-10-CM | POA: Diagnosis not present

## 2018-08-07 DIAGNOSIS — R2681 Unsteadiness on feet: Secondary | ICD-10-CM | POA: Diagnosis not present

## 2018-08-07 DIAGNOSIS — R41841 Cognitive communication deficit: Secondary | ICD-10-CM | POA: Diagnosis not present

## 2018-08-07 DIAGNOSIS — M6281 Muscle weakness (generalized): Secondary | ICD-10-CM | POA: Diagnosis not present

## 2018-08-07 DIAGNOSIS — R2689 Other abnormalities of gait and mobility: Secondary | ICD-10-CM | POA: Diagnosis not present

## 2018-08-09 DIAGNOSIS — Z90710 Acquired absence of both cervix and uterus: Secondary | ICD-10-CM | POA: Diagnosis not present

## 2018-08-09 DIAGNOSIS — R2689 Other abnormalities of gait and mobility: Secondary | ICD-10-CM | POA: Diagnosis not present

## 2018-08-09 DIAGNOSIS — R2681 Unsteadiness on feet: Secondary | ICD-10-CM | POA: Diagnosis not present

## 2018-08-09 DIAGNOSIS — R41841 Cognitive communication deficit: Secondary | ICD-10-CM | POA: Diagnosis not present

## 2018-08-09 DIAGNOSIS — M6281 Muscle weakness (generalized): Secondary | ICD-10-CM | POA: Diagnosis not present

## 2018-08-11 DIAGNOSIS — R2681 Unsteadiness on feet: Secondary | ICD-10-CM | POA: Diagnosis not present

## 2018-08-11 DIAGNOSIS — R41841 Cognitive communication deficit: Secondary | ICD-10-CM | POA: Diagnosis not present

## 2018-08-11 DIAGNOSIS — Z90710 Acquired absence of both cervix and uterus: Secondary | ICD-10-CM | POA: Diagnosis not present

## 2018-08-11 DIAGNOSIS — R2689 Other abnormalities of gait and mobility: Secondary | ICD-10-CM | POA: Diagnosis not present

## 2018-08-11 DIAGNOSIS — M6281 Muscle weakness (generalized): Secondary | ICD-10-CM | POA: Diagnosis not present

## 2018-08-14 DIAGNOSIS — R41841 Cognitive communication deficit: Secondary | ICD-10-CM | POA: Diagnosis not present

## 2018-08-14 DIAGNOSIS — E039 Hypothyroidism, unspecified: Secondary | ICD-10-CM | POA: Diagnosis not present

## 2018-08-14 DIAGNOSIS — Z90710 Acquired absence of both cervix and uterus: Secondary | ICD-10-CM | POA: Diagnosis not present

## 2018-08-14 DIAGNOSIS — E785 Hyperlipidemia, unspecified: Secondary | ICD-10-CM | POA: Diagnosis not present

## 2018-08-14 DIAGNOSIS — R2681 Unsteadiness on feet: Secondary | ICD-10-CM | POA: Diagnosis not present

## 2018-08-16 DIAGNOSIS — R2681 Unsteadiness on feet: Secondary | ICD-10-CM | POA: Diagnosis not present

## 2018-08-16 DIAGNOSIS — R41841 Cognitive communication deficit: Secondary | ICD-10-CM | POA: Diagnosis not present

## 2018-08-16 DIAGNOSIS — Z90710 Acquired absence of both cervix and uterus: Secondary | ICD-10-CM | POA: Diagnosis not present

## 2018-08-16 DIAGNOSIS — E039 Hypothyroidism, unspecified: Secondary | ICD-10-CM | POA: Diagnosis not present

## 2018-08-16 DIAGNOSIS — E785 Hyperlipidemia, unspecified: Secondary | ICD-10-CM | POA: Diagnosis not present

## 2018-08-18 DIAGNOSIS — F329 Major depressive disorder, single episode, unspecified: Secondary | ICD-10-CM | POA: Diagnosis not present

## 2018-08-18 DIAGNOSIS — D519 Vitamin B12 deficiency anemia, unspecified: Secondary | ICD-10-CM | POA: Diagnosis not present

## 2018-08-18 DIAGNOSIS — R7301 Impaired fasting glucose: Secondary | ICD-10-CM | POA: Diagnosis not present

## 2018-08-18 DIAGNOSIS — E871 Hypo-osmolality and hyponatremia: Secondary | ICD-10-CM | POA: Diagnosis not present

## 2018-08-18 DIAGNOSIS — I10 Essential (primary) hypertension: Secondary | ICD-10-CM | POA: Diagnosis not present

## 2018-08-18 DIAGNOSIS — R413 Other amnesia: Secondary | ICD-10-CM | POA: Diagnosis not present

## 2018-08-18 DIAGNOSIS — I6389 Other cerebral infarction: Secondary | ICD-10-CM | POA: Diagnosis not present

## 2018-08-18 DIAGNOSIS — L120 Bullous pemphigoid: Secondary | ICD-10-CM | POA: Diagnosis not present

## 2018-08-18 DIAGNOSIS — E039 Hypothyroidism, unspecified: Secondary | ICD-10-CM | POA: Diagnosis not present

## 2018-08-21 DIAGNOSIS — Z90710 Acquired absence of both cervix and uterus: Secondary | ICD-10-CM | POA: Diagnosis not present

## 2018-08-21 DIAGNOSIS — R2681 Unsteadiness on feet: Secondary | ICD-10-CM | POA: Diagnosis not present

## 2018-08-21 DIAGNOSIS — E785 Hyperlipidemia, unspecified: Secondary | ICD-10-CM | POA: Diagnosis not present

## 2018-08-21 DIAGNOSIS — E039 Hypothyroidism, unspecified: Secondary | ICD-10-CM | POA: Diagnosis not present

## 2018-08-21 DIAGNOSIS — R41841 Cognitive communication deficit: Secondary | ICD-10-CM | POA: Diagnosis not present

## 2018-08-22 DIAGNOSIS — H401112 Primary open-angle glaucoma, right eye, moderate stage: Secondary | ICD-10-CM | POA: Diagnosis not present

## 2018-08-22 DIAGNOSIS — H353112 Nonexudative age-related macular degeneration, right eye, intermediate dry stage: Secondary | ICD-10-CM | POA: Diagnosis not present

## 2018-08-22 DIAGNOSIS — H52201 Unspecified astigmatism, right eye: Secondary | ICD-10-CM | POA: Diagnosis not present

## 2018-08-22 DIAGNOSIS — H2512 Age-related nuclear cataract, left eye: Secondary | ICD-10-CM | POA: Diagnosis not present

## 2018-08-23 DIAGNOSIS — Z90710 Acquired absence of both cervix and uterus: Secondary | ICD-10-CM | POA: Diagnosis not present

## 2018-08-23 DIAGNOSIS — E785 Hyperlipidemia, unspecified: Secondary | ICD-10-CM | POA: Diagnosis not present

## 2018-08-23 DIAGNOSIS — E039 Hypothyroidism, unspecified: Secondary | ICD-10-CM | POA: Diagnosis not present

## 2018-08-23 DIAGNOSIS — R2681 Unsteadiness on feet: Secondary | ICD-10-CM | POA: Diagnosis not present

## 2018-08-23 DIAGNOSIS — R41841 Cognitive communication deficit: Secondary | ICD-10-CM | POA: Diagnosis not present

## 2018-08-25 DIAGNOSIS — E785 Hyperlipidemia, unspecified: Secondary | ICD-10-CM | POA: Diagnosis not present

## 2018-08-25 DIAGNOSIS — E039 Hypothyroidism, unspecified: Secondary | ICD-10-CM | POA: Diagnosis not present

## 2018-08-25 DIAGNOSIS — Z90710 Acquired absence of both cervix and uterus: Secondary | ICD-10-CM | POA: Diagnosis not present

## 2018-08-25 DIAGNOSIS — R2681 Unsteadiness on feet: Secondary | ICD-10-CM | POA: Diagnosis not present

## 2018-08-25 DIAGNOSIS — R41841 Cognitive communication deficit: Secondary | ICD-10-CM | POA: Diagnosis not present

## 2018-08-28 DIAGNOSIS — E785 Hyperlipidemia, unspecified: Secondary | ICD-10-CM | POA: Diagnosis not present

## 2018-08-28 DIAGNOSIS — R2681 Unsteadiness on feet: Secondary | ICD-10-CM | POA: Diagnosis not present

## 2018-08-28 DIAGNOSIS — E039 Hypothyroidism, unspecified: Secondary | ICD-10-CM | POA: Diagnosis not present

## 2018-08-28 DIAGNOSIS — Z90710 Acquired absence of both cervix and uterus: Secondary | ICD-10-CM | POA: Diagnosis not present

## 2018-08-28 DIAGNOSIS — R41841 Cognitive communication deficit: Secondary | ICD-10-CM | POA: Diagnosis not present

## 2018-08-30 DIAGNOSIS — E039 Hypothyroidism, unspecified: Secondary | ICD-10-CM | POA: Diagnosis not present

## 2018-08-30 DIAGNOSIS — Z90710 Acquired absence of both cervix and uterus: Secondary | ICD-10-CM | POA: Diagnosis not present

## 2018-08-30 DIAGNOSIS — R41841 Cognitive communication deficit: Secondary | ICD-10-CM | POA: Diagnosis not present

## 2018-08-30 DIAGNOSIS — E785 Hyperlipidemia, unspecified: Secondary | ICD-10-CM | POA: Diagnosis not present

## 2018-08-30 DIAGNOSIS — R2681 Unsteadiness on feet: Secondary | ICD-10-CM | POA: Diagnosis not present

## 2018-09-01 DIAGNOSIS — Z90710 Acquired absence of both cervix and uterus: Secondary | ICD-10-CM | POA: Diagnosis not present

## 2018-09-01 DIAGNOSIS — E785 Hyperlipidemia, unspecified: Secondary | ICD-10-CM | POA: Diagnosis not present

## 2018-09-01 DIAGNOSIS — R2681 Unsteadiness on feet: Secondary | ICD-10-CM | POA: Diagnosis not present

## 2018-09-01 DIAGNOSIS — R41841 Cognitive communication deficit: Secondary | ICD-10-CM | POA: Diagnosis not present

## 2018-09-01 DIAGNOSIS — E039 Hypothyroidism, unspecified: Secondary | ICD-10-CM | POA: Diagnosis not present

## 2018-09-04 DIAGNOSIS — R41841 Cognitive communication deficit: Secondary | ICD-10-CM | POA: Diagnosis not present

## 2018-09-04 DIAGNOSIS — Z90710 Acquired absence of both cervix and uterus: Secondary | ICD-10-CM | POA: Diagnosis not present

## 2018-09-04 DIAGNOSIS — R2681 Unsteadiness on feet: Secondary | ICD-10-CM | POA: Diagnosis not present

## 2018-09-04 DIAGNOSIS — E039 Hypothyroidism, unspecified: Secondary | ICD-10-CM | POA: Diagnosis not present

## 2018-09-04 DIAGNOSIS — E785 Hyperlipidemia, unspecified: Secondary | ICD-10-CM | POA: Diagnosis not present

## 2018-09-05 DIAGNOSIS — J301 Allergic rhinitis due to pollen: Secondary | ICD-10-CM | POA: Diagnosis not present

## 2018-09-08 DIAGNOSIS — Z90710 Acquired absence of both cervix and uterus: Secondary | ICD-10-CM | POA: Diagnosis not present

## 2018-09-08 DIAGNOSIS — R41841 Cognitive communication deficit: Secondary | ICD-10-CM | POA: Diagnosis not present

## 2018-09-08 DIAGNOSIS — E785 Hyperlipidemia, unspecified: Secondary | ICD-10-CM | POA: Diagnosis not present

## 2018-09-08 DIAGNOSIS — E039 Hypothyroidism, unspecified: Secondary | ICD-10-CM | POA: Diagnosis not present

## 2018-09-08 DIAGNOSIS — R2681 Unsteadiness on feet: Secondary | ICD-10-CM | POA: Diagnosis not present

## 2018-09-11 DIAGNOSIS — R41841 Cognitive communication deficit: Secondary | ICD-10-CM | POA: Diagnosis not present

## 2018-09-11 DIAGNOSIS — E785 Hyperlipidemia, unspecified: Secondary | ICD-10-CM | POA: Diagnosis not present

## 2018-09-11 DIAGNOSIS — Z90710 Acquired absence of both cervix and uterus: Secondary | ICD-10-CM | POA: Diagnosis not present

## 2018-09-11 DIAGNOSIS — E039 Hypothyroidism, unspecified: Secondary | ICD-10-CM | POA: Diagnosis not present

## 2018-09-11 DIAGNOSIS — R2681 Unsteadiness on feet: Secondary | ICD-10-CM | POA: Diagnosis not present

## 2018-09-13 DIAGNOSIS — E039 Hypothyroidism, unspecified: Secondary | ICD-10-CM | POA: Diagnosis not present

## 2018-09-13 DIAGNOSIS — E785 Hyperlipidemia, unspecified: Secondary | ICD-10-CM | POA: Diagnosis not present

## 2018-09-13 DIAGNOSIS — R41841 Cognitive communication deficit: Secondary | ICD-10-CM | POA: Diagnosis not present

## 2018-09-13 DIAGNOSIS — Z90710 Acquired absence of both cervix and uterus: Secondary | ICD-10-CM | POA: Diagnosis not present

## 2018-09-15 DIAGNOSIS — E039 Hypothyroidism, unspecified: Secondary | ICD-10-CM | POA: Diagnosis not present

## 2018-09-15 DIAGNOSIS — E785 Hyperlipidemia, unspecified: Secondary | ICD-10-CM | POA: Diagnosis not present

## 2018-09-15 DIAGNOSIS — Z90710 Acquired absence of both cervix and uterus: Secondary | ICD-10-CM | POA: Diagnosis not present

## 2018-09-15 DIAGNOSIS — R41841 Cognitive communication deficit: Secondary | ICD-10-CM | POA: Diagnosis not present

## 2018-09-20 DIAGNOSIS — E785 Hyperlipidemia, unspecified: Secondary | ICD-10-CM | POA: Diagnosis not present

## 2018-09-20 DIAGNOSIS — R41841 Cognitive communication deficit: Secondary | ICD-10-CM | POA: Diagnosis not present

## 2018-09-20 DIAGNOSIS — Z90710 Acquired absence of both cervix and uterus: Secondary | ICD-10-CM | POA: Diagnosis not present

## 2018-09-20 DIAGNOSIS — E039 Hypothyroidism, unspecified: Secondary | ICD-10-CM | POA: Diagnosis not present

## 2018-09-22 DIAGNOSIS — J301 Allergic rhinitis due to pollen: Secondary | ICD-10-CM | POA: Diagnosis not present

## 2018-09-22 DIAGNOSIS — J3089 Other allergic rhinitis: Secondary | ICD-10-CM | POA: Diagnosis not present

## 2018-09-22 DIAGNOSIS — E785 Hyperlipidemia, unspecified: Secondary | ICD-10-CM | POA: Diagnosis not present

## 2018-09-22 DIAGNOSIS — E039 Hypothyroidism, unspecified: Secondary | ICD-10-CM | POA: Diagnosis not present

## 2018-09-22 DIAGNOSIS — R41841 Cognitive communication deficit: Secondary | ICD-10-CM | POA: Diagnosis not present

## 2018-09-22 DIAGNOSIS — Z90710 Acquired absence of both cervix and uterus: Secondary | ICD-10-CM | POA: Diagnosis not present

## 2018-09-25 DIAGNOSIS — R41841 Cognitive communication deficit: Secondary | ICD-10-CM | POA: Diagnosis not present

## 2018-09-25 DIAGNOSIS — E785 Hyperlipidemia, unspecified: Secondary | ICD-10-CM | POA: Diagnosis not present

## 2018-09-25 DIAGNOSIS — E039 Hypothyroidism, unspecified: Secondary | ICD-10-CM | POA: Diagnosis not present

## 2018-09-25 DIAGNOSIS — Z90710 Acquired absence of both cervix and uterus: Secondary | ICD-10-CM | POA: Diagnosis not present

## 2018-09-29 DIAGNOSIS — E785 Hyperlipidemia, unspecified: Secondary | ICD-10-CM | POA: Diagnosis not present

## 2018-09-29 DIAGNOSIS — E039 Hypothyroidism, unspecified: Secondary | ICD-10-CM | POA: Diagnosis not present

## 2018-09-29 DIAGNOSIS — Z90710 Acquired absence of both cervix and uterus: Secondary | ICD-10-CM | POA: Diagnosis not present

## 2018-09-29 DIAGNOSIS — R41841 Cognitive communication deficit: Secondary | ICD-10-CM | POA: Diagnosis not present

## 2018-10-09 DIAGNOSIS — E039 Hypothyroidism, unspecified: Secondary | ICD-10-CM | POA: Diagnosis not present

## 2018-10-09 DIAGNOSIS — Z90710 Acquired absence of both cervix and uterus: Secondary | ICD-10-CM | POA: Diagnosis not present

## 2018-10-09 DIAGNOSIS — E785 Hyperlipidemia, unspecified: Secondary | ICD-10-CM | POA: Diagnosis not present

## 2018-10-09 DIAGNOSIS — R41841 Cognitive communication deficit: Secondary | ICD-10-CM | POA: Diagnosis not present

## 2018-10-11 DIAGNOSIS — R41841 Cognitive communication deficit: Secondary | ICD-10-CM | POA: Diagnosis not present

## 2018-10-11 DIAGNOSIS — E785 Hyperlipidemia, unspecified: Secondary | ICD-10-CM | POA: Diagnosis not present

## 2018-10-11 DIAGNOSIS — Z90710 Acquired absence of both cervix and uterus: Secondary | ICD-10-CM | POA: Diagnosis not present

## 2018-10-11 DIAGNOSIS — E039 Hypothyroidism, unspecified: Secondary | ICD-10-CM | POA: Diagnosis not present

## 2018-10-13 DIAGNOSIS — R41841 Cognitive communication deficit: Secondary | ICD-10-CM | POA: Diagnosis not present

## 2018-10-13 DIAGNOSIS — E039 Hypothyroidism, unspecified: Secondary | ICD-10-CM | POA: Diagnosis not present

## 2018-10-13 DIAGNOSIS — E785 Hyperlipidemia, unspecified: Secondary | ICD-10-CM | POA: Diagnosis not present

## 2018-10-13 DIAGNOSIS — Z90710 Acquired absence of both cervix and uterus: Secondary | ICD-10-CM | POA: Diagnosis not present

## 2018-10-16 DIAGNOSIS — E039 Hypothyroidism, unspecified: Secondary | ICD-10-CM | POA: Diagnosis not present

## 2018-10-16 DIAGNOSIS — Z90710 Acquired absence of both cervix and uterus: Secondary | ICD-10-CM | POA: Diagnosis not present

## 2018-10-16 DIAGNOSIS — E785 Hyperlipidemia, unspecified: Secondary | ICD-10-CM | POA: Diagnosis not present

## 2018-10-16 DIAGNOSIS — R41841 Cognitive communication deficit: Secondary | ICD-10-CM | POA: Diagnosis not present

## 2018-11-23 DIAGNOSIS — E039 Hypothyroidism, unspecified: Secondary | ICD-10-CM | POA: Diagnosis not present

## 2018-11-23 DIAGNOSIS — R2681 Unsteadiness on feet: Secondary | ICD-10-CM | POA: Diagnosis not present

## 2018-11-23 DIAGNOSIS — R2689 Other abnormalities of gait and mobility: Secondary | ICD-10-CM | POA: Diagnosis not present

## 2018-11-23 DIAGNOSIS — Z90710 Acquired absence of both cervix and uterus: Secondary | ICD-10-CM | POA: Diagnosis not present

## 2018-11-23 DIAGNOSIS — Z9181 History of falling: Secondary | ICD-10-CM | POA: Diagnosis not present

## 2018-11-28 DIAGNOSIS — R2689 Other abnormalities of gait and mobility: Secondary | ICD-10-CM | POA: Diagnosis not present

## 2018-11-28 DIAGNOSIS — E039 Hypothyroidism, unspecified: Secondary | ICD-10-CM | POA: Diagnosis not present

## 2018-11-28 DIAGNOSIS — Z9181 History of falling: Secondary | ICD-10-CM | POA: Diagnosis not present

## 2018-11-28 DIAGNOSIS — Z90710 Acquired absence of both cervix and uterus: Secondary | ICD-10-CM | POA: Diagnosis not present

## 2018-11-28 DIAGNOSIS — R2681 Unsteadiness on feet: Secondary | ICD-10-CM | POA: Diagnosis not present

## 2018-11-30 DIAGNOSIS — R2689 Other abnormalities of gait and mobility: Secondary | ICD-10-CM | POA: Diagnosis not present

## 2018-11-30 DIAGNOSIS — R2681 Unsteadiness on feet: Secondary | ICD-10-CM | POA: Diagnosis not present

## 2018-11-30 DIAGNOSIS — Z9181 History of falling: Secondary | ICD-10-CM | POA: Diagnosis not present

## 2018-11-30 DIAGNOSIS — E039 Hypothyroidism, unspecified: Secondary | ICD-10-CM | POA: Diagnosis not present

## 2018-11-30 DIAGNOSIS — Z90710 Acquired absence of both cervix and uterus: Secondary | ICD-10-CM | POA: Diagnosis not present

## 2018-12-05 DIAGNOSIS — R2689 Other abnormalities of gait and mobility: Secondary | ICD-10-CM | POA: Diagnosis not present

## 2018-12-05 DIAGNOSIS — Z9181 History of falling: Secondary | ICD-10-CM | POA: Diagnosis not present

## 2018-12-05 DIAGNOSIS — E039 Hypothyroidism, unspecified: Secondary | ICD-10-CM | POA: Diagnosis not present

## 2018-12-05 DIAGNOSIS — R2681 Unsteadiness on feet: Secondary | ICD-10-CM | POA: Diagnosis not present

## 2018-12-05 DIAGNOSIS — Z90710 Acquired absence of both cervix and uterus: Secondary | ICD-10-CM | POA: Diagnosis not present

## 2018-12-08 DIAGNOSIS — R2681 Unsteadiness on feet: Secondary | ICD-10-CM | POA: Diagnosis not present

## 2018-12-08 DIAGNOSIS — R2689 Other abnormalities of gait and mobility: Secondary | ICD-10-CM | POA: Diagnosis not present

## 2018-12-08 DIAGNOSIS — Z9181 History of falling: Secondary | ICD-10-CM | POA: Diagnosis not present

## 2018-12-08 DIAGNOSIS — Z90710 Acquired absence of both cervix and uterus: Secondary | ICD-10-CM | POA: Diagnosis not present

## 2018-12-08 DIAGNOSIS — E039 Hypothyroidism, unspecified: Secondary | ICD-10-CM | POA: Diagnosis not present

## 2018-12-11 DIAGNOSIS — I6389 Other cerebral infarction: Secondary | ICD-10-CM | POA: Diagnosis not present

## 2018-12-11 DIAGNOSIS — F329 Major depressive disorder, single episode, unspecified: Secondary | ICD-10-CM | POA: Diagnosis not present

## 2018-12-11 DIAGNOSIS — N182 Chronic kidney disease, stage 2 (mild): Secondary | ICD-10-CM | POA: Diagnosis not present

## 2018-12-11 DIAGNOSIS — I10 Essential (primary) hypertension: Secondary | ICD-10-CM | POA: Diagnosis not present

## 2018-12-11 DIAGNOSIS — R413 Other amnesia: Secondary | ICD-10-CM | POA: Diagnosis not present

## 2018-12-11 DIAGNOSIS — I129 Hypertensive chronic kidney disease with stage 1 through stage 4 chronic kidney disease, or unspecified chronic kidney disease: Secondary | ICD-10-CM | POA: Diagnosis not present

## 2018-12-11 DIAGNOSIS — E038 Other specified hypothyroidism: Secondary | ICD-10-CM | POA: Diagnosis not present

## 2018-12-11 DIAGNOSIS — R7301 Impaired fasting glucose: Secondary | ICD-10-CM | POA: Diagnosis not present

## 2018-12-11 DIAGNOSIS — L120 Bullous pemphigoid: Secondary | ICD-10-CM | POA: Diagnosis not present

## 2018-12-11 DIAGNOSIS — G252 Other specified forms of tremor: Secondary | ICD-10-CM | POA: Diagnosis not present

## 2018-12-11 DIAGNOSIS — E871 Hypo-osmolality and hyponatremia: Secondary | ICD-10-CM | POA: Diagnosis not present

## 2018-12-12 DIAGNOSIS — Z90710 Acquired absence of both cervix and uterus: Secondary | ICD-10-CM | POA: Diagnosis not present

## 2018-12-12 DIAGNOSIS — E039 Hypothyroidism, unspecified: Secondary | ICD-10-CM | POA: Diagnosis not present

## 2018-12-12 DIAGNOSIS — R2689 Other abnormalities of gait and mobility: Secondary | ICD-10-CM | POA: Diagnosis not present

## 2018-12-12 DIAGNOSIS — R2681 Unsteadiness on feet: Secondary | ICD-10-CM | POA: Diagnosis not present

## 2018-12-12 DIAGNOSIS — Z9181 History of falling: Secondary | ICD-10-CM | POA: Diagnosis not present

## 2018-12-14 DIAGNOSIS — Z90710 Acquired absence of both cervix and uterus: Secondary | ICD-10-CM | POA: Diagnosis not present

## 2018-12-14 DIAGNOSIS — R2681 Unsteadiness on feet: Secondary | ICD-10-CM | POA: Diagnosis not present

## 2018-12-14 DIAGNOSIS — E039 Hypothyroidism, unspecified: Secondary | ICD-10-CM | POA: Diagnosis not present

## 2018-12-14 DIAGNOSIS — Z9181 History of falling: Secondary | ICD-10-CM | POA: Diagnosis not present

## 2018-12-14 DIAGNOSIS — R2689 Other abnormalities of gait and mobility: Secondary | ICD-10-CM | POA: Diagnosis not present

## 2018-12-19 DIAGNOSIS — R2689 Other abnormalities of gait and mobility: Secondary | ICD-10-CM | POA: Diagnosis not present

## 2018-12-19 DIAGNOSIS — Z90710 Acquired absence of both cervix and uterus: Secondary | ICD-10-CM | POA: Diagnosis not present

## 2018-12-19 DIAGNOSIS — R2681 Unsteadiness on feet: Secondary | ICD-10-CM | POA: Diagnosis not present

## 2018-12-19 DIAGNOSIS — Z9181 History of falling: Secondary | ICD-10-CM | POA: Diagnosis not present

## 2018-12-19 DIAGNOSIS — E039 Hypothyroidism, unspecified: Secondary | ICD-10-CM | POA: Diagnosis not present

## 2018-12-21 DIAGNOSIS — R2689 Other abnormalities of gait and mobility: Secondary | ICD-10-CM | POA: Diagnosis not present

## 2018-12-21 DIAGNOSIS — E039 Hypothyroidism, unspecified: Secondary | ICD-10-CM | POA: Diagnosis not present

## 2018-12-21 DIAGNOSIS — Z9181 History of falling: Secondary | ICD-10-CM | POA: Diagnosis not present

## 2018-12-21 DIAGNOSIS — R2681 Unsteadiness on feet: Secondary | ICD-10-CM | POA: Diagnosis not present

## 2018-12-21 DIAGNOSIS — Z90710 Acquired absence of both cervix and uterus: Secondary | ICD-10-CM | POA: Diagnosis not present

## 2018-12-26 DIAGNOSIS — E039 Hypothyroidism, unspecified: Secondary | ICD-10-CM | POA: Diagnosis not present

## 2018-12-26 DIAGNOSIS — R2689 Other abnormalities of gait and mobility: Secondary | ICD-10-CM | POA: Diagnosis not present

## 2018-12-26 DIAGNOSIS — R2681 Unsteadiness on feet: Secondary | ICD-10-CM | POA: Diagnosis not present

## 2018-12-26 DIAGNOSIS — Z9181 History of falling: Secondary | ICD-10-CM | POA: Diagnosis not present

## 2018-12-26 DIAGNOSIS — Z90710 Acquired absence of both cervix and uterus: Secondary | ICD-10-CM | POA: Diagnosis not present

## 2018-12-27 DIAGNOSIS — R2681 Unsteadiness on feet: Secondary | ICD-10-CM | POA: Diagnosis not present

## 2018-12-27 DIAGNOSIS — Z9181 History of falling: Secondary | ICD-10-CM | POA: Diagnosis not present

## 2018-12-27 DIAGNOSIS — E039 Hypothyroidism, unspecified: Secondary | ICD-10-CM | POA: Diagnosis not present

## 2018-12-27 DIAGNOSIS — Z90710 Acquired absence of both cervix and uterus: Secondary | ICD-10-CM | POA: Diagnosis not present

## 2018-12-27 DIAGNOSIS — R2689 Other abnormalities of gait and mobility: Secondary | ICD-10-CM | POA: Diagnosis not present

## 2019-01-15 DIAGNOSIS — Z23 Encounter for immunization: Secondary | ICD-10-CM | POA: Diagnosis not present

## 2019-01-30 DIAGNOSIS — J301 Allergic rhinitis due to pollen: Secondary | ICD-10-CM | POA: Diagnosis not present

## 2019-02-12 DIAGNOSIS — Z23 Encounter for immunization: Secondary | ICD-10-CM | POA: Diagnosis not present

## 2019-03-16 DIAGNOSIS — E7849 Other hyperlipidemia: Secondary | ICD-10-CM | POA: Diagnosis not present

## 2019-03-16 DIAGNOSIS — E538 Deficiency of other specified B group vitamins: Secondary | ICD-10-CM | POA: Diagnosis not present

## 2019-03-16 DIAGNOSIS — M859 Disorder of bone density and structure, unspecified: Secondary | ICD-10-CM | POA: Diagnosis not present

## 2019-03-16 DIAGNOSIS — E038 Other specified hypothyroidism: Secondary | ICD-10-CM | POA: Diagnosis not present

## 2019-03-16 DIAGNOSIS — R7301 Impaired fasting glucose: Secondary | ICD-10-CM | POA: Diagnosis not present

## 2019-03-23 DIAGNOSIS — Z8673 Personal history of transient ischemic attack (TIA), and cerebral infarction without residual deficits: Secondary | ICD-10-CM | POA: Diagnosis not present

## 2019-03-23 DIAGNOSIS — W19XXXA Unspecified fall, initial encounter: Secondary | ICD-10-CM | POA: Diagnosis not present

## 2019-03-23 DIAGNOSIS — I129 Hypertensive chronic kidney disease with stage 1 through stage 4 chronic kidney disease, or unspecified chronic kidney disease: Secondary | ICD-10-CM | POA: Diagnosis not present

## 2019-03-23 DIAGNOSIS — R82998 Other abnormal findings in urine: Secondary | ICD-10-CM | POA: Diagnosis not present

## 2019-03-23 DIAGNOSIS — Z0000 Encounter for general adult medical examination without abnormal findings: Secondary | ICD-10-CM | POA: Diagnosis not present

## 2019-03-23 DIAGNOSIS — J309 Allergic rhinitis, unspecified: Secondary | ICD-10-CM | POA: Diagnosis not present

## 2019-03-23 DIAGNOSIS — N182 Chronic kidney disease, stage 2 (mild): Secondary | ICD-10-CM | POA: Diagnosis not present

## 2019-03-23 DIAGNOSIS — H348192 Central retinal vein occlusion, unspecified eye, stable: Secondary | ICD-10-CM | POA: Diagnosis not present

## 2019-03-23 DIAGNOSIS — E871 Hypo-osmolality and hyponatremia: Secondary | ICD-10-CM | POA: Diagnosis not present

## 2019-03-23 DIAGNOSIS — R413 Other amnesia: Secondary | ICD-10-CM | POA: Diagnosis not present

## 2019-03-23 DIAGNOSIS — D696 Thrombocytopenia, unspecified: Secondary | ICD-10-CM | POA: Diagnosis not present

## 2019-03-23 DIAGNOSIS — Z0289 Encounter for other administrative examinations: Secondary | ICD-10-CM | POA: Diagnosis not present

## 2019-03-23 DIAGNOSIS — R4182 Altered mental status, unspecified: Secondary | ICD-10-CM | POA: Diagnosis not present

## 2019-03-23 DIAGNOSIS — Z1331 Encounter for screening for depression: Secondary | ICD-10-CM | POA: Diagnosis not present

## 2019-04-20 DIAGNOSIS — H2512 Age-related nuclear cataract, left eye: Secondary | ICD-10-CM | POA: Diagnosis not present

## 2019-04-20 DIAGNOSIS — H401112 Primary open-angle glaucoma, right eye, moderate stage: Secondary | ICD-10-CM | POA: Diagnosis not present

## 2019-04-20 DIAGNOSIS — H401124 Primary open-angle glaucoma, left eye, indeterminate stage: Secondary | ICD-10-CM | POA: Diagnosis not present

## 2019-04-29 ENCOUNTER — Encounter (HOSPITAL_COMMUNITY): Payer: Self-pay | Admitting: *Deleted

## 2019-04-29 ENCOUNTER — Other Ambulatory Visit: Payer: Self-pay

## 2019-04-29 ENCOUNTER — Emergency Department (HOSPITAL_COMMUNITY): Payer: Medicare Other

## 2019-04-29 ENCOUNTER — Emergency Department (HOSPITAL_COMMUNITY)
Admission: EM | Admit: 2019-04-29 | Discharge: 2019-04-29 | Disposition: A | Discharge status: Home / Self Care | Payer: Medicare Other | Attending: Emergency Medicine | Admitting: Emergency Medicine

## 2019-04-29 DIAGNOSIS — S0990XA Unspecified injury of head, initial encounter: Secondary | ICD-10-CM | POA: Diagnosis not present

## 2019-04-29 DIAGNOSIS — Y939 Activity, unspecified: Secondary | ICD-10-CM | POA: Insufficient documentation

## 2019-04-29 DIAGNOSIS — Y999 Unspecified external cause status: Secondary | ICD-10-CM | POA: Diagnosis not present

## 2019-04-29 DIAGNOSIS — S0181XA Laceration without foreign body of other part of head, initial encounter: Secondary | ICD-10-CM

## 2019-04-29 DIAGNOSIS — Z23 Encounter for immunization: Secondary | ICD-10-CM | POA: Insufficient documentation

## 2019-04-29 DIAGNOSIS — W1830XA Fall on same level, unspecified, initial encounter: Secondary | ICD-10-CM | POA: Diagnosis not present

## 2019-04-29 DIAGNOSIS — M2550 Pain in unspecified joint: Secondary | ICD-10-CM | POA: Diagnosis not present

## 2019-04-29 DIAGNOSIS — Z79899 Other long term (current) drug therapy: Secondary | ICD-10-CM | POA: Insufficient documentation

## 2019-04-29 DIAGNOSIS — I10 Essential (primary) hypertension: Secondary | ICD-10-CM | POA: Diagnosis not present

## 2019-04-29 DIAGNOSIS — W19XXXA Unspecified fall, initial encounter: Secondary | ICD-10-CM

## 2019-04-29 DIAGNOSIS — Y92129 Unspecified place in nursing home as the place of occurrence of the external cause: Secondary | ICD-10-CM | POA: Insufficient documentation

## 2019-04-29 DIAGNOSIS — R0902 Hypoxemia: Secondary | ICD-10-CM | POA: Diagnosis not present

## 2019-04-29 DIAGNOSIS — R58 Hemorrhage, not elsewhere classified: Secondary | ICD-10-CM | POA: Diagnosis not present

## 2019-04-29 DIAGNOSIS — Z7401 Bed confinement status: Secondary | ICD-10-CM | POA: Diagnosis not present

## 2019-04-29 HISTORY — DX: Essential (primary) hypertension: I10

## 2019-04-29 LAB — BASIC METABOLIC PANEL
Anion gap: 11 (ref 5–15)
BUN: 10 mg/dL (ref 8–23)
CO2: 25 mmol/L (ref 22–32)
Calcium: 8.9 mg/dL (ref 8.9–10.3)
Chloride: 104 mmol/L (ref 98–111)
Creatinine, Ser: 0.74 mg/dL (ref 0.44–1.00)
GFR calc Af Amer: >60 mL/min (ref >60)
GFR calc non Af Amer: >60 mL/min (ref >60)
Glucose, Bld: 94 mg/dL (ref 70–99)
Potassium: 3.4 mmol/L — ABNORMAL LOW (ref 3.5–5.1)
Sodium: 140 mmol/L (ref 135–145)

## 2019-04-29 LAB — CBC WITH DIFFERENTIAL/PLATELET
Abs Immature Granulocytes: 0.02 10*3/uL (ref 0.00–0.07)
Basophils Absolute: 0 10*3/uL (ref 0.0–0.1)
Basophils Relative: 1 %
Eosinophils Absolute: 0.2 10*3/uL (ref 0.0–0.5)
Eosinophils Relative: 3 %
HCT: 37.3 % (ref 36.0–46.0)
Hemoglobin: 12 g/dL (ref 12.0–15.0)
Immature Granulocytes: 0 %
Lymphocytes Relative: 24 %
Lymphs Abs: 1.5 10*3/uL (ref 0.7–4.0)
MCH: 30.5 pg (ref 26.0–34.0)
MCHC: 32.2 g/dL (ref 30.0–36.0)
MCV: 94.9 fL (ref 80.0–100.0)
Monocytes Absolute: 1 10*3/uL (ref 0.1–1.0)
Monocytes Relative: 16 %
Neutro Abs: 3.7 10*3/uL (ref 1.7–7.7)
Neutrophils Relative %: 56 %
Platelets: 134 10*3/uL — ABNORMAL LOW (ref 150–400)
RBC: 3.93 MIL/uL (ref 3.87–5.11)
RDW: 14.9 % (ref 11.5–15.5)
WBC: 6.5 10*3/uL (ref 4.0–10.5)
nRBC: 0 % (ref 0.0–0.2)

## 2019-04-29 MED ORDER — ACETAMINOPHEN 500 MG PO TABS
1000.0000 mg | ORAL_TABLET | Freq: Once | ORAL | Status: AC
  Administered 2019-04-29: 1000 mg via ORAL
  Filled 2019-04-29: qty 2

## 2019-04-29 MED ORDER — TETANUS-DIPHTH-ACELL PERTUSSIS 5-2.5-18.5 LF-MCG/0.5 IM SUSP
0.5000 mL | Freq: Once | INTRAMUSCULAR | Status: AC
  Administered 2019-04-29: 0.5 mL via INTRAMUSCULAR
  Filled 2019-04-29: qty 0.5

## 2019-04-29 MED ORDER — LIDOCAINE-EPINEPHRINE (PF) 2 %-1:200000 IJ SOLN
20.0000 mL | Freq: Once | INTRAMUSCULAR | Status: AC
  Administered 2019-04-29: 20 mL via INTRADERMAL
  Filled 2019-04-29: qty 20

## 2019-04-29 NOTE — ED Triage Notes (Signed)
The pt arrived by [facility] from friends [facility] [place]  She fell going across the room  She struck her head against a book case  Lac to the lt sdie of her forehead  No other injuries pupils equal and react  Less vision lt eye she has a cataract on the  Lt eye

## 2019-04-29 NOTE — ED Notes (Signed)
Patient verbalizes understanding of discharge instructions . Opportunity for questions and answers were provided . Armband removed by staff ,Pt discharged from ED. W/C  offered at D/C  and Declined W/C at D/C and was escorted to lobby by RN.  

## 2019-04-29 NOTE — ED Notes (Signed)
Alert and oriented.

## 2019-04-29 NOTE — ED Triage Notes (Signed)
The pt has completed both her vaccines in February  [facility]

## 2019-04-29 NOTE — ED Provider Notes (Signed)
LACERATION REPAIR Performed by: Dewaine Oats Authorized by: Dewaine Oats Consent: Verbal consent obtained. Risks and benefits: risks, benefits and alternatives were discussed Consent given by: patient Patient identity confirmed: provided demographic data Prepped and Draped in normal sterile fashion Wound explored  Laceration Location: right forehead  Laceration Length: 4 cm  No Foreign Bodies seen or palpated  Anesthesia: local infiltration  Local anesthetic: lidocaine 2% w/epinephrine  Anesthetic total: 2 ml  Irrigation method: syringe Amount of cleaning: standard  SQ closure: 6-0 vicryl Skin closure: 6-0 prolene  Number SQ: 2 Number of sutures: 10  Technique: simple interrupted (inverted on SQ)  Patient tolerance: Patient tolerated the procedure well with no immediate complications.    Charlann Lange, PA-C 04/29/19 0510    Fatima Blank, MD 04/29/19 [phone]

## 2019-04-29 NOTE — ED Provider Notes (Signed)
[facility] EMERGENCY DEPARTMENT Provider Note  CSN: [id number] Arrival date & time: 04/29/19 0205  Chief Complaint(s) Fall  HPI Deborah Tucker is a 86 y.o. female who presents to the emergency department as a level 2 trauma after ground-level fall.  Patient reports tripping and falling while at the assisted living facility.  States she hit her head on the way down.  Denies any loss of consciousness.  States that she is on Plavix.  No other anticoagulation.  Patient reports that the fall occurred around 11 PM.  She was not able to get up but made it to the pull cord and was able to call for help.  She noted bleeding which is now controlled.  She is endorsing a right frontal forehead pain.  No overt headache, no neck pain, back pain, chest pain, abdominal pain, extremity pain.  HPI  Past Medical History Past Medical History:  Diagnosis Date  . Hypertension    There are no problems to display for this patient.  Home Medication(s) Prior to Admission medications   Not on File                                                                                                                                    Past Surgical History ** The histories are not reviewed yet. Please review them in the "History" navigator section and refresh this [place]. Family History No family history on file.  Social History Social History   Tobacco Use  . Smoking status: Never Smoker  . Smokeless tobacco: Never Used  Substance Use Topics  . Alcohol use: Yes  . Drug use: Not on file   Allergies Penicillins  Review of Systems Review of Systems All other systems are reviewed and are negative for acute change except as noted in the HPI  Physical Exam Vital Signs  I have reviewed the triage vital signs BP (!) 164/92   Pulse 60   Temp (!) 97.5 F (36.4 C)   Resp 18   SpO2 96%   Physical Exam Constitutional:      General: He is not in acute distress.    Appearance: He is  well-developed. He is not diaphoretic.  HENT:     Head: Normocephalic.      Right Ear: External ear normal.     Left Ear: External ear normal.  Eyes:     General: No scleral icterus.       Right eye: No discharge.        Left eye: No discharge.     Conjunctiva/sclera: Conjunctivae normal.     Pupils: Pupils are equal, round, and reactive to light.  Cardiovascular:     Rate and Rhythm: Regular rhythm.     Pulses:          Radial pulses are 2+ on the right side and 2+ on the left side.       Dorsalis  pedis pulses are 2+ on the right side and 2+ on the left side.     Heart sounds: Normal heart sounds. No murmur. No friction rub. No gallop.   Pulmonary:     Effort: Pulmonary effort is normal. No respiratory distress.     Breath sounds: Normal breath sounds. No stridor.  Abdominal:     General: There is no distension.     Palpations: Abdomen is soft.     Tenderness: There is no abdominal tenderness.  Musculoskeletal:     Cervical back: Normal range of motion and neck supple. No bony tenderness.     Thoracic back: No bony tenderness.     Lumbar back: No bony tenderness.     Comments: Clavicle stable. Chest stable to AP/Lat compression. Pelvis stable to Lat compression. No obvious extremity deformity. No chest or abdominal wall contusion.  Skin:    General: Skin is warm.  Neurological:     Mental Status: He is alert and oriented to person, place, and time.     GCS: GCS eye subscore is 4. GCS verbal subscore is 5. GCS motor subscore is 6.     Comments: Moving all extremities      ED Results and Treatments Labs (all labs ordered are listed, but only abnormal results are displayed) Labs Reviewed  CBC WITH DIFFERENTIAL/PLATELET - Abnormal; Notable for the following components:      Result Value   Platelets 134 (*)    All other components within normal limits  BASIC METABOLIC PANEL - Abnormal; Notable for the following components:   Potassium 3.4 (*)    All other components  within normal limits                                                                                                                         EKG  EKG Interpretation  Date/Time:    Ventricular Rate:    PR Interval:    QRS Duration:   QT Interval:    QTC Calculation:   R Axis:     Text Interpretation:        Radiology CT Head Wo Contrast  Result Date: 04/29/2019 CLINICAL DATA:  Fall with positive head strike EXAM: CT HEAD WITHOUT CONTRAST TECHNIQUE: Contiguous axial images were obtained from the base of the skull through the vertex without intravenous contrast. COMPARISON:  None. FINDINGS: Brain: No evidence of acute infarction, hemorrhage, hydrocephalus, extra-axial collection or mass lesion/mass effect. Symmetric prominence of the ventricles, cisterns and sulci compatible with parenchymal volume loss. Patchy areas of white matter hypoattenuation are most compatible with chronic microvascular angiopathy. Benign senescent mineralization of the globus pallidi. Vascular: Atherosclerotic calcification of the carotid siphons and intradural vertebral arteries. No hyperdense vessel. Skull: Right frontal and supraorbital soft tissue swelling and laceration with small amount of subcutaneous gas. No subjacent calvarial or visible facial bone fracture in the immediate vicinity. There are remote appearing bilateral nasal bone fractures without adjacent swelling which favor chronicity. No acute  or suspicious osseous lesions. Sinuses/Orbits: Opacification of the left sphenoid sinus and lateral recess with pneumatized secretions may reflect acute on chronic sinusitis. Right periorbital soft tissue swelling without retro septal gas, stranding or hemorrhage. Prior right lens extraction. The globes appear normal and symmetric. Symmetric appearance of the extraocular musculature and optic nerve sheath complexes. Normal caliber of the superior ophthalmic veins. Other: None IMPRESSION: 1. No acute intracranial  abnormality. 2. Right frontal and supraorbital soft tissue swelling and laceration with small amount of subcutaneous gas. No subjacent calvarial or visible facial bone fracture in the immediate vicinity. No globe or retro septal orbital injury identified. 3. Bilateral nasal bone fractures without adjacent swelling which favor remote deformity. 4. Opacification of the left sphenoid sinus and lateral recess with pneumatized secretions may reflect acute on chronic sinusitis. Electronically Signed   By: Lovena Le M.D.   On: 04/29/2019 03:45    Pertinent labs & imaging results that were available during my care of the patient were reviewed by me and considered in my medical decision making (see chart for details).  Medications Ordered in ED Medications  lidocaine-EPINEPHrine (XYLOCAINE W/EPI) 2 %-1:200000 (PF) injection 20 mL (20 mLs Intradermal Given 04/29/19 0617)  Tdap (BOOSTRIX) injection 0.5 mL (0.5 mLs Intramuscular Given 04/29/19 0609)  acetaminophen (TYLENOL) tablet 1,000 mg (1,000 mg Oral Given 04/29/19 7672)                                                                                                                                    Procedures Procedures  (including critical care time)  Medical Decision Making / ED Course I have reviewed the nursing notes for this encounter and the patient's prior records (if available in EHR or on provided paperwork).   Deborah Tucker was evaluated in Emergency Department on 04/29/2019 for the symptoms described in the history of present illness. She was evaluated in the context of the global COVID-19 pandemic, which necessitated consideration that the patient might be at risk for infection with the SARS-CoV-2 virus that causes COVID-19. Institutional protocols and algorithms that pertain to the evaluation of patients at risk for COVID-19 are in a state of rapid change based on information released by regulatory bodies including the CDC and federal and  state organizations. These policies and algorithms were followed during the patient's care in the ED.  Mechanical fall resulting in head trauma and forehead laceration.  CT head and cervical spine negative.  Labs reassuring.  Wound thoroughly irrigated and closed by APP.  Tetanus updated.  Patient able to ambulate with without significant complication.      Final Clinical Impression(s) / ED Diagnoses Final diagnoses:  Fall, initial encounter  Minor head injury, initial encounter  Laceration of forehead, initial encounter   The patient appears reasonably screened and/or stabilized for discharge and I doubt any other medical condition or other [facility] requiring further screening, evaluation, or treatment in the ED at  this time prior to discharge. Safe for discharge with strict return precautions.  Disposition: Discharge  Condition: Good  I have discussed the results, Dx and Tx plan with the patient/family who expressed understanding and agree(s) with the plan. Discharge instructions discussed at length. The patient/family was given strict return precautions who verbalized understanding of the instructions. No further questions at time of discharge.    ED Discharge Orders    None        Follow Up: [facility] [address] [id number] [place] [phone]  in 5-7 days, For suture removal      This chart was dictated using voice recognition software.  Despite best efforts to proofread,  errors can occur which can change the documentation meaning.   Fatima Blank, MD 04/29/19 [phone]

## 2019-04-30 ENCOUNTER — Encounter (HOSPITAL_COMMUNITY): Payer: Self-pay

## 2019-05-23 DIAGNOSIS — R112 Nausea with vomiting, unspecified: Secondary | ICD-10-CM | POA: Diagnosis not present

## 2019-05-23 DIAGNOSIS — E871 Hypo-osmolality and hyponatremia: Secondary | ICD-10-CM | POA: Diagnosis not present

## 2019-05-23 DIAGNOSIS — R4182 Altered mental status, unspecified: Secondary | ICD-10-CM | POA: Diagnosis not present

## 2019-05-23 DIAGNOSIS — Z8673 Personal history of transient ischemic attack (TIA), and cerebral infarction without residual deficits: Secondary | ICD-10-CM | POA: Diagnosis not present

## 2019-06-12 DIAGNOSIS — R2681 Unsteadiness on feet: Secondary | ICD-10-CM | POA: Diagnosis not present

## 2019-06-12 DIAGNOSIS — Z90710 Acquired absence of both cervix and uterus: Secondary | ICD-10-CM | POA: Diagnosis not present

## 2019-06-12 DIAGNOSIS — M6281 Muscle weakness (generalized): Secondary | ICD-10-CM | POA: Diagnosis not present

## 2019-06-12 DIAGNOSIS — E039 Hypothyroidism, unspecified: Secondary | ICD-10-CM | POA: Diagnosis not present

## 2019-06-12 DIAGNOSIS — Z9181 History of falling: Secondary | ICD-10-CM | POA: Diagnosis not present

## 2019-06-20 DIAGNOSIS — R2681 Unsteadiness on feet: Secondary | ICD-10-CM | POA: Diagnosis not present

## 2019-06-20 DIAGNOSIS — Z90710 Acquired absence of both cervix and uterus: Secondary | ICD-10-CM | POA: Diagnosis not present

## 2019-06-20 DIAGNOSIS — E039 Hypothyroidism, unspecified: Secondary | ICD-10-CM | POA: Diagnosis not present

## 2019-06-20 DIAGNOSIS — Z9181 History of falling: Secondary | ICD-10-CM | POA: Diagnosis not present

## 2019-06-20 DIAGNOSIS — M6281 Muscle weakness (generalized): Secondary | ICD-10-CM | POA: Diagnosis not present

## 2019-06-25 DIAGNOSIS — R2681 Unsteadiness on feet: Secondary | ICD-10-CM | POA: Diagnosis not present

## 2019-06-25 DIAGNOSIS — E039 Hypothyroidism, unspecified: Secondary | ICD-10-CM | POA: Diagnosis not present

## 2019-06-25 DIAGNOSIS — Z90710 Acquired absence of both cervix and uterus: Secondary | ICD-10-CM | POA: Diagnosis not present

## 2019-06-25 DIAGNOSIS — M6281 Muscle weakness (generalized): Secondary | ICD-10-CM | POA: Diagnosis not present

## 2019-06-25 DIAGNOSIS — Z9181 History of falling: Secondary | ICD-10-CM | POA: Diagnosis not present

## 2019-06-27 DIAGNOSIS — Z90710 Acquired absence of both cervix and uterus: Secondary | ICD-10-CM | POA: Diagnosis not present

## 2019-06-27 DIAGNOSIS — R2681 Unsteadiness on feet: Secondary | ICD-10-CM | POA: Diagnosis not present

## 2019-06-27 DIAGNOSIS — Z9181 History of falling: Secondary | ICD-10-CM | POA: Diagnosis not present

## 2019-06-27 DIAGNOSIS — E039 Hypothyroidism, unspecified: Secondary | ICD-10-CM | POA: Diagnosis not present

## 2019-06-27 DIAGNOSIS — M6281 Muscle weakness (generalized): Secondary | ICD-10-CM | POA: Diagnosis not present

## 2019-07-02 DIAGNOSIS — Z9181 History of falling: Secondary | ICD-10-CM | POA: Diagnosis not present

## 2019-07-02 DIAGNOSIS — E039 Hypothyroidism, unspecified: Secondary | ICD-10-CM | POA: Diagnosis not present

## 2019-07-02 DIAGNOSIS — R2681 Unsteadiness on feet: Secondary | ICD-10-CM | POA: Diagnosis not present

## 2019-07-02 DIAGNOSIS — M6281 Muscle weakness (generalized): Secondary | ICD-10-CM | POA: Diagnosis not present

## 2019-07-02 DIAGNOSIS — Z90710 Acquired absence of both cervix and uterus: Secondary | ICD-10-CM | POA: Diagnosis not present

## 2019-07-05 DIAGNOSIS — Z90710 Acquired absence of both cervix and uterus: Secondary | ICD-10-CM | POA: Diagnosis not present

## 2019-07-05 DIAGNOSIS — E039 Hypothyroidism, unspecified: Secondary | ICD-10-CM | POA: Diagnosis not present

## 2019-07-05 DIAGNOSIS — M6281 Muscle weakness (generalized): Secondary | ICD-10-CM | POA: Diagnosis not present

## 2019-07-05 DIAGNOSIS — R2681 Unsteadiness on feet: Secondary | ICD-10-CM | POA: Diagnosis not present

## 2019-07-05 DIAGNOSIS — Z9181 History of falling: Secondary | ICD-10-CM | POA: Diagnosis not present

## 2019-07-09 DIAGNOSIS — M6281 Muscle weakness (generalized): Secondary | ICD-10-CM | POA: Diagnosis not present

## 2019-07-09 DIAGNOSIS — Z90710 Acquired absence of both cervix and uterus: Secondary | ICD-10-CM | POA: Diagnosis not present

## 2019-07-09 DIAGNOSIS — Z9181 History of falling: Secondary | ICD-10-CM | POA: Diagnosis not present

## 2019-07-09 DIAGNOSIS — E039 Hypothyroidism, unspecified: Secondary | ICD-10-CM | POA: Diagnosis not present

## 2019-07-09 DIAGNOSIS — R2681 Unsteadiness on feet: Secondary | ICD-10-CM | POA: Diagnosis not present

## 2019-07-11 DIAGNOSIS — E039 Hypothyroidism, unspecified: Secondary | ICD-10-CM | POA: Diagnosis not present

## 2019-07-11 DIAGNOSIS — Z90710 Acquired absence of both cervix and uterus: Secondary | ICD-10-CM | POA: Diagnosis not present

## 2019-07-11 DIAGNOSIS — R2681 Unsteadiness on feet: Secondary | ICD-10-CM | POA: Diagnosis not present

## 2019-07-11 DIAGNOSIS — M6281 Muscle weakness (generalized): Secondary | ICD-10-CM | POA: Diagnosis not present

## 2019-07-11 DIAGNOSIS — Z9181 History of falling: Secondary | ICD-10-CM | POA: Diagnosis not present

## 2019-07-16 DIAGNOSIS — Z9181 History of falling: Secondary | ICD-10-CM | POA: Diagnosis not present

## 2019-07-16 DIAGNOSIS — E785 Hyperlipidemia, unspecified: Secondary | ICD-10-CM | POA: Diagnosis not present

## 2019-07-16 DIAGNOSIS — M6281 Muscle weakness (generalized): Secondary | ICD-10-CM | POA: Diagnosis not present

## 2019-07-16 DIAGNOSIS — E039 Hypothyroidism, unspecified: Secondary | ICD-10-CM | POA: Diagnosis not present

## 2019-07-16 DIAGNOSIS — Z90710 Acquired absence of both cervix and uterus: Secondary | ICD-10-CM | POA: Diagnosis not present

## 2019-07-18 DIAGNOSIS — E785 Hyperlipidemia, unspecified: Secondary | ICD-10-CM | POA: Diagnosis not present

## 2019-07-18 DIAGNOSIS — M6281 Muscle weakness (generalized): Secondary | ICD-10-CM | POA: Diagnosis not present

## 2019-07-18 DIAGNOSIS — E039 Hypothyroidism, unspecified: Secondary | ICD-10-CM | POA: Diagnosis not present

## 2019-07-18 DIAGNOSIS — Z90710 Acquired absence of both cervix and uterus: Secondary | ICD-10-CM | POA: Diagnosis not present

## 2019-07-18 DIAGNOSIS — Z9181 History of falling: Secondary | ICD-10-CM | POA: Diagnosis not present

## 2019-07-26 DIAGNOSIS — Z90710 Acquired absence of both cervix and uterus: Secondary | ICD-10-CM | POA: Diagnosis not present

## 2019-07-26 DIAGNOSIS — M6281 Muscle weakness (generalized): Secondary | ICD-10-CM | POA: Diagnosis not present

## 2019-07-26 DIAGNOSIS — Z9181 History of falling: Secondary | ICD-10-CM | POA: Diagnosis not present

## 2019-07-26 DIAGNOSIS — E039 Hypothyroidism, unspecified: Secondary | ICD-10-CM | POA: Diagnosis not present

## 2019-07-26 DIAGNOSIS — E785 Hyperlipidemia, unspecified: Secondary | ICD-10-CM | POA: Diagnosis not present

## 2019-08-14 DIAGNOSIS — G4733 Obstructive sleep apnea (adult) (pediatric): Secondary | ICD-10-CM | POA: Diagnosis not present

## 2019-08-14 DIAGNOSIS — E039 Hypothyroidism, unspecified: Secondary | ICD-10-CM | POA: Diagnosis not present

## 2019-08-14 DIAGNOSIS — E785 Hyperlipidemia, unspecified: Secondary | ICD-10-CM | POA: Diagnosis not present

## 2019-08-14 DIAGNOSIS — I10 Essential (primary) hypertension: Secondary | ICD-10-CM | POA: Diagnosis not present

## 2019-08-14 DIAGNOSIS — R7301 Impaired fasting glucose: Secondary | ICD-10-CM | POA: Diagnosis not present

## 2019-08-14 DIAGNOSIS — D509 Iron deficiency anemia, unspecified: Secondary | ICD-10-CM | POA: Diagnosis not present

## 2019-08-14 DIAGNOSIS — D519 Vitamin B12 deficiency anemia, unspecified: Secondary | ICD-10-CM | POA: Diagnosis not present

## 2019-08-30 DIAGNOSIS — L570 Actinic keratosis: Secondary | ICD-10-CM | POA: Diagnosis not present

## 2019-08-30 DIAGNOSIS — Z85828 Personal history of other malignant neoplasm of skin: Secondary | ICD-10-CM | POA: Diagnosis not present

## 2019-08-30 DIAGNOSIS — L814 Other melanin hyperpigmentation: Secondary | ICD-10-CM | POA: Diagnosis not present

## 2019-08-30 DIAGNOSIS — L821 Other seborrheic keratosis: Secondary | ICD-10-CM | POA: Diagnosis not present

## 2019-08-30 DIAGNOSIS — L918 Other hypertrophic disorders of the skin: Secondary | ICD-10-CM | POA: Diagnosis not present

## 2019-08-30 DIAGNOSIS — D485 Neoplasm of uncertain behavior of skin: Secondary | ICD-10-CM | POA: Diagnosis not present

## 2019-08-30 DIAGNOSIS — I8392 Asymptomatic varicose veins of left lower extremity: Secondary | ICD-10-CM | POA: Diagnosis not present

## 2019-08-30 DIAGNOSIS — D0462 Carcinoma in situ of skin of left upper limb, including shoulder: Secondary | ICD-10-CM | POA: Diagnosis not present

## 2019-08-30 DIAGNOSIS — I8391 Asymptomatic varicose veins of right lower extremity: Secondary | ICD-10-CM | POA: Diagnosis not present

## 2019-08-30 DIAGNOSIS — D2272 Melanocytic nevi of left lower limb, including hip: Secondary | ICD-10-CM | POA: Diagnosis not present

## 2019-10-08 DIAGNOSIS — J301 Allergic rhinitis due to pollen: Secondary | ICD-10-CM | POA: Diagnosis not present

## 2019-10-08 DIAGNOSIS — J3089 Other allergic rhinitis: Secondary | ICD-10-CM | POA: Diagnosis not present

## 2019-10-23 DIAGNOSIS — J301 Allergic rhinitis due to pollen: Secondary | ICD-10-CM | POA: Diagnosis not present

## 2019-10-25 DIAGNOSIS — Z23 Encounter for immunization: Secondary | ICD-10-CM | POA: Diagnosis not present

## 2019-10-29 DIAGNOSIS — H353112 Nonexudative age-related macular degeneration, right eye, intermediate dry stage: Secondary | ICD-10-CM | POA: Diagnosis not present

## 2019-10-29 DIAGNOSIS — H2512 Age-related nuclear cataract, left eye: Secondary | ICD-10-CM | POA: Diagnosis not present

## 2019-10-29 DIAGNOSIS — H349 Unspecified retinal vascular occlusion: Secondary | ICD-10-CM | POA: Diagnosis not present

## 2019-10-29 DIAGNOSIS — H401112 Primary open-angle glaucoma, right eye, moderate stage: Secondary | ICD-10-CM | POA: Diagnosis not present

## 2019-11-26 DIAGNOSIS — Z23 Encounter for immunization: Secondary | ICD-10-CM | POA: Diagnosis not present

## 2020-03-25 DIAGNOSIS — E039 Hypothyroidism, unspecified: Secondary | ICD-10-CM | POA: Diagnosis not present

## 2020-03-25 DIAGNOSIS — R7301 Impaired fasting glucose: Secondary | ICD-10-CM | POA: Diagnosis not present

## 2020-03-25 DIAGNOSIS — E785 Hyperlipidemia, unspecified: Secondary | ICD-10-CM | POA: Diagnosis not present

## 2020-03-25 DIAGNOSIS — D519 Vitamin B12 deficiency anemia, unspecified: Secondary | ICD-10-CM | POA: Diagnosis not present

## 2020-03-25 DIAGNOSIS — M859 Disorder of bone density and structure, unspecified: Secondary | ICD-10-CM | POA: Diagnosis not present

## 2020-04-01 DIAGNOSIS — D519 Vitamin B12 deficiency anemia, unspecified: Secondary | ICD-10-CM | POA: Diagnosis not present

## 2020-04-01 DIAGNOSIS — N182 Chronic kidney disease, stage 2 (mild): Secondary | ICD-10-CM | POA: Diagnosis not present

## 2020-04-01 DIAGNOSIS — I129 Hypertensive chronic kidney disease with stage 1 through stage 4 chronic kidney disease, or unspecified chronic kidney disease: Secondary | ICD-10-CM | POA: Diagnosis not present

## 2020-04-01 DIAGNOSIS — R82998 Other abnormal findings in urine: Secondary | ICD-10-CM | POA: Diagnosis not present

## 2020-04-01 DIAGNOSIS — G252 Other specified forms of tremor: Secondary | ICD-10-CM | POA: Diagnosis not present

## 2020-04-01 DIAGNOSIS — Z0000 Encounter for general adult medical examination without abnormal findings: Secondary | ICD-10-CM | POA: Diagnosis not present

## 2020-04-01 DIAGNOSIS — M8580 Other specified disorders of bone density and structure, unspecified site: Secondary | ICD-10-CM | POA: Diagnosis not present

## 2020-04-01 DIAGNOSIS — E785 Hyperlipidemia, unspecified: Secondary | ICD-10-CM | POA: Diagnosis not present

## 2020-04-01 DIAGNOSIS — Z79899 Other long term (current) drug therapy: Secondary | ICD-10-CM | POA: Diagnosis not present

## 2020-04-01 DIAGNOSIS — E039 Hypothyroidism, unspecified: Secondary | ICD-10-CM | POA: Diagnosis not present

## 2020-04-01 DIAGNOSIS — D696 Thrombocytopenia, unspecified: Secondary | ICD-10-CM | POA: Diagnosis not present

## 2020-04-01 DIAGNOSIS — I2510 Atherosclerotic heart disease of native coronary artery without angina pectoris: Secondary | ICD-10-CM | POA: Diagnosis not present

## 2020-04-01 DIAGNOSIS — I6389 Other cerebral infarction: Secondary | ICD-10-CM | POA: Diagnosis not present

## 2020-05-07 DIAGNOSIS — Z20822 Contact with and (suspected) exposure to covid-19: Secondary | ICD-10-CM | POA: Diagnosis not present

## 2020-05-15 DIAGNOSIS — J301 Allergic rhinitis due to pollen: Secondary | ICD-10-CM | POA: Diagnosis not present

## 2020-05-19 DIAGNOSIS — Z23 Encounter for immunization: Secondary | ICD-10-CM | POA: Diagnosis not present

## 2020-06-11 DIAGNOSIS — E785 Hyperlipidemia, unspecified: Secondary | ICD-10-CM | POA: Diagnosis not present

## 2020-07-11 DIAGNOSIS — H401113 Primary open-angle glaucoma, right eye, severe stage: Secondary | ICD-10-CM | POA: Diagnosis not present

## 2020-07-15 DIAGNOSIS — F329 Major depressive disorder, single episode, unspecified: Secondary | ICD-10-CM | POA: Diagnosis not present

## 2020-07-15 DIAGNOSIS — R413 Other amnesia: Secondary | ICD-10-CM | POA: Diagnosis not present

## 2020-07-15 DIAGNOSIS — I6389 Other cerebral infarction: Secondary | ICD-10-CM | POA: Diagnosis not present

## 2020-07-28 DIAGNOSIS — Z20822 Contact with and (suspected) exposure to covid-19: Secondary | ICD-10-CM | POA: Diagnosis not present

## 2020-08-04 DIAGNOSIS — R41841 Cognitive communication deficit: Secondary | ICD-10-CM | POA: Diagnosis not present

## 2020-08-07 DIAGNOSIS — R41841 Cognitive communication deficit: Secondary | ICD-10-CM | POA: Diagnosis not present

## 2020-08-11 DIAGNOSIS — Z20822 Contact with and (suspected) exposure to covid-19: Secondary | ICD-10-CM | POA: Diagnosis not present

## 2020-08-12 DIAGNOSIS — R41841 Cognitive communication deficit: Secondary | ICD-10-CM | POA: Diagnosis not present

## 2020-08-14 DIAGNOSIS — R41841 Cognitive communication deficit: Secondary | ICD-10-CM | POA: Diagnosis not present

## 2020-08-19 DIAGNOSIS — R41841 Cognitive communication deficit: Secondary | ICD-10-CM | POA: Diagnosis not present

## 2020-08-21 DIAGNOSIS — R41841 Cognitive communication deficit: Secondary | ICD-10-CM | POA: Diagnosis not present

## 2020-08-26 DIAGNOSIS — R41841 Cognitive communication deficit: Secondary | ICD-10-CM | POA: Diagnosis not present

## 2020-08-28 DIAGNOSIS — R41841 Cognitive communication deficit: Secondary | ICD-10-CM | POA: Diagnosis not present

## 2020-09-02 DIAGNOSIS — R41841 Cognitive communication deficit: Secondary | ICD-10-CM | POA: Diagnosis not present

## 2020-09-04 DIAGNOSIS — R41841 Cognitive communication deficit: Secondary | ICD-10-CM | POA: Diagnosis not present

## 2020-09-11 DIAGNOSIS — R41841 Cognitive communication deficit: Secondary | ICD-10-CM | POA: Diagnosis not present

## 2020-09-16 DIAGNOSIS — R41841 Cognitive communication deficit: Secondary | ICD-10-CM | POA: Diagnosis not present

## 2020-09-18 DIAGNOSIS — R41841 Cognitive communication deficit: Secondary | ICD-10-CM | POA: Diagnosis not present

## 2020-09-22 DIAGNOSIS — Z20822 Contact with and (suspected) exposure to covid-19: Secondary | ICD-10-CM | POA: Diagnosis not present

## 2020-09-23 DIAGNOSIS — R41841 Cognitive communication deficit: Secondary | ICD-10-CM | POA: Diagnosis not present

## 2020-09-24 DIAGNOSIS — Z20822 Contact with and (suspected) exposure to covid-19: Secondary | ICD-10-CM | POA: Diagnosis not present

## 2020-09-25 DIAGNOSIS — R41841 Cognitive communication deficit: Secondary | ICD-10-CM | POA: Diagnosis not present

## 2020-09-30 DIAGNOSIS — Z20822 Contact with and (suspected) exposure to covid-19: Secondary | ICD-10-CM | POA: Diagnosis not present

## 2020-09-30 DIAGNOSIS — R41841 Cognitive communication deficit: Secondary | ICD-10-CM | POA: Diagnosis not present

## 2020-10-01 DIAGNOSIS — Z23 Encounter for immunization: Secondary | ICD-10-CM | POA: Diagnosis not present

## 2020-10-06 DIAGNOSIS — J3089 Other allergic rhinitis: Secondary | ICD-10-CM | POA: Diagnosis not present

## 2020-10-06 DIAGNOSIS — J301 Allergic rhinitis due to pollen: Secondary | ICD-10-CM | POA: Diagnosis not present

## 2020-10-07 DIAGNOSIS — E039 Hypothyroidism, unspecified: Secondary | ICD-10-CM | POA: Diagnosis not present

## 2020-10-07 DIAGNOSIS — D509 Iron deficiency anemia, unspecified: Secondary | ICD-10-CM | POA: Diagnosis not present

## 2020-10-07 DIAGNOSIS — R413 Other amnesia: Secondary | ICD-10-CM | POA: Diagnosis not present

## 2020-10-07 DIAGNOSIS — L602 Onychogryphosis: Secondary | ICD-10-CM | POA: Diagnosis not present

## 2020-10-07 DIAGNOSIS — M8580 Other specified disorders of bone density and structure, unspecified site: Secondary | ICD-10-CM | POA: Diagnosis not present

## 2020-10-07 DIAGNOSIS — I6389 Other cerebral infarction: Secondary | ICD-10-CM | POA: Diagnosis not present

## 2020-10-07 DIAGNOSIS — I129 Hypertensive chronic kidney disease with stage 1 through stage 4 chronic kidney disease, or unspecified chronic kidney disease: Secondary | ICD-10-CM | POA: Diagnosis not present

## 2020-10-07 DIAGNOSIS — D696 Thrombocytopenia, unspecified: Secondary | ICD-10-CM | POA: Diagnosis not present

## 2020-10-07 DIAGNOSIS — F329 Major depressive disorder, single episode, unspecified: Secondary | ICD-10-CM | POA: Diagnosis not present

## 2020-10-07 DIAGNOSIS — R7301 Impaired fasting glucose: Secondary | ICD-10-CM | POA: Diagnosis not present

## 2020-10-07 DIAGNOSIS — D519 Vitamin B12 deficiency anemia, unspecified: Secondary | ICD-10-CM | POA: Diagnosis not present

## 2020-10-07 DIAGNOSIS — I2510 Atherosclerotic heart disease of native coronary artery without angina pectoris: Secondary | ICD-10-CM | POA: Diagnosis not present

## 2020-10-09 DIAGNOSIS — R41841 Cognitive communication deficit: Secondary | ICD-10-CM | POA: Diagnosis not present

## 2020-10-16 DIAGNOSIS — Z23 Encounter for immunization: Secondary | ICD-10-CM | POA: Diagnosis not present

## 2020-10-21 ENCOUNTER — Other Ambulatory Visit: Payer: Self-pay

## 2020-10-21 ENCOUNTER — Encounter: Payer: Self-pay | Admitting: Podiatry

## 2020-10-21 ENCOUNTER — Ambulatory Visit (INDEPENDENT_AMBULATORY_CARE_PROVIDER_SITE_OTHER): Payer: Medicare Other | Admitting: Podiatry

## 2020-10-21 DIAGNOSIS — M79674 Pain in right toe(s): Secondary | ICD-10-CM | POA: Diagnosis not present

## 2020-10-21 DIAGNOSIS — L603 Nail dystrophy: Secondary | ICD-10-CM

## 2020-10-21 DIAGNOSIS — R7303 Prediabetes: Secondary | ICD-10-CM | POA: Diagnosis not present

## 2020-10-21 DIAGNOSIS — B351 Tinea unguium: Secondary | ICD-10-CM | POA: Diagnosis not present

## 2020-10-21 DIAGNOSIS — M79675 Pain in left toe(s): Secondary | ICD-10-CM | POA: Diagnosis not present

## 2020-10-21 NOTE — Progress Notes (Signed)
  Subjective:  Patient ID: Deborah Tucker, female    DOB: 30-Nov-1930,   MRN: [id number]  No chief complaint on file.   87 y.o. female presents for bilateral thickened and elongated nails. Relates they are painful and hard for her to cut. Was recently diagnosed with pre-diabetes and referred here by PCP Last A1c was unknown. Denies any other pedal complaints. Denies n/v/f/c.   PCP Crist Infante, MD Last seen 04/28/19  Past Medical History:  Diagnosis Date   B12 deficiency    Bullous pemphigoid    Central retinal vein occlusion, left eye    Left eye blind   Cerebrovascular disease, unspecified    MRI indicates multiple small cortical infarcts.   Concussion    H/o fall with Skull fracture & concussion   DJD (degenerative joint disease), lumbar    Glaucoma, right eye    Hyperlipidemia    Hypertension    IFG (impaired fasting glucose)    Migraine headache    OSA (obstructive sleep apnea)    Intolerant of CPAP. Is supposed to wear nocturnal oxygen, but does not   Osteoarthritis of left knee    Osteopenia    Scoliosis deformity of spine    Seasonal allergies    Skull fracture (HCC)    Wrist fracture 2015   R wrist    Objective:  Physical Exam: Vascular: DP/PT pulses 2/4 bilateral. CFT <3 seconds. Normal hair growth on digits. No edema.  Skin. No lacerations or abrasions bilateral feet. Nails 1-5 are thickened discolored and elongated with subungual debris.  Musculoskeletal: MMT 5/5 bilateral lower extremities in DF, PF, Inversion and Eversion. Deceased ROM in DF of ankle joint.  Neurological: Sensation intact to light touch.   Assessment:   1. Onychodystrophy   2. Pain due to onychomycosis of toenails of both feet   3. Pre-diabetes      Plan:  Patient was evaluated and treated and all questions answered. -Discussed and educated patient on diabetic foot care, especially with  regards to the vascular, neurological and musculoskeletal systems.  -Stressed the importance of  good glycemic control and the detriment of not  controlling glucose levels in relation to the foot. -Discussed supportive shoes at all times and checking feet regularly.  -Mechanically debrided all nails 1-5 bilateral using sterile nail nipper and filed with dremel without incident  -Answered all patient questions -Patient to return  in 3 months for at risk foot care -Patient advised to call the office if any problems or questions arise in the meantime.   Lorenda Peck, DPM

## 2020-11-03 DIAGNOSIS — Z20822 Contact with and (suspected) exposure to covid-19: Secondary | ICD-10-CM | POA: Diagnosis not present

## 2020-11-26 DIAGNOSIS — I6389 Other cerebral infarction: Secondary | ICD-10-CM | POA: Diagnosis not present

## 2020-11-26 DIAGNOSIS — F329 Major depressive disorder, single episode, unspecified: Secondary | ICD-10-CM | POA: Diagnosis not present

## 2020-11-26 DIAGNOSIS — R413 Other amnesia: Secondary | ICD-10-CM | POA: Diagnosis not present

## 2020-11-26 DIAGNOSIS — Z022 Encounter for examination for admission to residential institution: Secondary | ICD-10-CM | POA: Diagnosis not present

## 2020-12-11 ENCOUNTER — Encounter: Payer: Self-pay | Admitting: Internal Medicine

## 2020-12-11 NOTE — Progress Notes (Signed)
Provider:  Veleta Miners MD Location:   [place] Room Number: 25 Place of Service:  ALF ([id number])  PCP: Crist Infante, MD Patient Care Team: Crist Infante, MD as PCP - General (Internal Medicine) Crist Infante, MD (Internal Medicine)  Extended Emergency Contact Information Primary Emergency Contact: Jeris Penta Address: [address]          [place] [address] [place] of [place] Phone: [phone] Relation: Daughter Secondary Emergency Contact: Jeris Penta Mobile Phone: [phone] Relation: Daughter  Code Status: Full Code Goals of Care: Advanced Directive information Advanced Directives 04/29/2019  Does Patient Have a Medical Advance Directive? Yes  Type of Advance Directive Living will;Healthcare Power of [place] in Chart? -      Chief Complaint  Patient presents with   New Admit To SNF    Admission to AL    HPI: Patient is a 87 y.o. female seen today for admission to  Past Medical History:  Diagnosis Date   B12 deficiency    Bullous pemphigoid    Central retinal vein occlusion, left eye    Left eye blind   Cerebrovascular disease, unspecified    MRI indicates multiple small cortical infarcts.   Concussion    H/o fall with Skull fracture & concussion   DJD (degenerative joint disease), lumbar    Glaucoma, right eye    Hyperlipidemia    Hypertension    IFG (impaired fasting glucose)    Migraine headache    OSA (obstructive sleep apnea)    Intolerant of CPAP. Is supposed to wear nocturnal oxygen, but does not   Osteoarthritis of left knee    Osteopenia    Scoliosis deformity of spine    Seasonal allergies    Skull fracture (HCC)    Wrist fracture 2015   R wrist   Past Surgical History:  Procedure Laterality Date   ANKLE FRACTURE SURGERY Left    NM MYOVIEW LTD  10/2016   Only exercised 3: 34 min.  4.6 METs normal heart rate response.  Baseline hypertension.  Normal EF   65-70%.  No regional wall motion.  No ischemia or infarction.  Poor exercise tolerance.  No chronotropic incompetence.   OVARIAN CYST REMOVAL     VAGINAL HYSTERECTOMY      reports that she has never smoked. She has never used smokeless tobacco. She reports current alcohol use. She reports that she does not use drugs. Social History   Socioeconomic History   Marital status: Widowed    Spouse name: Not on file   Number of children: 2   Years of education: 60   Highest education level: Not on file  Occupational History   Occupation: retired.  Tobacco Use   Smoking status: Never   Smokeless tobacco: Never  Vaping Use   Vaping Use: Never used  Substance and Sexual Activity   Alcohol use: Yes    Comment: 1-2 glasses   Drug use: No   Sexual activity: Never  Other Topics Concern   Not on file  Social History Narrative   ** Merged History Encounter **       Widowed, then divorced and remarried in 2007 to [place] (-- he subsequently developed dementia) - now divorced from [place].  2 daughters - [name] (youngest, Lives in [place], [place]), [name] (older - lives in [place], [place]).  5 GC - (1 boy, 4 girls) 1    yr college, Retired. Worked as a  Training and development officer.  1-2 glasses of wine/day.   Social Determinants of Health   Financial Resource Strain: Not on file  Food Insecurity: Not on file  Transportation Needs: Not on file  Physical Activity: Not on file  Stress: Not on file  Social Connections: Not on file  Intimate Partner Violence: Not on file    Functional Status Survey:    Family History  Problem Relation Age of Onset   Lung cancer Father    Heart disease Father    Hypertension Father    COPD Father    Dementia Sister    Seizures Sister    Heart attack Sister 45       Damage was to the back of her heart.   AAA (abdominal aortic aneurysm) Sister    Hypertension Daughter    Hypertension Sister    Diabetes type II Sister    Dementia Maternal Grandmother      Health Maintenance  Topic Date Due   COVID-19 Vaccine (1) Never done   Pneumonia Vaccine 13+ Years old (1 - PCV) Never done   DEXA SCAN  Never done   INFLUENZA VACCINE  08/11/2020   TETANUS/TDAP  04/28/2029   Zoster Vaccines- Shingrix  Completed   HPV VACCINES  Aged Out    Allergies  Allergen Reactions   Penicillins Hives   Penicillins     Unk    Allergies as of 12/11/2020       Reactions   Penicillins Hives   Penicillins    Unk        Medication List        Accurate as of December 11, 2020 10:11 AM. If you have any questions, ask your nurse or doctor.          STOP taking these medications    cholecalciferol 1000 units tablet Commonly known as: VITAMIN D Stopped by: Virgie Dad, MD   colesevelam 625 MG tablet Commonly known as: WELCHOL Stopped by: Virgie Dad, MD   fexofenadine 30 MG tablet Commonly known as: ALLEGRA Stopped by: Virgie Dad, MD   Fish Oil 1000 MG Caps Stopped by: Virgie Dad, MD   Livalo 4 MG Tabs Generic drug: Pitavastatin Calcium Stopped by: Virgie Dad, MD       TAKE these medications    clopidogrel 75 MG tablet Commonly known as: PLAVIX Take 75 mg by mouth daily.   EPINEPHrine 0.3 mg/0.3 mL Soaj injection Commonly known as: EPI-PEN Inject 0.3 mg into the muscle as needed for anaphylaxis.   escitalopram 5 MG tablet Commonly known as: LEXAPRO Take 5 mg by mouth daily.   estrogens (conjugated) 0.3 MG tablet Commonly known as: PREMARIN Take 0.3 mg by mouth daily. Take daily for 21 days then do not take for 7 days.   Evolocumab 140 MG/ML Soaj Inject into the skin. Once A Day Every 14 Days   ezetimibe 10 MG tablet Commonly known as: ZETIA Take 10 mg by mouth daily.   furosemide 20 MG tablet Commonly known as: LASIX Take 20 mg by mouth. Once A Morning on Tue, Fri   galantamine 8 MG tablet Commonly known as: RAZADYNE Take 16 mg by mouth daily.   hydrALAZINE 25 MG tablet Commonly known as:  APRESOLINE Take 25 mg by mouth 2 (two) times daily.   ICaps Areds 2 Caps Take 1 capsule by mouth 2 (two) times daily.   latanoprost 0.005 % ophthalmic solution Commonly known as: XALATAN Place 1 drop  into both eyes daily.   levothyroxine 50 MCG tablet Commonly known as: SYNTHROID Take 2 tablets by mouth. On Saturday   levothyroxine 50 MCG tablet Commonly known as: SYNTHROID Take 50 mcg by mouth. Once A Day on Sun, Mon, Tue, Wed, Thu, Fri   memantine 10 MG tablet Commonly known as: NAMENDA Take 10 mg by mouth 2 (two) times daily.   metoprolol succinate 50 MG 24 hr tablet Commonly known as: TOPROL-XL Take 25 mg by mouth 2 (two) times daily.   olmesartan 40 MG tablet Commonly known as: BENICAR Take 40 mg by mouth daily.   pantoprazole 40 MG tablet Commonly known as: PROTONIX Take 40 mg by mouth daily.   [facility] Caps Take 1 capsule by mouth daily.   timolol 0.5 % ophthalmic solution Commonly known as: TIMOPTIC Place 1 drop into both eyes 2 (two) times daily.   vitamin B-12 100 MCG tablet Commonly known as: CYANOCOBALAMIN Take 100 mcg by mouth daily.   vitamin C 100 MG tablet Take 100 mg by mouth daily.   zinc oxide 20 % ointment Apply 1 application topically as needed for irritation.        Review of Systems  Vitals:   12/11/20 0902  BP: 140/74  Pulse: 70  Resp: 18  Temp: 97.7 F (36.5 C)  SpO2: 95%  Weight: 139 lb 9.6 oz (63.3 kg)  Height: 5' 2.5" (1.588 m)   Body mass index is 25.13 kg/m. Physical Exam  Labs reviewed: Basic Metabolic Panel: No results for input(s): NA, K, CL, CO2, GLUCOSE, BUN, CREATININE, CALCIUM, MG, PHOS in the last 8760 hours. Liver Function Tests: No results for input(s): AST, ALT, ALKPHOS, BILITOT, PROT, ALBUMIN in the last 8760 hours. No results for input(s): LIPASE, AMYLASE in the last 8760 hours. No results for input(s): AMMONIA in the last 8760 hours. CBC: No results for input(s): WBC, NEUTROABS,  HGB, HCT, MCV, PLT in the last 8760 hours. Cardiac Enzymes: No results for input(s): CKTOTAL, CKMB, CKMBINDEX, TROPONINI in the last 8760 hours. BNP: Invalid input(s): POCBNP No results found for: HGBA1C No results found for: TSH No results found for: VITAMINB12 No results found for: FOLATE No results found for: IRON, TIBC, FERRITIN  Imaging and Procedures obtained prior to SNF admission: CT Head Wo Contrast  Result Date: 04/29/2019 CLINICAL DATA:  Fall with positive head strike EXAM: CT HEAD WITHOUT CONTRAST TECHNIQUE: Contiguous axial images were obtained from the base of the skull through the vertex without intravenous contrast. COMPARISON:  None. FINDINGS: Brain: No evidence of acute infarction, hemorrhage, hydrocephalus, extra-axial collection or mass lesion/mass effect. Symmetric prominence of the ventricles, cisterns and sulci compatible with parenchymal volume loss. Patchy areas of white matter hypoattenuation are most compatible with chronic microvascular angiopathy. Benign senescent mineralization of the globus pallidi. Vascular: Atherosclerotic calcification of the carotid siphons and intradural vertebral arteries. No hyperdense vessel. Skull: Right frontal and supraorbital soft tissue swelling and laceration with small amount of subcutaneous gas. No subjacent calvarial or visible facial bone fracture in the immediate vicinity. There are remote appearing bilateral nasal bone fractures without adjacent swelling which favor chronicity. No acute or suspicious osseous lesions. Sinuses/Orbits: Opacification of the left sphenoid sinus and lateral recess with pneumatized secretions may reflect acute on chronic sinusitis. Right periorbital soft tissue swelling without retro septal gas, stranding or hemorrhage. Prior right lens extraction. The globes appear normal and symmetric. Symmetric appearance of the extraocular musculature and optic nerve sheath complexes. Normal caliber of the superior  ophthalmic veins. Other: None IMPRESSION: 1. No acute intracranial abnormality. 2. Right frontal and supraorbital soft tissue swelling and laceration with small amount of subcutaneous gas. No subjacent calvarial or visible facial bone fracture in the immediate vicinity. No globe or retro septal orbital injury identified. 3. Bilateral nasal bone fractures without adjacent swelling which favor remote deformity. 4. Opacification of the left sphenoid sinus and lateral recess with pneumatized secretions may reflect acute on chronic sinusitis. Electronically Signed   By: Lovena Le M.D.   On: 04/29/2019 03:45    Assessment/Plan There are no diagnoses linked to this encounter.   Family/ staff Communication:   Labs/tests ordered:

## 2020-12-17 DIAGNOSIS — L821 Other seborrheic keratosis: Secondary | ICD-10-CM | POA: Diagnosis not present

## 2020-12-17 DIAGNOSIS — L570 Actinic keratosis: Secondary | ICD-10-CM | POA: Diagnosis not present

## 2020-12-17 DIAGNOSIS — D485 Neoplasm of uncertain behavior of skin: Secondary | ICD-10-CM | POA: Diagnosis not present

## 2020-12-17 DIAGNOSIS — D1801 Hemangioma of skin and subcutaneous tissue: Secondary | ICD-10-CM | POA: Diagnosis not present

## 2020-12-17 DIAGNOSIS — B078 Other viral warts: Secondary | ICD-10-CM | POA: Diagnosis not present

## 2020-12-17 DIAGNOSIS — L814 Other melanin hyperpigmentation: Secondary | ICD-10-CM | POA: Diagnosis not present

## 2020-12-17 DIAGNOSIS — Z85828 Personal history of other malignant neoplasm of skin: Secondary | ICD-10-CM | POA: Diagnosis not present

## 2020-12-19 DIAGNOSIS — F320 Major depressive disorder, single episode, mild: Secondary | ICD-10-CM | POA: Diagnosis not present

## 2020-12-19 DIAGNOSIS — I69311 Memory deficit following cerebral infarction: Secondary | ICD-10-CM | POA: Diagnosis not present

## 2021-01-19 DIAGNOSIS — H2512 Age-related nuclear cataract, left eye: Secondary | ICD-10-CM | POA: Diagnosis not present

## 2021-01-19 DIAGNOSIS — H26491 Other secondary cataract, right eye: Secondary | ICD-10-CM | POA: Diagnosis not present

## 2021-01-19 DIAGNOSIS — H353112 Nonexudative age-related macular degeneration, right eye, intermediate dry stage: Secondary | ICD-10-CM | POA: Diagnosis not present

## 2021-01-19 DIAGNOSIS — H401112 Primary open-angle glaucoma, right eye, moderate stage: Secondary | ICD-10-CM | POA: Diagnosis not present

## 2021-01-21 ENCOUNTER — Ambulatory Visit: Payer: Medicare Other | Admitting: Podiatry

## 2021-01-28 ENCOUNTER — Other Ambulatory Visit: Payer: Self-pay

## 2021-01-28 ENCOUNTER — Emergency Department (HOSPITAL_COMMUNITY)
Admission: EM | Admit: 2021-01-28 | Discharge: 2021-01-29 | Disposition: A | Discharge status: Home / Self Care | Payer: Medicare Other | Attending: Emergency Medicine | Admitting: Emergency Medicine

## 2021-01-28 DIAGNOSIS — M542 Cervicalgia: Secondary | ICD-10-CM | POA: Diagnosis not present

## 2021-01-28 DIAGNOSIS — M4312 Spondylolisthesis, cervical region: Secondary | ICD-10-CM | POA: Diagnosis not present

## 2021-01-28 DIAGNOSIS — Z23 Encounter for immunization: Secondary | ICD-10-CM | POA: Diagnosis not present

## 2021-01-28 DIAGNOSIS — Y9301 Activity, walking, marching and hiking: Secondary | ICD-10-CM | POA: Insufficient documentation

## 2021-01-28 DIAGNOSIS — Z743 Need for continuous supervision: Secondary | ICD-10-CM | POA: Diagnosis not present

## 2021-01-28 DIAGNOSIS — W01198A Fall on same level from slipping, tripping and stumbling with subsequent striking against other object, initial encounter: Secondary | ICD-10-CM | POA: Diagnosis not present

## 2021-01-28 DIAGNOSIS — W19XXXA Unspecified fall, initial encounter: Secondary | ICD-10-CM

## 2021-01-28 DIAGNOSIS — R519 Headache, unspecified: Secondary | ICD-10-CM | POA: Diagnosis not present

## 2021-01-28 DIAGNOSIS — Z7902 Long term (current) use of antithrombotics/antiplatelets: Secondary | ICD-10-CM | POA: Insufficient documentation

## 2021-01-28 DIAGNOSIS — S0101XA Laceration without foreign body of scalp, initial encounter: Secondary | ICD-10-CM | POA: Diagnosis not present

## 2021-01-28 DIAGNOSIS — I10 Essential (primary) hypertension: Secondary | ICD-10-CM | POA: Diagnosis not present

## 2021-01-28 DIAGNOSIS — S0990XA Unspecified injury of head, initial encounter: Secondary | ICD-10-CM | POA: Diagnosis not present

## 2021-01-28 DIAGNOSIS — S0003XA Contusion of scalp, initial encounter: Secondary | ICD-10-CM | POA: Insufficient documentation

## 2021-01-28 NOTE — ED Triage Notes (Signed)
Level 2 Fall on Blood Thinner.   Pt from [facility] assisted living. Normally ambulated independently with a walker. Pt was ambulating today without a walker. States having a mechanical fall. Denies dizziness/syncopal episode that causes the fall. Denies LOC following the fall. Pt fell backwards hitting the back of her head. Presents to ed with small skin tear. Bleeding controlled. Pt alert and oriented x 4 on arrival.

## 2021-01-29 ENCOUNTER — Emergency Department (HOSPITAL_COMMUNITY): Payer: Medicare Other

## 2021-01-29 DIAGNOSIS — M542 Cervicalgia: Secondary | ICD-10-CM | POA: Diagnosis not present

## 2021-01-29 DIAGNOSIS — I69311 Memory deficit following cerebral infarction: Secondary | ICD-10-CM | POA: Diagnosis not present

## 2021-01-29 DIAGNOSIS — S0003XA Contusion of scalp, initial encounter: Secondary | ICD-10-CM | POA: Diagnosis not present

## 2021-01-29 DIAGNOSIS — F320 Major depressive disorder, single episode, mild: Secondary | ICD-10-CM | POA: Diagnosis not present

## 2021-01-29 DIAGNOSIS — R519 Headache, unspecified: Secondary | ICD-10-CM | POA: Diagnosis not present

## 2021-01-29 DIAGNOSIS — M4312 Spondylolisthesis, cervical region: Secondary | ICD-10-CM | POA: Diagnosis not present

## 2021-01-29 LAB — PROTIME-INR
INR: 1 (ref 0.8–1.2)
Prothrombin Time: 13.1 seconds (ref 11.4–15.2)

## 2021-01-29 LAB — CBC
HCT: 36.2 % (ref 36.0–46.0)
Hemoglobin: 11.6 g/dL — ABNORMAL LOW (ref 12.0–15.0)
MCH: 30.6 pg (ref 26.0–34.0)
MCHC: 32 g/dL (ref 30.0–36.0)
MCV: 95.5 fL (ref 80.0–100.0)
Platelets: 116 10*3/uL — ABNORMAL LOW (ref 150–400)
RBC: 3.79 MIL/uL — ABNORMAL LOW (ref 3.87–5.11)
RDW: 14.9 % (ref 11.5–15.5)
WBC: 4.2 10*3/uL (ref 4.0–10.5)
nRBC: 0 % (ref 0.0–0.2)

## 2021-01-29 LAB — COMPREHENSIVE METABOLIC PANEL
ALT: 12 U/L (ref 0–44)
AST: 21 U/L (ref 15–41)
Albumin: 3.5 g/dL (ref 3.5–5.0)
Alkaline Phosphatase: 76 U/L (ref 38–126)
Anion gap: 9 (ref 5–15)
BUN: 22 mg/dL (ref 8–23)
CO2: 22 mmol/L (ref 22–32)
Calcium: 8.7 mg/dL — ABNORMAL LOW (ref 8.9–10.3)
Chloride: 103 mmol/L (ref 98–111)
Creatinine, Ser: 0.94 mg/dL (ref 0.44–1.00)
GFR, Estimated: 58 mL/min — ABNORMAL LOW (ref >60)
Glucose, Bld: 100 mg/dL — ABNORMAL HIGH (ref 70–99)
Potassium: 4 mmol/L (ref 3.5–5.1)
Sodium: 134 mmol/L — ABNORMAL LOW (ref 135–145)
Total Bilirubin: <0.1 mg/dL — ABNORMAL LOW (ref 0.3–1.2)
Total Protein: 6.6 g/dL (ref 6.5–8.1)

## 2021-01-29 MED ORDER — TETANUS-DIPHTH-ACELL PERTUSSIS 5-2.5-18.5 LF-MCG/0.5 IM SUSY
0.5000 mL | PREFILLED_SYRINGE | Freq: Once | INTRAMUSCULAR | Status: AC
  Administered 2021-01-29: 0.5 mL via INTRAMUSCULAR
  Filled 2021-01-29: qty 0.5

## 2021-01-29 NOTE — ED Notes (Signed)
Discharge instructions discussed with pt. Pt verbalized understanding with no questions at this time. Pt wheelchair to lobby pending pick up from daughter back to home.

## 2021-01-29 NOTE — ED Notes (Signed)
No Xray per EDP [place]

## 2021-01-29 NOTE — ED Notes (Signed)
Pt transport to CT by this RN

## 2021-01-29 NOTE — ED Provider Notes (Signed)
[facility] EMERGENCY DEPARTMENT Provider Note   CSN: [id number] Arrival date & time: 01/28/21  2350     History  Chief Complaint  Patient presents with   Lytle Michaels    Deborah Tucker is a 88 y.o. female.  88 year old female that was brought in as a level 2 trauma secondary to fall with head injury on Plavix.  Patient states that she supposed use a walker instead of using walker she decided to get up and walk around without it.  She states that she lost her balance fell backwards hit her head.  She did not pass out.  She does not have a severe headache.  She has no neurologic changes.  She does not hurt anywhere else.  She states that she was able to walk to the stretcher and multiple other times since the incident.  No significant history of falls in the past.  No current complaints besides mild headache associated contusion on the back of her head   Fall      Home Medications Prior to Admission medications   Medication Sig Start Date End Date Taking? Authorizing Provider  Ascorbic Acid (VITAMIN C) 100 MG tablet Take 100 mg by mouth daily.    [provider]  clopidogrel (PLAVIX) 75 MG tablet Take 75 mg by mouth daily.    [provider]  EPINEPHrine 0.3 mg/0.3 mL IJ SOAJ injection Inject 0.3 mg into the muscle as needed for anaphylaxis.    [provider]  escitalopram (LEXAPRO) 5 MG tablet Take 5 mg by mouth daily.    [provider]  estrogens, conjugated, (PREMARIN) 0.3 MG tablet Take 0.3 mg by mouth daily. Take daily for 21 days then do not take for 7 days.    [provider]  Evolocumab 140 MG/ML SOAJ Inject into the skin. Once A Day Every 14 Days    [provider]  ezetimibe (ZETIA) 10 MG tablet Take 10 mg by mouth daily.    [provider]  furosemide (LASIX) 20 MG tablet Take 20 mg by mouth. Once A Morning on Tue, Fri    [provider]  galantamine (RAZADYNE) 8 MG tablet Take 16 mg by  mouth daily.    [provider]  hydrALAZINE (APRESOLINE) 25 MG tablet Take 25 mg by mouth 2 (two) times daily.    [provider]  latanoprost (XALATAN) 0.005 % ophthalmic solution Place 1 drop into both eyes daily. 05/26/15   [provider]  levothyroxine (SYNTHROID) 50 MCG tablet Take 50 mcg by mouth. Once A Day on Sun, Mon, Tue, Wed, Thu, Fri    [provider]  levothyroxine (SYNTHROID, LEVOTHROID) 50 MCG tablet Take 2 tablets by mouth. On Saturday    [provider]  memantine (NAMENDA) 10 MG tablet Take 10 mg by mouth 2 (two) times daily.    [provider]  metoprolol succinate (TOPROL-XL) 50 MG 24 hr tablet Take 25 mg by mouth 2 (two) times daily.    [provider]  Multiple Vitamins-Minerals (ICAPS AREDS 2) CAPS Take 1 capsule by mouth 2 (two) times daily.    [provider]  olmesartan (BENICAR) 40 MG tablet Take 40 mg by mouth daily.    [provider]  pantoprazole (PROTONIX) 40 MG tablet Take 40 mg by mouth daily.    [provider]  Probiotic Product ([place]) CAPS Take 1 capsule by mouth daily.    [provider]  timolol (TIMOPTIC)  0.5 % ophthalmic solution Place 1 drop into both eyes 2 (two) times daily. 06/09/15   [provider]  vitamin B-12 (CYANOCOBALAMIN) 100 MCG tablet Take 100 mcg by mouth daily.    [provider]  zinc oxide 20 % ointment Apply 1 application topically as needed for irritation.    [provider]      Allergies    Penicillins and Penicillins    Review of Systems   Review of Systems  Physical Exam Updated Vital Signs BP (!) 155/64    Pulse 73    Temp 97.6 F (36.4 C) (Temporal)    Resp 16    Ht 5' 3.5" (1.613 m)    Wt 63.5 kg    SpO2 95%    BMI 24.41 kg/m  Physical Exam Vitals and nursing note reviewed.  Constitutional:      Appearance: She is well-developed.  HENT:     Head: Normocephalic.     Comments:  Contusion with a couple small skin tears and abrasions on the back of her head is mildly oozing blood.    Nose: Nose normal. No congestion or rhinorrhea.     Mouth/Throat:     Mouth: Mucous membranes are moist.     Pharynx: Oropharynx is clear.  Eyes:     Pupils: Pupils are equal, round, and reactive to light.  Cardiovascular:     Rate and Rhythm: Normal rate and regular rhythm.  Pulmonary:     Effort: No respiratory distress.     Breath sounds: No stridor.  Abdominal:     General: There is no distension.  Musculoskeletal:        General: No swelling or tenderness. Normal range of motion.     Cervical back: Normal range of motion.     Comments: No cervical spine tenderness, thoracic spine tenderness or Lumbar spine tenderness.  No tenderness or pain with palpation and full ROM of all joints in upper and lower extremities.  No ecchymosis or other signs of trauma on back or extremities.  No Pain with AP or lateral compression of ribs.  No Paracervical ttp, paraspinal ttp   Skin:    General: Skin is warm and dry.  Neurological:     General: No focal deficit present.     Mental Status: She is alert.    ED Results / Procedures / Treatments   Labs (all labs ordered are listed, but only abnormal results are displayed) Labs Reviewed  COMPREHENSIVE METABOLIC PANEL - Abnormal; Notable for the following components:      Result Value   Sodium 134 (*)    Glucose, Bld 100 (*)    Calcium 8.7 (*)    Total Bilirubin <0.1 (*)    GFR, Estimated 58 (*)    All other components within normal limits  CBC - Abnormal; Notable for the following components:   RBC 3.79 (*)    Hemoglobin 11.6 (*)    Platelets 116 (*)    All other components within normal limits  PROTIME-INR    EKG None  Radiology CT HEAD WO CONTRAST  Result Date: 01/29/2021 CLINICAL DATA:  Recent fall with headaches and neck pain, initial encounter EXAM: CT HEAD WITHOUT CONTRAST CT CERVICAL SPINE WITHOUT CONTRAST  TECHNIQUE: Multidetector CT imaging of the head and cervical spine was performed following the standard protocol without intravenous contrast. Multiplanar CT image reconstructions of the cervical spine were also generated. RADIATION DOSE REDUCTION: This exam was performed according to the departmental dose-optimization  program which includes automated exposure control, adjustment of the mA and/or kV according to patient size and/or use of iterative reconstruction technique. COMPARISON:  None. FINDINGS: CT HEAD FINDINGS Brain: No evidence of acute infarction, hemorrhage, hydrocephalus, extra-axial collection or mass lesion/mass effect. Chronic atrophic and ischemic changes are noted. Vascular: No hyperdense vessel or unexpected calcification. Skull: Normal. Negative for fracture or focal lesion. Sinuses/Orbits: No acute finding. Other: None. CT CERVICAL SPINE FINDINGS Alignment: Within normal limits with the exception of mild degenerative anterolisthesis of C4 on C5. Skull base and vertebrae: 7 cervical segments are well visualized. Multilevel facet hypertrophic changes are seen. Mild osteophytic changes are noted. Degenerative anterolisthesis of C4 on C5 is seen. Acute fracture acute facet abnormality is noted. The odontoid is within normal limits. Soft tissues and spinal canal: Surrounding soft tissue structures are within normal limits. Upper chest: Visualized lung apices are unremarkable. Other: None IMPRESSION: CT of the head: No acute intracranial abnormality noted. Chronic atrophic and ischemic changes. CT of the cervical spine: Multilevel degenerative change with mild degenerative anterolisthesis of C4 on C5. No acute bony abnormality is noted. Electronically Signed   By: Inez Catalina M.D.   On: 01/29/2021 00:24   CT CERVICAL SPINE WO CONTRAST  Result Date: 01/29/2021 CLINICAL DATA:  Recent fall with headaches and neck pain, initial encounter EXAM: CT HEAD WITHOUT CONTRAST CT CERVICAL SPINE WITHOUT  CONTRAST TECHNIQUE: Multidetector CT imaging of the head and cervical spine was performed following the standard protocol without intravenous contrast. Multiplanar CT image reconstructions of the cervical spine were also generated. RADIATION DOSE REDUCTION: This exam was performed according to the departmental dose-optimization program which includes automated exposure control, adjustment of the mA and/or kV according to patient size and/or use of iterative reconstruction technique. COMPARISON:  None. FINDINGS: CT HEAD FINDINGS Brain: No evidence of acute infarction, hemorrhage, hydrocephalus, extra-axial collection or mass lesion/mass effect. Chronic atrophic and ischemic changes are noted. Vascular: No hyperdense vessel or unexpected calcification. Skull: Normal. Negative for fracture or focal lesion. Sinuses/Orbits: No acute finding. Other: None. CT CERVICAL SPINE FINDINGS Alignment: Within normal limits with the exception of mild degenerative anterolisthesis of C4 on C5. Skull base and vertebrae: 7 cervical segments are well visualized. Multilevel facet hypertrophic changes are seen. Mild osteophytic changes are noted. Degenerative anterolisthesis of C4 on C5 is seen. Acute fracture acute facet abnormality is noted. The odontoid is within normal limits. Soft tissues and spinal canal: Surrounding soft tissue structures are within normal limits. Upper chest: Visualized lung apices are unremarkable. Other: None IMPRESSION: CT of the head: No acute intracranial abnormality noted. Chronic atrophic and ischemic changes. CT of the cervical spine: Multilevel degenerative change with mild degenerative anterolisthesis of C4 on C5. No acute bony abnormality is noted. Electronically Signed   By: Inez Catalina M.D.   On: 01/29/2021 00:24    Procedures .Marland KitchenLaceration Repair  Date/Time: 01/29/2021 2:01 AM Performed by: Merrily Pew, MD Authorized by: Merrily Pew, MD   Consent:    Consent obtained:  Verbal   Consent  given by:  Patient   Risks discussed:  Infection, need for additional repair, nerve damage, poor wound healing, poor cosmetic result, pain, retained foreign body, tendon damage and vascular damage   Alternatives discussed:  No treatment, delayed treatment and observation Universal protocol:    Procedure explained and questions answered to patient or proxy's satisfaction: yes     Relevant documents present and verified: yes     Site/side marked: yes  Patient identity confirmed:  Hospital-assigned identification number and arm band Anesthesia:    Anesthesia method:  None Laceration details:    Location:  Scalp   Scalp location:  Crown   Length (cm):  0.4   Depth (mm):  1 Pre-procedure details:    Preparation:  Patient was prepped and draped in usual sterile fashion and imaging obtained to evaluate for foreign bodies Exploration:    Imaging obtained comment:  Ct   Wound exploration: wound explored through full range of motion   Treatment:    Area cleansed with:  Saline   Amount of cleaning:  Standard   Irrigation solution:  Sterile water   Irrigation volume:  50   Irrigation method:  Syringe   Visualized foreign bodies/material removed: no   Skin repair:    Repair method:  Sutures   Suture size:  5-0   Suture material:  Fast-absorbing gut   Suture technique:  Simple interrupted   Number of sutures:  1 Approximation:    Approximation:  Close Repair type:    Repair type:  Simple Post-procedure details:    Dressing:  Non-adherent dressing, bulky dressing and tube gauze   Procedure completion:  Tolerated well, no immediate complications    Medications Ordered in ED Medications  Tdap (BOOSTRIX) injection 0.5 mL (0.5 mLs Intramuscular Given 01/29/21 0153)    ED Course/ Medical Decision Making/ A&P                           Medical Decision Making Amount and/or Complexity of Data Reviewed Labs: ordered. Radiology: ordered.  Risk Prescription drug management.   CT  reviewed by myself and interpreted by radiologist as well that did not show any obvious intracranial injury or bony injury to the head.  Patient is remaining neurologically intact.  1 suture placed to help with hemostasis.  Patient stable for discharge   Final Clinical Impression(s) / ED Diagnoses Final diagnoses:  Fall, initial encounter  Contusion of scalp, initial encounter    Rx / DC Orders ED Discharge Orders     None         Khaled Herda, Corene Cornea, MD 01/29/21 0202

## 2021-01-29 NOTE — Progress Notes (Signed)
Orthopedic Tech Progress Note Patient Details:  [name] 05-Jun-1930 [id number] Level 2 trauma Patient ID: Deborah Tucker, female   DOB: 06-26-1930, 88 y.o.   MRN: [id number]  Deborah Tucker 01/29/2021, 1:51 AM

## 2021-01-29 NOTE — Discharge Instructions (Signed)
I placed 1 absorbable suture and some Powder to help stop the bleeding in the back of your head.  I put the wrap on just to hold his things in place.  You can remove the wrap tomorrow morning.  If that happens to fall off tonight that is okay.

## 2021-02-06 DIAGNOSIS — M419 Scoliosis, unspecified: Secondary | ICD-10-CM | POA: Diagnosis not present

## 2021-02-09 DIAGNOSIS — J301 Allergic rhinitis due to pollen: Secondary | ICD-10-CM | POA: Diagnosis not present

## 2021-02-16 DIAGNOSIS — N390 Urinary tract infection, site not specified: Secondary | ICD-10-CM | POA: Diagnosis not present

## 2021-02-18 DIAGNOSIS — R2681 Unsteadiness on feet: Secondary | ICD-10-CM | POA: Diagnosis not present

## 2021-02-18 DIAGNOSIS — Z9181 History of falling: Secondary | ICD-10-CM | POA: Diagnosis not present

## 2021-02-18 DIAGNOSIS — M6281 Muscle weakness (generalized): Secondary | ICD-10-CM | POA: Diagnosis not present

## 2021-02-18 DIAGNOSIS — R419 Unspecified symptoms and signs involving cognitive functions and awareness: Secondary | ICD-10-CM | POA: Diagnosis not present

## 2021-02-18 DIAGNOSIS — R41841 Cognitive communication deficit: Secondary | ICD-10-CM | POA: Diagnosis not present

## 2021-02-19 DIAGNOSIS — M6281 Muscle weakness (generalized): Secondary | ICD-10-CM | POA: Diagnosis not present

## 2021-02-19 DIAGNOSIS — R419 Unspecified symptoms and signs involving cognitive functions and awareness: Secondary | ICD-10-CM | POA: Diagnosis not present

## 2021-02-19 DIAGNOSIS — Z9181 History of falling: Secondary | ICD-10-CM | POA: Diagnosis not present

## 2021-02-19 DIAGNOSIS — R2681 Unsteadiness on feet: Secondary | ICD-10-CM | POA: Diagnosis not present

## 2021-02-19 DIAGNOSIS — R41841 Cognitive communication deficit: Secondary | ICD-10-CM | POA: Diagnosis not present

## 2021-02-20 DIAGNOSIS — M6281 Muscle weakness (generalized): Secondary | ICD-10-CM | POA: Diagnosis not present

## 2021-02-20 DIAGNOSIS — Z9181 History of falling: Secondary | ICD-10-CM | POA: Diagnosis not present

## 2021-02-20 DIAGNOSIS — R2681 Unsteadiness on feet: Secondary | ICD-10-CM | POA: Diagnosis not present

## 2021-02-20 DIAGNOSIS — I69311 Memory deficit following cerebral infarction: Secondary | ICD-10-CM | POA: Diagnosis not present

## 2021-02-20 DIAGNOSIS — R41841 Cognitive communication deficit: Secondary | ICD-10-CM | POA: Diagnosis not present

## 2021-02-20 DIAGNOSIS — R419 Unspecified symptoms and signs involving cognitive functions and awareness: Secondary | ICD-10-CM | POA: Diagnosis not present

## 2021-02-20 DIAGNOSIS — F320 Major depressive disorder, single episode, mild: Secondary | ICD-10-CM | POA: Diagnosis not present

## 2021-02-22 DIAGNOSIS — Z9181 History of falling: Secondary | ICD-10-CM | POA: Diagnosis not present

## 2021-02-22 DIAGNOSIS — R41841 Cognitive communication deficit: Secondary | ICD-10-CM | POA: Diagnosis not present

## 2021-02-22 DIAGNOSIS — R2681 Unsteadiness on feet: Secondary | ICD-10-CM | POA: Diagnosis not present

## 2021-02-22 DIAGNOSIS — R419 Unspecified symptoms and signs involving cognitive functions and awareness: Secondary | ICD-10-CM | POA: Diagnosis not present

## 2021-02-22 DIAGNOSIS — M6281 Muscle weakness (generalized): Secondary | ICD-10-CM | POA: Diagnosis not present

## 2021-02-23 DIAGNOSIS — R41841 Cognitive communication deficit: Secondary | ICD-10-CM | POA: Diagnosis not present

## 2021-02-23 DIAGNOSIS — R2681 Unsteadiness on feet: Secondary | ICD-10-CM | POA: Diagnosis not present

## 2021-02-23 DIAGNOSIS — Z9181 History of falling: Secondary | ICD-10-CM | POA: Diagnosis not present

## 2021-02-23 DIAGNOSIS — R419 Unspecified symptoms and signs involving cognitive functions and awareness: Secondary | ICD-10-CM | POA: Diagnosis not present

## 2021-02-23 DIAGNOSIS — M6281 Muscle weakness (generalized): Secondary | ICD-10-CM | POA: Diagnosis not present

## 2021-02-24 DIAGNOSIS — Z9181 History of falling: Secondary | ICD-10-CM | POA: Diagnosis not present

## 2021-02-24 DIAGNOSIS — M6281 Muscle weakness (generalized): Secondary | ICD-10-CM | POA: Diagnosis not present

## 2021-02-24 DIAGNOSIS — R419 Unspecified symptoms and signs involving cognitive functions and awareness: Secondary | ICD-10-CM | POA: Diagnosis not present

## 2021-02-24 DIAGNOSIS — R2681 Unsteadiness on feet: Secondary | ICD-10-CM | POA: Diagnosis not present

## 2021-02-24 DIAGNOSIS — R41841 Cognitive communication deficit: Secondary | ICD-10-CM | POA: Diagnosis not present

## 2021-02-25 DIAGNOSIS — R41841 Cognitive communication deficit: Secondary | ICD-10-CM | POA: Diagnosis not present

## 2021-02-25 DIAGNOSIS — R419 Unspecified symptoms and signs involving cognitive functions and awareness: Secondary | ICD-10-CM | POA: Diagnosis not present

## 2021-02-25 DIAGNOSIS — Z9181 History of falling: Secondary | ICD-10-CM | POA: Diagnosis not present

## 2021-02-25 DIAGNOSIS — R2681 Unsteadiness on feet: Secondary | ICD-10-CM | POA: Diagnosis not present

## 2021-02-25 DIAGNOSIS — M6281 Muscle weakness (generalized): Secondary | ICD-10-CM | POA: Diagnosis not present

## 2021-02-26 DIAGNOSIS — M6281 Muscle weakness (generalized): Secondary | ICD-10-CM | POA: Diagnosis not present

## 2021-02-26 DIAGNOSIS — R41841 Cognitive communication deficit: Secondary | ICD-10-CM | POA: Diagnosis not present

## 2021-02-26 DIAGNOSIS — R419 Unspecified symptoms and signs involving cognitive functions and awareness: Secondary | ICD-10-CM | POA: Diagnosis not present

## 2021-02-26 DIAGNOSIS — R2681 Unsteadiness on feet: Secondary | ICD-10-CM | POA: Diagnosis not present

## 2021-02-26 DIAGNOSIS — Z9181 History of falling: Secondary | ICD-10-CM | POA: Diagnosis not present

## 2021-02-27 DIAGNOSIS — R41841 Cognitive communication deficit: Secondary | ICD-10-CM | POA: Diagnosis not present

## 2021-02-27 DIAGNOSIS — R419 Unspecified symptoms and signs involving cognitive functions and awareness: Secondary | ICD-10-CM | POA: Diagnosis not present

## 2021-02-27 DIAGNOSIS — Z9181 History of falling: Secondary | ICD-10-CM | POA: Diagnosis not present

## 2021-02-27 DIAGNOSIS — M6281 Muscle weakness (generalized): Secondary | ICD-10-CM | POA: Diagnosis not present

## 2021-02-27 DIAGNOSIS — R2681 Unsteadiness on feet: Secondary | ICD-10-CM | POA: Diagnosis not present

## 2021-03-02 DIAGNOSIS — M6281 Muscle weakness (generalized): Secondary | ICD-10-CM | POA: Diagnosis not present

## 2021-03-02 DIAGNOSIS — R2681 Unsteadiness on feet: Secondary | ICD-10-CM | POA: Diagnosis not present

## 2021-03-02 DIAGNOSIS — R419 Unspecified symptoms and signs involving cognitive functions and awareness: Secondary | ICD-10-CM | POA: Diagnosis not present

## 2021-03-02 DIAGNOSIS — Z9181 History of falling: Secondary | ICD-10-CM | POA: Diagnosis not present

## 2021-03-02 DIAGNOSIS — R41841 Cognitive communication deficit: Secondary | ICD-10-CM | POA: Diagnosis not present

## 2021-03-03 DIAGNOSIS — R41841 Cognitive communication deficit: Secondary | ICD-10-CM | POA: Diagnosis not present

## 2021-03-03 DIAGNOSIS — R419 Unspecified symptoms and signs involving cognitive functions and awareness: Secondary | ICD-10-CM | POA: Diagnosis not present

## 2021-03-03 DIAGNOSIS — R2681 Unsteadiness on feet: Secondary | ICD-10-CM | POA: Diagnosis not present

## 2021-03-03 DIAGNOSIS — M6281 Muscle weakness (generalized): Secondary | ICD-10-CM | POA: Diagnosis not present

## 2021-03-03 DIAGNOSIS — Z9181 History of falling: Secondary | ICD-10-CM | POA: Diagnosis not present

## 2021-03-04 DIAGNOSIS — Z9181 History of falling: Secondary | ICD-10-CM | POA: Diagnosis not present

## 2021-03-04 DIAGNOSIS — M6281 Muscle weakness (generalized): Secondary | ICD-10-CM | POA: Diagnosis not present

## 2021-03-04 DIAGNOSIS — R419 Unspecified symptoms and signs involving cognitive functions and awareness: Secondary | ICD-10-CM | POA: Diagnosis not present

## 2021-03-04 DIAGNOSIS — R2681 Unsteadiness on feet: Secondary | ICD-10-CM | POA: Diagnosis not present

## 2021-03-04 DIAGNOSIS — R41841 Cognitive communication deficit: Secondary | ICD-10-CM | POA: Diagnosis not present

## 2021-03-05 DIAGNOSIS — R2681 Unsteadiness on feet: Secondary | ICD-10-CM | POA: Diagnosis not present

## 2021-03-05 DIAGNOSIS — Z9181 History of falling: Secondary | ICD-10-CM | POA: Diagnosis not present

## 2021-03-05 DIAGNOSIS — R419 Unspecified symptoms and signs involving cognitive functions and awareness: Secondary | ICD-10-CM | POA: Diagnosis not present

## 2021-03-05 DIAGNOSIS — M6281 Muscle weakness (generalized): Secondary | ICD-10-CM | POA: Diagnosis not present

## 2021-03-05 DIAGNOSIS — R41841 Cognitive communication deficit: Secondary | ICD-10-CM | POA: Diagnosis not present

## 2021-03-09 DIAGNOSIS — R2681 Unsteadiness on feet: Secondary | ICD-10-CM | POA: Diagnosis not present

## 2021-03-09 DIAGNOSIS — Z20822 Contact with and (suspected) exposure to covid-19: Secondary | ICD-10-CM | POA: Diagnosis not present

## 2021-03-09 DIAGNOSIS — R41841 Cognitive communication deficit: Secondary | ICD-10-CM | POA: Diagnosis not present

## 2021-03-09 DIAGNOSIS — Z9181 History of falling: Secondary | ICD-10-CM | POA: Diagnosis not present

## 2021-03-09 DIAGNOSIS — R419 Unspecified symptoms and signs involving cognitive functions and awareness: Secondary | ICD-10-CM | POA: Diagnosis not present

## 2021-03-09 DIAGNOSIS — M6281 Muscle weakness (generalized): Secondary | ICD-10-CM | POA: Diagnosis not present

## 2021-03-10 DIAGNOSIS — Z9181 History of falling: Secondary | ICD-10-CM | POA: Diagnosis not present

## 2021-03-10 DIAGNOSIS — R419 Unspecified symptoms and signs involving cognitive functions and awareness: Secondary | ICD-10-CM | POA: Diagnosis not present

## 2021-03-10 DIAGNOSIS — R41841 Cognitive communication deficit: Secondary | ICD-10-CM | POA: Diagnosis not present

## 2021-03-10 DIAGNOSIS — M6281 Muscle weakness (generalized): Secondary | ICD-10-CM | POA: Diagnosis not present

## 2021-03-10 DIAGNOSIS — R2681 Unsteadiness on feet: Secondary | ICD-10-CM | POA: Diagnosis not present

## 2021-03-11 DIAGNOSIS — R2681 Unsteadiness on feet: Secondary | ICD-10-CM | POA: Diagnosis not present

## 2021-03-11 DIAGNOSIS — Z9181 History of falling: Secondary | ICD-10-CM | POA: Diagnosis not present

## 2021-03-11 DIAGNOSIS — R41841 Cognitive communication deficit: Secondary | ICD-10-CM | POA: Diagnosis not present

## 2021-03-11 DIAGNOSIS — R419 Unspecified symptoms and signs involving cognitive functions and awareness: Secondary | ICD-10-CM | POA: Diagnosis not present

## 2021-03-11 DIAGNOSIS — M6281 Muscle weakness (generalized): Secondary | ICD-10-CM | POA: Diagnosis not present

## 2021-03-12 DIAGNOSIS — R419 Unspecified symptoms and signs involving cognitive functions and awareness: Secondary | ICD-10-CM | POA: Diagnosis not present

## 2021-03-12 DIAGNOSIS — R2681 Unsteadiness on feet: Secondary | ICD-10-CM | POA: Diagnosis not present

## 2021-03-12 DIAGNOSIS — M6281 Muscle weakness (generalized): Secondary | ICD-10-CM | POA: Diagnosis not present

## 2021-03-12 DIAGNOSIS — Z9181 History of falling: Secondary | ICD-10-CM | POA: Diagnosis not present

## 2021-03-12 DIAGNOSIS — R41841 Cognitive communication deficit: Secondary | ICD-10-CM | POA: Diagnosis not present

## 2021-03-13 DIAGNOSIS — R419 Unspecified symptoms and signs involving cognitive functions and awareness: Secondary | ICD-10-CM | POA: Diagnosis not present

## 2021-03-13 DIAGNOSIS — M6281 Muscle weakness (generalized): Secondary | ICD-10-CM | POA: Diagnosis not present

## 2021-03-13 DIAGNOSIS — R41841 Cognitive communication deficit: Secondary | ICD-10-CM | POA: Diagnosis not present

## 2021-03-13 DIAGNOSIS — Z9181 History of falling: Secondary | ICD-10-CM | POA: Diagnosis not present

## 2021-03-13 DIAGNOSIS — R2681 Unsteadiness on feet: Secondary | ICD-10-CM | POA: Diagnosis not present

## 2021-03-16 DIAGNOSIS — R2681 Unsteadiness on feet: Secondary | ICD-10-CM | POA: Diagnosis not present

## 2021-03-16 DIAGNOSIS — Z9181 History of falling: Secondary | ICD-10-CM | POA: Diagnosis not present

## 2021-03-16 DIAGNOSIS — M6281 Muscle weakness (generalized): Secondary | ICD-10-CM | POA: Diagnosis not present

## 2021-03-16 DIAGNOSIS — R41841 Cognitive communication deficit: Secondary | ICD-10-CM | POA: Diagnosis not present

## 2021-03-16 DIAGNOSIS — R419 Unspecified symptoms and signs involving cognitive functions and awareness: Secondary | ICD-10-CM | POA: Diagnosis not present

## 2021-03-17 DIAGNOSIS — Z9181 History of falling: Secondary | ICD-10-CM | POA: Diagnosis not present

## 2021-03-17 DIAGNOSIS — I69311 Memory deficit following cerebral infarction: Secondary | ICD-10-CM | POA: Diagnosis not present

## 2021-03-17 DIAGNOSIS — R41841 Cognitive communication deficit: Secondary | ICD-10-CM | POA: Diagnosis not present

## 2021-03-17 DIAGNOSIS — R419 Unspecified symptoms and signs involving cognitive functions and awareness: Secondary | ICD-10-CM | POA: Diagnosis not present

## 2021-03-17 DIAGNOSIS — M6281 Muscle weakness (generalized): Secondary | ICD-10-CM | POA: Diagnosis not present

## 2021-03-17 DIAGNOSIS — R2681 Unsteadiness on feet: Secondary | ICD-10-CM | POA: Diagnosis not present

## 2021-03-17 DIAGNOSIS — F320 Major depressive disorder, single episode, mild: Secondary | ICD-10-CM | POA: Diagnosis not present

## 2021-03-18 DIAGNOSIS — R2681 Unsteadiness on feet: Secondary | ICD-10-CM | POA: Diagnosis not present

## 2021-03-18 DIAGNOSIS — Z9181 History of falling: Secondary | ICD-10-CM | POA: Diagnosis not present

## 2021-03-18 DIAGNOSIS — R41841 Cognitive communication deficit: Secondary | ICD-10-CM | POA: Diagnosis not present

## 2021-03-18 DIAGNOSIS — M6281 Muscle weakness (generalized): Secondary | ICD-10-CM | POA: Diagnosis not present

## 2021-03-18 DIAGNOSIS — R419 Unspecified symptoms and signs involving cognitive functions and awareness: Secondary | ICD-10-CM | POA: Diagnosis not present

## 2021-03-19 DIAGNOSIS — R419 Unspecified symptoms and signs involving cognitive functions and awareness: Secondary | ICD-10-CM | POA: Diagnosis not present

## 2021-03-19 DIAGNOSIS — R2681 Unsteadiness on feet: Secondary | ICD-10-CM | POA: Diagnosis not present

## 2021-03-19 DIAGNOSIS — M6281 Muscle weakness (generalized): Secondary | ICD-10-CM | POA: Diagnosis not present

## 2021-03-19 DIAGNOSIS — Z9181 History of falling: Secondary | ICD-10-CM | POA: Diagnosis not present

## 2021-03-19 DIAGNOSIS — R41841 Cognitive communication deficit: Secondary | ICD-10-CM | POA: Diagnosis not present

## 2021-03-20 DIAGNOSIS — R2681 Unsteadiness on feet: Secondary | ICD-10-CM | POA: Diagnosis not present

## 2021-03-20 DIAGNOSIS — R419 Unspecified symptoms and signs involving cognitive functions and awareness: Secondary | ICD-10-CM | POA: Diagnosis not present

## 2021-03-20 DIAGNOSIS — M6281 Muscle weakness (generalized): Secondary | ICD-10-CM | POA: Diagnosis not present

## 2021-03-20 DIAGNOSIS — Z9181 History of falling: Secondary | ICD-10-CM | POA: Diagnosis not present

## 2021-03-20 DIAGNOSIS — R41841 Cognitive communication deficit: Secondary | ICD-10-CM | POA: Diagnosis not present

## 2021-03-23 DIAGNOSIS — Z9181 History of falling: Secondary | ICD-10-CM | POA: Diagnosis not present

## 2021-03-23 DIAGNOSIS — R2681 Unsteadiness on feet: Secondary | ICD-10-CM | POA: Diagnosis not present

## 2021-03-23 DIAGNOSIS — M6281 Muscle weakness (generalized): Secondary | ICD-10-CM | POA: Diagnosis not present

## 2021-03-23 DIAGNOSIS — R41841 Cognitive communication deficit: Secondary | ICD-10-CM | POA: Diagnosis not present

## 2021-03-23 DIAGNOSIS — R419 Unspecified symptoms and signs involving cognitive functions and awareness: Secondary | ICD-10-CM | POA: Diagnosis not present

## 2021-03-24 DIAGNOSIS — R41841 Cognitive communication deficit: Secondary | ICD-10-CM | POA: Diagnosis not present

## 2021-03-24 DIAGNOSIS — M6281 Muscle weakness (generalized): Secondary | ICD-10-CM | POA: Diagnosis not present

## 2021-03-24 DIAGNOSIS — R419 Unspecified symptoms and signs involving cognitive functions and awareness: Secondary | ICD-10-CM | POA: Diagnosis not present

## 2021-03-24 DIAGNOSIS — Z9181 History of falling: Secondary | ICD-10-CM | POA: Diagnosis not present

## 2021-03-24 DIAGNOSIS — R2681 Unsteadiness on feet: Secondary | ICD-10-CM | POA: Diagnosis not present

## 2021-03-25 DIAGNOSIS — M6281 Muscle weakness (generalized): Secondary | ICD-10-CM | POA: Diagnosis not present

## 2021-03-25 DIAGNOSIS — R2681 Unsteadiness on feet: Secondary | ICD-10-CM | POA: Diagnosis not present

## 2021-03-25 DIAGNOSIS — Z9181 History of falling: Secondary | ICD-10-CM | POA: Diagnosis not present

## 2021-03-25 DIAGNOSIS — R41841 Cognitive communication deficit: Secondary | ICD-10-CM | POA: Diagnosis not present

## 2021-03-25 DIAGNOSIS — R419 Unspecified symptoms and signs involving cognitive functions and awareness: Secondary | ICD-10-CM | POA: Diagnosis not present

## 2021-03-27 DIAGNOSIS — R419 Unspecified symptoms and signs involving cognitive functions and awareness: Secondary | ICD-10-CM | POA: Diagnosis not present

## 2021-03-27 DIAGNOSIS — R2681 Unsteadiness on feet: Secondary | ICD-10-CM | POA: Diagnosis not present

## 2021-03-27 DIAGNOSIS — Z9181 History of falling: Secondary | ICD-10-CM | POA: Diagnosis not present

## 2021-03-27 DIAGNOSIS — R41841 Cognitive communication deficit: Secondary | ICD-10-CM | POA: Diagnosis not present

## 2021-03-27 DIAGNOSIS — M6281 Muscle weakness (generalized): Secondary | ICD-10-CM | POA: Diagnosis not present

## 2021-03-31 DIAGNOSIS — R2681 Unsteadiness on feet: Secondary | ICD-10-CM | POA: Diagnosis not present

## 2021-03-31 DIAGNOSIS — R41841 Cognitive communication deficit: Secondary | ICD-10-CM | POA: Diagnosis not present

## 2021-03-31 DIAGNOSIS — R419 Unspecified symptoms and signs involving cognitive functions and awareness: Secondary | ICD-10-CM | POA: Diagnosis not present

## 2021-03-31 DIAGNOSIS — Z9181 History of falling: Secondary | ICD-10-CM | POA: Diagnosis not present

## 2021-03-31 DIAGNOSIS — M6281 Muscle weakness (generalized): Secondary | ICD-10-CM | POA: Diagnosis not present

## 2021-04-01 DIAGNOSIS — R41841 Cognitive communication deficit: Secondary | ICD-10-CM | POA: Diagnosis not present

## 2021-04-01 DIAGNOSIS — R419 Unspecified symptoms and signs involving cognitive functions and awareness: Secondary | ICD-10-CM | POA: Diagnosis not present

## 2021-04-01 DIAGNOSIS — M6281 Muscle weakness (generalized): Secondary | ICD-10-CM | POA: Diagnosis not present

## 2021-04-01 DIAGNOSIS — R2681 Unsteadiness on feet: Secondary | ICD-10-CM | POA: Diagnosis not present

## 2021-04-01 DIAGNOSIS — Z9181 History of falling: Secondary | ICD-10-CM | POA: Diagnosis not present

## 2021-04-03 DIAGNOSIS — R41841 Cognitive communication deficit: Secondary | ICD-10-CM | POA: Diagnosis not present

## 2021-04-03 DIAGNOSIS — M6281 Muscle weakness (generalized): Secondary | ICD-10-CM | POA: Diagnosis not present

## 2021-04-03 DIAGNOSIS — Z9181 History of falling: Secondary | ICD-10-CM | POA: Diagnosis not present

## 2021-04-03 DIAGNOSIS — R2681 Unsteadiness on feet: Secondary | ICD-10-CM | POA: Diagnosis not present

## 2021-04-03 DIAGNOSIS — R419 Unspecified symptoms and signs involving cognitive functions and awareness: Secondary | ICD-10-CM | POA: Diagnosis not present

## 2021-04-07 DIAGNOSIS — M6281 Muscle weakness (generalized): Secondary | ICD-10-CM | POA: Diagnosis not present

## 2021-04-07 DIAGNOSIS — R41841 Cognitive communication deficit: Secondary | ICD-10-CM | POA: Diagnosis not present

## 2021-04-07 DIAGNOSIS — R419 Unspecified symptoms and signs involving cognitive functions and awareness: Secondary | ICD-10-CM | POA: Diagnosis not present

## 2021-04-07 DIAGNOSIS — R2681 Unsteadiness on feet: Secondary | ICD-10-CM | POA: Diagnosis not present

## 2021-04-07 DIAGNOSIS — Z9181 History of falling: Secondary | ICD-10-CM | POA: Diagnosis not present

## 2021-04-08 DIAGNOSIS — Z9181 History of falling: Secondary | ICD-10-CM | POA: Diagnosis not present

## 2021-04-08 DIAGNOSIS — R41841 Cognitive communication deficit: Secondary | ICD-10-CM | POA: Diagnosis not present

## 2021-04-08 DIAGNOSIS — R2681 Unsteadiness on feet: Secondary | ICD-10-CM | POA: Diagnosis not present

## 2021-04-08 DIAGNOSIS — M6281 Muscle weakness (generalized): Secondary | ICD-10-CM | POA: Diagnosis not present

## 2021-04-08 DIAGNOSIS — R419 Unspecified symptoms and signs involving cognitive functions and awareness: Secondary | ICD-10-CM | POA: Diagnosis not present

## 2021-04-09 DIAGNOSIS — R419 Unspecified symptoms and signs involving cognitive functions and awareness: Secondary | ICD-10-CM | POA: Diagnosis not present

## 2021-04-09 DIAGNOSIS — Z9181 History of falling: Secondary | ICD-10-CM | POA: Diagnosis not present

## 2021-04-09 DIAGNOSIS — R2681 Unsteadiness on feet: Secondary | ICD-10-CM | POA: Diagnosis not present

## 2021-04-09 DIAGNOSIS — R41841 Cognitive communication deficit: Secondary | ICD-10-CM | POA: Diagnosis not present

## 2021-04-09 DIAGNOSIS — M6281 Muscle weakness (generalized): Secondary | ICD-10-CM | POA: Diagnosis not present

## 2021-04-10 DIAGNOSIS — R419 Unspecified symptoms and signs involving cognitive functions and awareness: Secondary | ICD-10-CM | POA: Diagnosis not present

## 2021-04-10 DIAGNOSIS — R2681 Unsteadiness on feet: Secondary | ICD-10-CM | POA: Diagnosis not present

## 2021-04-10 DIAGNOSIS — R41841 Cognitive communication deficit: Secondary | ICD-10-CM | POA: Diagnosis not present

## 2021-04-10 DIAGNOSIS — M6281 Muscle weakness (generalized): Secondary | ICD-10-CM | POA: Diagnosis not present

## 2021-04-10 DIAGNOSIS — Z9181 History of falling: Secondary | ICD-10-CM | POA: Diagnosis not present

## 2021-05-04 DIAGNOSIS — E039 Hypothyroidism, unspecified: Secondary | ICD-10-CM | POA: Diagnosis not present

## 2021-05-04 DIAGNOSIS — D519 Vitamin B12 deficiency anemia, unspecified: Secondary | ICD-10-CM | POA: Diagnosis not present

## 2021-05-04 DIAGNOSIS — Z79899 Other long term (current) drug therapy: Secondary | ICD-10-CM | POA: Diagnosis not present

## 2021-05-04 DIAGNOSIS — R7301 Impaired fasting glucose: Secondary | ICD-10-CM | POA: Diagnosis not present

## 2021-05-04 DIAGNOSIS — R5383 Other fatigue: Secondary | ICD-10-CM | POA: Diagnosis not present

## 2021-05-04 DIAGNOSIS — I10 Essential (primary) hypertension: Secondary | ICD-10-CM | POA: Diagnosis not present

## 2021-05-04 DIAGNOSIS — E785 Hyperlipidemia, unspecified: Secondary | ICD-10-CM | POA: Diagnosis not present

## 2021-05-04 DIAGNOSIS — D509 Iron deficiency anemia, unspecified: Secondary | ICD-10-CM | POA: Diagnosis not present

## 2021-05-04 DIAGNOSIS — M8589 Other specified disorders of bone density and structure, multiple sites: Secondary | ICD-10-CM | POA: Diagnosis not present

## 2021-05-08 DIAGNOSIS — I129 Hypertensive chronic kidney disease with stage 1 through stage 4 chronic kidney disease, or unspecified chronic kidney disease: Secondary | ICD-10-CM | POA: Diagnosis not present

## 2021-05-08 DIAGNOSIS — K219 Gastro-esophageal reflux disease without esophagitis: Secondary | ICD-10-CM | POA: Diagnosis not present

## 2021-05-08 DIAGNOSIS — R0789 Other chest pain: Secondary | ICD-10-CM | POA: Diagnosis not present

## 2021-05-08 DIAGNOSIS — J301 Allergic rhinitis due to pollen: Secondary | ICD-10-CM | POA: Diagnosis not present

## 2021-05-08 DIAGNOSIS — I2510 Atherosclerotic heart disease of native coronary artery without angina pectoris: Secondary | ICD-10-CM | POA: Diagnosis not present

## 2021-05-08 DIAGNOSIS — M1990 Unspecified osteoarthritis, unspecified site: Secondary | ICD-10-CM | POA: Diagnosis not present

## 2021-05-12 ENCOUNTER — Emergency Department (HOSPITAL_COMMUNITY): Payer: Medicare Other

## 2021-05-12 ENCOUNTER — Encounter (HOSPITAL_COMMUNITY): Payer: Self-pay | Admitting: Emergency Medicine

## 2021-05-12 ENCOUNTER — Emergency Department (HOSPITAL_COMMUNITY)
Admission: EM | Admit: 2021-05-12 | Discharge: 2021-05-13 | Disposition: A | Discharge status: Home / Self Care | Payer: Medicare Other | Source: Home / Self Care | Attending: Emergency Medicine | Admitting: Emergency Medicine

## 2021-05-12 ENCOUNTER — Other Ambulatory Visit: Payer: Self-pay

## 2021-05-12 DIAGNOSIS — E538 Deficiency of other specified B group vitamins: Secondary | ICD-10-CM | POA: Diagnosis not present

## 2021-05-12 DIAGNOSIS — M419 Scoliosis, unspecified: Secondary | ICD-10-CM | POA: Diagnosis not present

## 2021-05-12 DIAGNOSIS — R29818 Other symptoms and signs involving the nervous system: Secondary | ICD-10-CM | POA: Diagnosis not present

## 2021-05-12 DIAGNOSIS — D693 Immune thrombocytopenic purpura: Secondary | ICD-10-CM | POA: Diagnosis not present

## 2021-05-12 DIAGNOSIS — I6340 Cerebral infarction due to embolism of unspecified cerebral artery: Secondary | ICD-10-CM | POA: Diagnosis not present

## 2021-05-12 DIAGNOSIS — Z1331 Encounter for screening for depression: Secondary | ICD-10-CM | POA: Diagnosis not present

## 2021-05-12 DIAGNOSIS — G319 Degenerative disease of nervous system, unspecified: Secondary | ICD-10-CM | POA: Diagnosis not present

## 2021-05-12 DIAGNOSIS — D649 Anemia, unspecified: Secondary | ICD-10-CM | POA: Insufficient documentation

## 2021-05-12 DIAGNOSIS — I10 Essential (primary) hypertension: Secondary | ICD-10-CM | POA: Diagnosis not present

## 2021-05-12 DIAGNOSIS — C9200 Acute myeloblastic leukemia, not having achieved remission: Secondary | ICD-10-CM | POA: Diagnosis not present

## 2021-05-12 DIAGNOSIS — R079 Chest pain, unspecified: Secondary | ICD-10-CM | POA: Diagnosis not present

## 2021-05-12 DIAGNOSIS — J9811 Atelectasis: Secondary | ICD-10-CM | POA: Diagnosis present

## 2021-05-12 DIAGNOSIS — R531 Weakness: Secondary | ICD-10-CM | POA: Diagnosis not present

## 2021-05-12 DIAGNOSIS — G9389 Other specified disorders of brain: Secondary | ICD-10-CM | POA: Diagnosis not present

## 2021-05-12 DIAGNOSIS — E039 Hypothyroidism, unspecified: Secondary | ICD-10-CM | POA: Diagnosis present

## 2021-05-12 DIAGNOSIS — J9601 Acute respiratory failure with hypoxia: Secondary | ICD-10-CM | POA: Diagnosis not present

## 2021-05-12 DIAGNOSIS — D7589 Other specified diseases of blood and blood-forming organs: Secondary | ICD-10-CM | POA: Diagnosis not present

## 2021-05-12 DIAGNOSIS — Z66 Do not resuscitate: Secondary | ICD-10-CM | POA: Diagnosis not present

## 2021-05-12 DIAGNOSIS — R7402 Elevation of levels of lactic acid dehydrogenase (LDH): Secondary | ICD-10-CM | POA: Diagnosis present

## 2021-05-12 DIAGNOSIS — E785 Hyperlipidemia, unspecified: Secondary | ICD-10-CM | POA: Diagnosis not present

## 2021-05-12 DIAGNOSIS — D61818 Other pancytopenia: Secondary | ICD-10-CM | POA: Diagnosis not present

## 2021-05-12 DIAGNOSIS — I639 Cerebral infarction, unspecified: Secondary | ICD-10-CM | POA: Diagnosis not present

## 2021-05-12 DIAGNOSIS — N182 Chronic kidney disease, stage 2 (mild): Secondary | ICD-10-CM | POA: Diagnosis not present

## 2021-05-12 DIAGNOSIS — Z8673 Personal history of transient ischemic attack (TIA), and cerebral infarction without residual deficits: Secondary | ICD-10-CM | POA: Diagnosis not present

## 2021-05-12 DIAGNOSIS — I2510 Atherosclerotic heart disease of native coronary artery without angina pectoris: Secondary | ICD-10-CM | POA: Diagnosis not present

## 2021-05-12 DIAGNOSIS — F0390 Unspecified dementia without behavioral disturbance: Secondary | ICD-10-CM | POA: Diagnosis present

## 2021-05-12 DIAGNOSIS — I773 Arterial fibromuscular dysplasia: Secondary | ICD-10-CM | POA: Diagnosis not present

## 2021-05-12 DIAGNOSIS — R29810 Facial weakness: Secondary | ICD-10-CM | POA: Diagnosis present

## 2021-05-12 DIAGNOSIS — I6503 Occlusion and stenosis of bilateral vertebral arteries: Secondary | ICD-10-CM | POA: Diagnosis not present

## 2021-05-12 DIAGNOSIS — I739 Peripheral vascular disease, unspecified: Secondary | ICD-10-CM | POA: Diagnosis not present

## 2021-05-12 DIAGNOSIS — R29702 NIHSS score 2: Secondary | ICD-10-CM | POA: Diagnosis present

## 2021-05-12 DIAGNOSIS — Z1339 Encounter for screening examination for other mental health and behavioral disorders: Secondary | ICD-10-CM | POA: Diagnosis not present

## 2021-05-12 DIAGNOSIS — D630 Anemia in neoplastic disease: Secondary | ICD-10-CM | POA: Diagnosis not present

## 2021-05-12 DIAGNOSIS — I4891 Unspecified atrial fibrillation: Secondary | ICD-10-CM | POA: Diagnosis present

## 2021-05-12 DIAGNOSIS — Z20822 Contact with and (suspected) exposure to covid-19: Secondary | ICD-10-CM | POA: Diagnosis not present

## 2021-05-12 DIAGNOSIS — R5383 Other fatigue: Secondary | ICD-10-CM | POA: Diagnosis not present

## 2021-05-12 DIAGNOSIS — F32A Depression, unspecified: Secondary | ICD-10-CM | POA: Diagnosis present

## 2021-05-12 DIAGNOSIS — M1712 Unilateral primary osteoarthritis, left knee: Secondary | ICD-10-CM | POA: Diagnosis present

## 2021-05-12 DIAGNOSIS — G459 Transient cerebral ischemic attack, unspecified: Secondary | ICD-10-CM | POA: Diagnosis not present

## 2021-05-12 DIAGNOSIS — D696 Thrombocytopenia, unspecified: Secondary | ICD-10-CM | POA: Diagnosis not present

## 2021-05-12 DIAGNOSIS — H409 Unspecified glaucoma: Secondary | ICD-10-CM | POA: Diagnosis present

## 2021-05-12 DIAGNOSIS — I63133 Cerebral infarction due to embolism of bilateral carotid arteries: Secondary | ICD-10-CM | POA: Diagnosis not present

## 2021-05-12 DIAGNOSIS — I129 Hypertensive chronic kidney disease with stage 1 through stage 4 chronic kidney disease, or unspecified chronic kidney disease: Secondary | ICD-10-CM | POA: Diagnosis not present

## 2021-05-12 DIAGNOSIS — H5462 Unqualified visual loss, left eye, normal vision right eye: Secondary | ICD-10-CM | POA: Diagnosis present

## 2021-05-12 DIAGNOSIS — R0789 Other chest pain: Secondary | ICD-10-CM | POA: Diagnosis not present

## 2021-05-12 DIAGNOSIS — G4733 Obstructive sleep apnea (adult) (pediatric): Secondary | ICD-10-CM | POA: Diagnosis not present

## 2021-05-12 DIAGNOSIS — R7301 Impaired fasting glucose: Secondary | ICD-10-CM | POA: Diagnosis not present

## 2021-05-12 DIAGNOSIS — R161 Splenomegaly, not elsewhere classified: Secondary | ICD-10-CM | POA: Diagnosis not present

## 2021-05-12 DIAGNOSIS — Z0000 Encounter for general adult medical examination without abnormal findings: Secondary | ICD-10-CM | POA: Diagnosis not present

## 2021-05-12 DIAGNOSIS — Z79899 Other long term (current) drug therapy: Secondary | ICD-10-CM | POA: Diagnosis not present

## 2021-05-12 DIAGNOSIS — I6389 Other cerebral infarction: Secondary | ICD-10-CM | POA: Diagnosis not present

## 2021-05-12 DIAGNOSIS — D509 Iron deficiency anemia, unspecified: Secondary | ICD-10-CM | POA: Diagnosis not present

## 2021-05-12 DIAGNOSIS — R413 Other amnesia: Secondary | ICD-10-CM | POA: Diagnosis not present

## 2021-05-12 DIAGNOSIS — R748 Abnormal levels of other serum enzymes: Secondary | ICD-10-CM | POA: Diagnosis not present

## 2021-05-12 DIAGNOSIS — N1831 Chronic kidney disease, stage 3a: Secondary | ICD-10-CM | POA: Diagnosis not present

## 2021-05-12 DIAGNOSIS — R900 Intracranial space-occupying lesion found on diagnostic imaging of central nervous system: Secondary | ICD-10-CM | POA: Diagnosis not present

## 2021-05-12 DIAGNOSIS — I6782 Cerebral ischemia: Secondary | ICD-10-CM | POA: Diagnosis not present

## 2021-05-12 DIAGNOSIS — G8194 Hemiplegia, unspecified affecting left nondominant side: Secondary | ICD-10-CM | POA: Diagnosis not present

## 2021-05-12 DIAGNOSIS — L120 Bullous pemphigoid: Secondary | ICD-10-CM | POA: Diagnosis not present

## 2021-05-12 DIAGNOSIS — Z736 Limitation of activities due to disability: Secondary | ICD-10-CM | POA: Diagnosis not present

## 2021-05-12 DIAGNOSIS — M47816 Spondylosis without myelopathy or radiculopathy, lumbar region: Secondary | ICD-10-CM | POA: Diagnosis present

## 2021-05-12 DIAGNOSIS — D6489 Other specified anemias: Secondary | ICD-10-CM | POA: Diagnosis not present

## 2021-05-12 LAB — CBC WITH DIFFERENTIAL/PLATELET
Abs Immature Granulocytes: 0.5 10*3/uL — ABNORMAL HIGH (ref 0.00–0.07)
Band Neutrophils: 10 %
Basophils Absolute: 0 10*3/uL (ref 0.0–0.1)
Basophils Relative: 0 %
Eosinophils Absolute: 0.8 10*3/uL — ABNORMAL HIGH (ref 0.0–0.5)
Eosinophils Relative: 0 %
HCT: 32.8 % — ABNORMAL LOW (ref 36.0–46.0)
Hemoglobin: 10.5 g/dL — ABNORMAL LOW (ref 12.0–15.0)
Lymphocytes Relative: 46 %
Lymphs Abs: 2.8 10*3/uL (ref 0.7–4.0)
MCH: 29.7 pg (ref 26.0–34.0)
MCHC: 32 g/dL (ref 30.0–36.0)
MCV: 92.9 fL (ref 80.0–100.0)
Metamyelocytes Relative: 5 %
Monocytes Absolute: 0 10*3/uL — ABNORMAL LOW (ref 0.1–1.0)
Monocytes Relative: 13 %
Myelocytes: 3 %
Neutro Abs: 1.7 10*3/uL (ref 1.7–7.7)
Neutrophils Relative %: 19 %
Other: 4 %
Platelets: 42 10*3/uL — ABNORMAL LOW (ref 150–400)
RBC: 3.53 MIL/uL — ABNORMAL LOW (ref 3.87–5.11)
RDW: 15.9 % — ABNORMAL HIGH (ref 11.5–15.5)
Smear Review: DECREASED
WBC: 6 10*3/uL (ref 4.0–10.5)
nRBC: 0.5 % — ABNORMAL HIGH (ref 0.0–0.2)

## 2021-05-12 LAB — COMPREHENSIVE METABOLIC PANEL
ALT: 13 U/L (ref 0–44)
AST: 21 U/L (ref 15–41)
Albumin: 3.3 g/dL — ABNORMAL LOW (ref 3.5–5.0)
Alkaline Phosphatase: 172 U/L — ABNORMAL HIGH (ref 38–126)
Anion gap: 11 (ref 5–15)
BUN: 13 mg/dL (ref 8–23)
CO2: 23 mmol/L (ref 22–32)
Calcium: 8.6 mg/dL — ABNORMAL LOW (ref 8.9–10.3)
Chloride: 103 mmol/L (ref 98–111)
Creatinine, Ser: 1.04 mg/dL — ABNORMAL HIGH (ref 0.44–1.00)
GFR, Estimated: 52 mL/min — ABNORMAL LOW (ref >60)
Glucose, Bld: 112 mg/dL — ABNORMAL HIGH (ref 70–99)
Potassium: 3.9 mmol/L (ref 3.5–5.1)
Sodium: 137 mmol/L (ref 135–145)
Total Bilirubin: 0.3 mg/dL (ref 0.3–1.2)
Total Protein: 6.8 g/dL (ref 6.5–8.1)

## 2021-05-12 LAB — D-DIMER, QUANTITATIVE: D-Dimer, Quant: 3.41 ug/mL-FEU — ABNORMAL HIGH (ref 0.00–0.50)

## 2021-05-12 LAB — BRAIN NATRIURETIC PEPTIDE: B Natriuretic Peptide: 109.2 pg/mL — ABNORMAL HIGH (ref 0.0–100.0)

## 2021-05-12 MED ORDER — IOHEXOL 350 MG/ML SOLN
50.0000 mL | Freq: Once | INTRAVENOUS | Status: AC | PRN
  Administered 2021-05-12: 50 mL via INTRAVENOUS

## 2021-05-12 NOTE — ED Provider Triage Note (Signed)
Emergency Medicine Provider Triage Evaluation Note ? ?Deborah Tucker , a 88 y.o. female  was evaluated in triage.  Pt complains of overall weakness and abnormal labs.  Sent from PCPs office due to D-dimer of 4.4, PLTs of 23, Hgb of 9.3.  Last week Hgb was 10.2.  Endorses 1 episode of chest pain on Friday that was more like discomfort.  This resolved on its own.  The chest pain was evaluated by primary care, had an EKG which was normal, and was suggested to be MSK. ? ?Review of Systems  ?Positive: As above ?Negative: As above ? ?Physical Exam  ?BP (!) 141/64 (BP Location: Right Arm)   Pulse 100   Temp 99 ?F (37.2 ?C) (Oral)   Resp 18   SpO2 93%  ?Gen:   Awake, no distress   ?Resp:  Normal effort CTAB ?MSK:   Moves extremities without difficulty  ?Other:  Chest non-TTP.  Heart rate 100 without M/R/G.  Radial pulses 2+ bilaterally.  CRT less than 2 seconds bilaterally. ? ?Medical Decision Making  ?Medically screening exam initiated at 6:39 PM.  Appropriate orders placed.  Deborah Tucker was informed that the remainder of the evaluation will be completed by another provider, this initial triage assessment does not replace that evaluation, and the importance of remaining in the ED until their evaluation is complete. ? ?Labs, EKG ordered ?  ?Deborah Rome, PA-C ?34/28/76 1902 ? ?

## 2021-05-12 NOTE — ED Triage Notes (Signed)
Pt sent from Dr. Silvestre Mesi office for abnormal labs.  Lab work this morning showed D Dimer of 4.4, platelets 23, hgb 9.3 (was 10.2 last week).  Pt has no symptoms today other than fatigue.  Does endorse an episode of chest pain last week that was evaluated with EKG and deemed musculoskeletal.  ?

## 2021-05-12 NOTE — ED Notes (Signed)
Patient placed in recliner for comfort 

## 2021-05-13 ENCOUNTER — Telehealth: Payer: Self-pay | Admitting: Nurse Practitioner

## 2021-05-13 DIAGNOSIS — R5383 Other fatigue: Secondary | ICD-10-CM | POA: Diagnosis not present

## 2021-05-13 DIAGNOSIS — Z20822 Contact with and (suspected) exposure to covid-19: Secondary | ICD-10-CM | POA: Diagnosis not present

## 2021-05-13 DIAGNOSIS — R0789 Other chest pain: Secondary | ICD-10-CM | POA: Diagnosis not present

## 2021-05-13 LAB — URINALYSIS, ROUTINE W REFLEX MICROSCOPIC
Bilirubin Urine: NEGATIVE
Glucose, UA: NEGATIVE mg/dL
Hgb urine dipstick: NEGATIVE
Ketones, ur: 20 mg/dL — AB
Nitrite: NEGATIVE
Protein, ur: 30 mg/dL — AB
Specific Gravity, Urine: >1.046 — ABNORMAL HIGH (ref 1.005–1.030)
pH: 5 (ref 5.0–8.0)

## 2021-05-13 LAB — PATHOLOGIST SMEAR REVIEW

## 2021-05-13 LAB — TROPONIN I (HIGH SENSITIVITY)
Troponin I (High Sensitivity): 6 ng/L (ref <18)
Troponin I (High Sensitivity): 8 ng/L (ref <18)

## 2021-05-13 MED ORDER — HYDRALAZINE HCL 25 MG PO TABS
25.0000 mg | ORAL_TABLET | Freq: Once | ORAL | Status: AC
  Administered 2021-05-13: 25 mg via ORAL
  Filled 2021-05-13: qty 1

## 2021-05-13 NOTE — Telephone Encounter (Signed)
Scheduled appt per 5/3 referral. Pt's daughter is aware of appt date and time. Pt's daughter is aware to arrive 15 mins prior to appt time and to bring and updated insurance card. Pt's daughter is aware of appt location.   ?

## 2021-05-13 NOTE — ED Provider Notes (Signed)
?[place] DEPT ?[facility] Emergency Department ?Provider Note ?MRN:  [id number]  ?Arrival date & time: 05/13/21    ? ?Chief Complaint   ?Abnormal Labs ?  ?History of Present Illness   ?Deborah Tucker is a 88 y.o. year-old female with a history of hypertension presenting to the ED with chief complaint of abnormal labs. ? ?Patient had chest pain last week lasting an hour or so.  For the past day patient has been feeling excessively tired, did not have any energy, stayed in bed all day yesterday.  Blood work done by PCP was abnormal, sent here for evaluation.  Patient currently feels normal. ? ?Review of Systems  ?A thorough review of systems was obtained and all systems are negative except as noted in the HPI and PMH.  ? ?Patient's Health History   ? ?Past Medical History:  ?Diagnosis Date  ? B12 deficiency   ? Bullous pemphigoid   ? Central retinal vein occlusion, left eye   ? Left eye blind  ? Cerebrovascular disease, unspecified   ? MRI indicates multiple small cortical infarcts.  ? Concussion   ? H/o fall with Skull fracture & concussion  ? DJD (degenerative joint disease), lumbar   ? Glaucoma, right eye   ? Hyperlipidemia   ? Hypertension   ? IFG (impaired fasting glucose)   ? Migraine headache   ? OSA (obstructive sleep apnea)   ? Intolerant of CPAP. Is supposed to wear nocturnal oxygen, but does not  ? Osteoarthritis of left knee   ? Osteopenia   ? Scoliosis deformity of spine   ? Seasonal allergies   ? Skull fracture ([place])   ? Wrist fracture 2015  ? R wrist  ?  ?Past Surgical History:  ?Procedure Laterality Date  ? ANKLE FRACTURE SURGERY Left   ? NM MYOVIEW LTD  10/2016  ? Only exercised 3: 34 min.  4.6 METs normal heart rate response.  Baseline hypertension.  Normal EF  65-70%.  No regional wall motion.  No ischemia or infarction.  Poor exercise tolerance.  No chronotropic incompetence.  ? OVARIAN CYST REMOVAL    ? VAGINAL HYSTERECTOMY    ?  ?Family History  ?Problem Relation Age of Onset  ?  Lung cancer Father   ? Heart disease Father   ? Hypertension Father   ? COPD Father   ? Dementia Sister   ? Seizures Sister   ? Heart attack Sister 51  ?     Damage was to the back of her heart.  ? AAA (abdominal aortic aneurysm) Sister   ? Hypertension Daughter   ? Hypertension Sister   ? Diabetes type II Sister   ? Dementia Maternal Grandmother   ?  ?Social History  ? ?Socioeconomic History  ? Marital status: Widowed  ?  Spouse name: Not on file  ? Number of children: 2  ? Years of education: 70  ? Highest education level: Not on file  ?Occupational History  ? Occupation: retired.  ?Tobacco Use  ? Smoking status: Never  ? Smokeless tobacco: Never  ?Vaping Use  ? Vaping Use: Never used  ?Substance and Sexual Activity  ? Alcohol use: Yes  ?  Comment: 1-2 glasses  ? Drug use: No  ? Sexual activity: Never  ?Other Topics Concern  ? Not on file  ?Social History Narrative  ? ** Merged History Encounter **  ?    ? Widowed, then divorced and remarried in 2007 to [place] (-- he subsequently developed  dementia) - now divorced from [place].  ?2 daughters - [name] (youngest, Lives in [place], [place]), [name] (older - lives in [place], [place]).  ?5 GC - (1 boy, 4 girls) ?1   ? yr college, Retired. ?Worked as a Training and development officer.  ?1-2 glasses of wine/day.  ? ?Social Determinants of Health  ? ?Financial Resource Strain: Not on file  ?Food Insecurity: Not on file  ?Transportation Needs: Not on file  ?Physical Activity: Not on file  ?Stress: Not on file  ?Social Connections: Not on file  ?Intimate Partner Violence: Not on file  ?  ? ?Physical Exam  ? ?Vitals:  ? 05/13/21 0530 05/13/21 0630  ?BP: (!) 159/69 (!) 154/65  ?Pulse: 97 96  ?Resp: 20 16  ?Temp:    ?SpO2: 96% 95%  ?  ?CONSTITUTIONAL: Well-appearing, NAD ?NEURO/PSYCH:  Alert and oriented x 3, no focal deficits ?EYES:  eyes equal and reactive ?ENT/NECK:  no LAD, no JVD ?CARDIO: Regular rate, well-perfused, normal S1 and S2 ?PULM:  CTAB no wheezing or rhonchi ?GI/GU:   non-distended, non-tender ?MSK/SPINE:  No gross deformities, no edema ?SKIN:  no rash, atraumatic ? ? ?*Additional and/or pertinent findings included in MDM below ? ?Diagnostic and Interventional Summary  ? ? EKG Interpretation ? ?Date/Time:  Tuesday May 12 2021 18:11:14 EDT ?Ventricular Rate:  88 ?PR Interval:  134 ?QRS Duration: 78 ?QT Interval:  358 ?QTC Calculation: 433 ?R Axis:   -8 ?Text Interpretation: Normal sinus rhythm Possible Anterior infarct , age undetermined Abnormal ECG No previous ECGs available Confirmed by Gerlene Fee [phone]) on 05/13/2021 5:48:28 AM ?  ? ?  ? ?Labs Reviewed  ?COMPREHENSIVE METABOLIC PANEL - Abnormal; Notable for the following components:  ?    Result Value  ? Glucose, Bld 112 (*)   ? Creatinine, Ser 1.04 (*)   ? Calcium 8.6 (*)   ? Albumin 3.3 (*)   ? Alkaline Phosphatase 172 (*)   ? GFR, Estimated 52 (*)   ? All other components within normal limits  ?BRAIN NATRIURETIC PEPTIDE - Abnormal; Notable for the following components:  ? B Natriuretic Peptide 109.2 (*)   ? All other components within normal limits  ?CBC WITH DIFFERENTIAL/PLATELET - Abnormal; Notable for the following components:  ? RBC 3.53 (*)   ? Hemoglobin 10.5 (*)   ? HCT 32.8 (*)   ? RDW 15.9 (*)   ? Platelets 42 (*)   ? nRBC 0.5 (*)   ? Monocytes Absolute 0.0 (*)   ? Eosinophils Absolute 0.8 (*)   ? Abs Immature Granulocytes 0.50 (*)   ? All other components within normal limits  ?D-DIMER, QUANTITATIVE - Abnormal; Notable for the following components:  ? D-Dimer, Quant 3.41 (*)   ? All other components within normal limits  ?URINALYSIS, ROUTINE W REFLEX MICROSCOPIC - Abnormal; Notable for the following components:  ? APPearance HAZY (*)   ? Specific Gravity, Urine >1.046 (*)   ? Ketones, ur 20 (*)   ? Protein, ur 30 (*)   ? Leukocytes,Ua MODERATE (*)   ? Bacteria, UA MANY (*)   ? All other components within normal limits  ?URINE CULTURE  ?PATHOLOGIST SMEAR REVIEW  ?TROPONIN I (HIGH SENSITIVITY)  ?TROPONIN I (HIGH  SENSITIVITY)  ?  ?CT Angio Chest PE W/Cm &/Or Wo Cm  ?Final Result  ?  ?  ?Medications  ?iohexol (OMNIPAQUE) 350 MG/ML injection 50 mL (50 mLs Intravenous Contrast Given 05/12/21 2317)  ?hydrALAZINE (APRESOLINE) tablet 25 mg (  25 mg Oral Given 05/13/21 0210)  ?  ? ?Procedures  /  Critical Care ?Procedures ? ?ED Course and Medical Decision Making  ?Initial Impression and Ddx ?Chest pain, favoring MSK given the focal nature, tender to palpation, improved with Tylenol and no longer present.  ACS is also considered, awaiting EKG and troponin.  Patient's fatigue has a broad differential diagnosis.  Labs done at PCP office reveal elevated D-dimer, low platelets, low hemoglobin.  Will need work-up for PE, electrolyte disturbance, anemia, blood dyscrasia. ? ?Past medical/surgical history that increases complexity of ED encounter: None ? ?Interpretation of Diagnostics ?I personally reviewed the EKG and my interpretation is as follows: Sinus rhythm ?   ?Labs reveal a mild anemia similar to baseline hemoglobin, platelets of 40, otherwise reassuring.  Troponin is negative. ? ?Patient Reassessment and Ultimate Disposition/Management ?With negative troponins and negative CTA, overall there is low concern for cardiopulmonary cause of patient's chest pain that occurred last week.  She is appropriate for outpatient follow-up regarding this issue.  Fatigue similarly without obvious cause, we discussed the possibility of bone marrow issue given the decrease in multiple blood lines.  She will follow-up with primary care doctor and/or hematology. ? ?Patient management required discussion with the following services or consulting groups:  None ? ?Complexity of Problems Addressed ?Acute illness or injury that poses threat of life of bodily function ? ?Additional Data Reviewed and Analyzed ?Further history obtained from: ?Further history from spouse/family member ? ?Additional Factors Impacting ED Encounter Risk ?Consideration of  hospitalization ? ?Barth Kirks. Sedonia Small, MD ?[facility] ?[email or web address] ? ?Final Clinical Impressions(s) / ED Diagnoses  ? ?  ICD-10-CM   ?1. Other fatigue  R53.83   ?  ?2. Donney Dice

## 2021-05-13 NOTE — Discharge Instructions (Signed)
You were evaluated in the Emergency Department and after careful evaluation, we did not find any emergent condition requiring admission or further testing in the hospital. ? ?Your exam/testing today is overall reassuring.  No signs of heart attack or blood clots.  Recommend follow-up with primary care doctor and/or hematology as we discussed. ? ?Please return to the Emergency Department if you experience any worsening of your condition.   Thank you for allowing [place] to be a part of your care. ?

## 2021-05-14 ENCOUNTER — Other Ambulatory Visit: Payer: Self-pay

## 2021-05-14 ENCOUNTER — Encounter: Payer: Self-pay | Admitting: Nurse Practitioner

## 2021-05-14 ENCOUNTER — Inpatient Hospital Stay: Payer: Medicare Other | Attending: Nurse Practitioner | Admitting: Nurse Practitioner

## 2021-05-14 ENCOUNTER — Inpatient Hospital Stay (HOSPITAL_COMMUNITY): Payer: Medicare Other

## 2021-05-14 ENCOUNTER — Telehealth: Payer: Self-pay

## 2021-05-14 ENCOUNTER — Emergency Department (HOSPITAL_COMMUNITY): Payer: Medicare Other

## 2021-05-14 ENCOUNTER — Inpatient Hospital Stay: Payer: Medicare Other

## 2021-05-14 ENCOUNTER — Inpatient Hospital Stay (HOSPITAL_COMMUNITY)
Admission: EM | Admit: 2021-05-14 | Discharge: 2021-05-21 | DRG: 064 | Disposition: A | Discharge status: Skilled Nursing Facility | Payer: Medicare Other | Source: Ambulatory Visit | Attending: Family Medicine | Admitting: Family Medicine

## 2021-05-14 VITALS — BP 115/54 | HR 84 | Temp 97.6°F | Resp 15 | Ht 63.5 in | Wt 139.2 lb

## 2021-05-14 DIAGNOSIS — I6340 Cerebral infarction due to embolism of unspecified cerebral artery: Principal | ICD-10-CM | POA: Diagnosis present

## 2021-05-14 DIAGNOSIS — J9811 Atelectasis: Secondary | ICD-10-CM | POA: Diagnosis present

## 2021-05-14 DIAGNOSIS — I10 Essential (primary) hypertension: Secondary | ICD-10-CM | POA: Diagnosis not present

## 2021-05-14 DIAGNOSIS — I6389 Other cerebral infarction: Secondary | ICD-10-CM | POA: Diagnosis not present

## 2021-05-14 DIAGNOSIS — Z833 Family history of diabetes mellitus: Secondary | ICD-10-CM

## 2021-05-14 DIAGNOSIS — E039 Hypothyroidism, unspecified: Secondary | ICD-10-CM | POA: Diagnosis present

## 2021-05-14 DIAGNOSIS — J9601 Acute respiratory failure with hypoxia: Secondary | ICD-10-CM

## 2021-05-14 DIAGNOSIS — R0902 Hypoxemia: Secondary | ICD-10-CM | POA: Diagnosis not present

## 2021-05-14 DIAGNOSIS — D649 Anemia, unspecified: Secondary | ICD-10-CM | POA: Diagnosis not present

## 2021-05-14 DIAGNOSIS — Z8673 Personal history of transient ischemic attack (TIA), and cerebral infarction without residual deficits: Secondary | ICD-10-CM | POA: Diagnosis not present

## 2021-05-14 DIAGNOSIS — H5462 Unqualified visual loss, left eye, normal vision right eye: Secondary | ICD-10-CM | POA: Diagnosis present

## 2021-05-14 DIAGNOSIS — N1831 Chronic kidney disease, stage 3a: Secondary | ICD-10-CM | POA: Diagnosis present

## 2021-05-14 DIAGNOSIS — N183 Chronic kidney disease, stage 3 unspecified: Secondary | ICD-10-CM

## 2021-05-14 DIAGNOSIS — D7589 Other specified diseases of blood and blood-forming organs: Secondary | ICD-10-CM | POA: Diagnosis not present

## 2021-05-14 DIAGNOSIS — Z79899 Other long term (current) drug therapy: Secondary | ICD-10-CM | POA: Diagnosis not present

## 2021-05-14 DIAGNOSIS — D61818 Other pancytopenia: Secondary | ICD-10-CM | POA: Diagnosis present

## 2021-05-14 DIAGNOSIS — M1712 Unilateral primary osteoarthritis, left knee: Secondary | ICD-10-CM | POA: Diagnosis present

## 2021-05-14 DIAGNOSIS — I6782 Cerebral ischemia: Secondary | ICD-10-CM | POA: Diagnosis not present

## 2021-05-14 DIAGNOSIS — H409 Unspecified glaucoma: Secondary | ICD-10-CM | POA: Diagnosis present

## 2021-05-14 DIAGNOSIS — I639 Cerebral infarction, unspecified: Secondary | ICD-10-CM | POA: Diagnosis present

## 2021-05-14 DIAGNOSIS — I4891 Unspecified atrial fibrillation: Secondary | ICD-10-CM | POA: Diagnosis present

## 2021-05-14 DIAGNOSIS — E785 Hyperlipidemia, unspecified: Secondary | ICD-10-CM | POA: Diagnosis present

## 2021-05-14 DIAGNOSIS — I773 Arterial fibromuscular dysplasia: Secondary | ICD-10-CM | POA: Diagnosis not present

## 2021-05-14 DIAGNOSIS — G8194 Hemiplegia, unspecified affecting left nondominant side: Secondary | ICD-10-CM | POA: Diagnosis present

## 2021-05-14 DIAGNOSIS — Z736 Limitation of activities due to disability: Secondary | ICD-10-CM | POA: Diagnosis not present

## 2021-05-14 DIAGNOSIS — G459 Transient cerebral ischemic attack, unspecified: Secondary | ICD-10-CM | POA: Diagnosis not present

## 2021-05-14 DIAGNOSIS — D693 Immune thrombocytopenic purpura: Secondary | ICD-10-CM

## 2021-05-14 DIAGNOSIS — R29810 Facial weakness: Secondary | ICD-10-CM | POA: Diagnosis present

## 2021-05-14 DIAGNOSIS — L120 Bullous pemphigoid: Secondary | ICD-10-CM | POA: Diagnosis present

## 2021-05-14 DIAGNOSIS — M47816 Spondylosis without myelopathy or radiculopathy, lumbar region: Secondary | ICD-10-CM | POA: Diagnosis present

## 2021-05-14 DIAGNOSIS — R7402 Elevation of levels of lactic acid dehydrogenase (LDH): Secondary | ICD-10-CM | POA: Diagnosis present

## 2021-05-14 DIAGNOSIS — R161 Splenomegaly, not elsewhere classified: Secondary | ICD-10-CM | POA: Diagnosis not present

## 2021-05-14 DIAGNOSIS — Z825 Family history of asthma and other chronic lower respiratory diseases: Secondary | ICD-10-CM

## 2021-05-14 DIAGNOSIS — R29702 NIHSS score 2: Secondary | ICD-10-CM | POA: Diagnosis present

## 2021-05-14 DIAGNOSIS — C9200 Acute myeloblastic leukemia, not having achieved remission: Secondary | ICD-10-CM | POA: Diagnosis present

## 2021-05-14 DIAGNOSIS — R29818 Other symptoms and signs involving the nervous system: Secondary | ICD-10-CM | POA: Diagnosis not present

## 2021-05-14 DIAGNOSIS — I739 Peripheral vascular disease, unspecified: Secondary | ICD-10-CM | POA: Diagnosis not present

## 2021-05-14 DIAGNOSIS — Z8249 Family history of ischemic heart disease and other diseases of the circulatory system: Secondary | ICD-10-CM

## 2021-05-14 DIAGNOSIS — G4733 Obstructive sleep apnea (adult) (pediatric): Secondary | ICD-10-CM | POA: Diagnosis present

## 2021-05-14 DIAGNOSIS — Z79890 Hormone replacement therapy (postmenopausal): Secondary | ICD-10-CM

## 2021-05-14 DIAGNOSIS — I129 Hypertensive chronic kidney disease with stage 1 through stage 4 chronic kidney disease, or unspecified chronic kidney disease: Secondary | ICD-10-CM | POA: Diagnosis present

## 2021-05-14 DIAGNOSIS — E538 Deficiency of other specified B group vitamins: Secondary | ICD-10-CM | POA: Diagnosis present

## 2021-05-14 DIAGNOSIS — R900 Intracranial space-occupying lesion found on diagnostic imaging of central nervous system: Secondary | ICD-10-CM | POA: Diagnosis not present

## 2021-05-14 DIAGNOSIS — Z7902 Long term (current) use of antithrombotics/antiplatelets: Secondary | ICD-10-CM

## 2021-05-14 DIAGNOSIS — Z8781 Personal history of (healed) traumatic fracture: Secondary | ICD-10-CM

## 2021-05-14 DIAGNOSIS — I6503 Occlusion and stenosis of bilateral vertebral arteries: Secondary | ICD-10-CM | POA: Diagnosis not present

## 2021-05-14 DIAGNOSIS — F0390 Unspecified dementia without behavioral disturbance: Secondary | ICD-10-CM | POA: Diagnosis present

## 2021-05-14 DIAGNOSIS — D6489 Other specified anemias: Secondary | ICD-10-CM | POA: Diagnosis not present

## 2021-05-14 DIAGNOSIS — Z90710 Acquired absence of both cervix and uterus: Secondary | ICD-10-CM

## 2021-05-14 DIAGNOSIS — G9389 Other specified disorders of brain: Secondary | ICD-10-CM | POA: Diagnosis not present

## 2021-05-14 DIAGNOSIS — D696 Thrombocytopenia, unspecified: Secondary | ICD-10-CM | POA: Diagnosis not present

## 2021-05-14 DIAGNOSIS — R531 Weakness: Secondary | ICD-10-CM | POA: Diagnosis not present

## 2021-05-14 DIAGNOSIS — Z801 Family history of malignant neoplasm of trachea, bronchus and lung: Secondary | ICD-10-CM

## 2021-05-14 DIAGNOSIS — Z880 Allergy status to penicillin: Secondary | ICD-10-CM

## 2021-05-14 DIAGNOSIS — Z66 Do not resuscitate: Secondary | ICD-10-CM | POA: Diagnosis not present

## 2021-05-14 DIAGNOSIS — I63133 Cerebral infarction due to embolism of bilateral carotid arteries: Secondary | ICD-10-CM | POA: Diagnosis not present

## 2021-05-14 DIAGNOSIS — F32A Depression, unspecified: Secondary | ICD-10-CM | POA: Diagnosis present

## 2021-05-14 DIAGNOSIS — R079 Chest pain, unspecified: Secondary | ICD-10-CM | POA: Diagnosis present

## 2021-05-14 DIAGNOSIS — M419 Scoliosis, unspecified: Secondary | ICD-10-CM | POA: Diagnosis not present

## 2021-05-14 DIAGNOSIS — R5383 Other fatigue: Secondary | ICD-10-CM | POA: Diagnosis not present

## 2021-05-14 DIAGNOSIS — Z87820 Personal history of traumatic brain injury: Secondary | ICD-10-CM

## 2021-05-14 DIAGNOSIS — G939 Disorder of brain, unspecified: Principal | ICD-10-CM

## 2021-05-14 DIAGNOSIS — D630 Anemia in neoplastic disease: Secondary | ICD-10-CM | POA: Diagnosis not present

## 2021-05-14 DIAGNOSIS — G319 Degenerative disease of nervous system, unspecified: Secondary | ICD-10-CM | POA: Diagnosis not present

## 2021-05-14 LAB — COMPREHENSIVE METABOLIC PANEL
ALT: 14 U/L (ref 0–44)
AST: 21 U/L (ref 15–41)
Albumin: 3.5 g/dL (ref 3.5–5.0)
Alkaline Phosphatase: 162 U/L — ABNORMAL HIGH (ref 38–126)
Anion gap: 8 (ref 5–15)
BUN: 17 mg/dL (ref 8–23)
CO2: 23 mmol/L (ref 22–32)
Calcium: 8.3 mg/dL — ABNORMAL LOW (ref 8.9–10.3)
Chloride: 105 mmol/L (ref 98–111)
Creatinine, Ser: 1.28 mg/dL — ABNORMAL HIGH (ref 0.44–1.00)
GFR, Estimated: 40 mL/min — ABNORMAL LOW (ref >60)
Glucose, Bld: 112 mg/dL — ABNORMAL HIGH (ref 70–99)
Potassium: 3.9 mmol/L (ref 3.5–5.1)
Sodium: 136 mmol/L (ref 135–145)
Total Bilirubin: 0.6 mg/dL (ref 0.3–1.2)
Total Protein: 7.2 g/dL (ref 6.5–8.1)

## 2021-05-14 LAB — RETIC PANEL
Immature Retic Fract: 13 % (ref 2.3–15.9)
RBC.: 3.48 MIL/uL — ABNORMAL LOW (ref 3.87–5.11)
Retic Count, Absolute: 20.9 10*3/uL (ref 19.0–186.0)
Retic Ct Pct: 0.6 % (ref 0.4–3.1)
Reticulocyte Hemoglobin: 35.8 pg (ref >27.9)

## 2021-05-14 LAB — CBC WITH DIFFERENTIAL/PLATELET
Abs Immature Granulocytes: 0 10*3/uL (ref 0.00–0.07)
Band Neutrophils: 2 %
Basophils Absolute: 0.1 10*3/uL (ref 0.0–0.1)
Basophils Relative: 1 %
Blasts: 2 %
Eosinophils Absolute: 0.1 10*3/uL (ref 0.0–0.5)
Eosinophils Relative: 1 %
HCT: 33 % — ABNORMAL LOW (ref 36.0–46.0)
Hemoglobin: 10.7 g/dL — ABNORMAL LOW (ref 12.0–15.0)
Lymphocytes Relative: 43 %
Lymphs Abs: 3.1 10*3/uL (ref 0.7–4.0)
MCH: 30.1 pg (ref 26.0–34.0)
MCHC: 32.4 g/dL (ref 30.0–36.0)
MCV: 93 fL (ref 80.0–100.0)
Monocytes Absolute: 2.1 10*3/uL — ABNORMAL HIGH (ref 0.1–1.0)
Monocytes Relative: 30 %
Neutro Abs: 1.6 10*3/uL — ABNORMAL LOW (ref 1.7–7.7)
Neutrophils Relative %: 21 %
Platelets: 34 10*3/uL — ABNORMAL LOW (ref 150–400)
RBC: 3.55 MIL/uL — ABNORMAL LOW (ref 3.87–5.11)
RDW: 16.1 % — ABNORMAL HIGH (ref 11.5–15.5)
WBC Morphology: ABNORMAL
WBC: 7.1 10*3/uL (ref 4.0–10.5)
nRBC: 0.4 % — ABNORMAL HIGH (ref 0.0–0.2)
nRBC: 2 /100 WBC — ABNORMAL HIGH

## 2021-05-14 LAB — IRON AND TIBC
Iron: 141 ug/dL (ref 28–170)
Saturation Ratios: 43 % — ABNORMAL HIGH (ref 10.4–31.8)
TIBC: 331 ug/dL (ref 250–450)
UIBC: 190 ug/dL

## 2021-05-14 LAB — IMMATURE PLATELET FRACTION: Immature Platelet Fraction: 12 % — ABNORMAL HIGH (ref 1.2–8.6)

## 2021-05-14 LAB — URINE CULTURE: Culture: <10000 — AB

## 2021-05-14 LAB — FERRITIN: Ferritin: 1013 ng/mL — ABNORMAL HIGH (ref 11–307)

## 2021-05-14 LAB — LACTATE DEHYDROGENASE: LDH: 445 U/L — ABNORMAL HIGH (ref 98–192)

## 2021-05-14 MED ORDER — GALANTAMINE HYDROBROMIDE ER 8 MG PO CP24
16.0000 mg | ORAL_CAPSULE | Freq: Every day | ORAL | Status: DC
  Administered 2021-05-15 – 2021-05-21 (×7): 16 mg via ORAL
  Filled 2021-05-14 (×7): qty 2

## 2021-05-14 MED ORDER — EZETIMIBE 10 MG PO TABS
10.0000 mg | ORAL_TABLET | Freq: Every day | ORAL | Status: DC
Start: 2021-05-15 — End: 2021-05-21
  Administered 2021-05-15 – 2021-05-21 (×7): 10 mg via ORAL
  Filled 2021-05-14 (×7): qty 1

## 2021-05-14 MED ORDER — STROKE: EARLY STAGES OF RECOVERY BOOK
Freq: Once | Status: AC
  Filled 2021-05-14: qty 1

## 2021-05-14 MED ORDER — ACETAMINOPHEN 160 MG/5ML PO SOLN
650.0000 mg | ORAL | Status: DC | PRN
  Filled 2021-05-14: qty 20.3

## 2021-05-14 MED ORDER — PANTOPRAZOLE SODIUM 40 MG PO TBEC
40.0000 mg | DELAYED_RELEASE_TABLET | Freq: Every day | ORAL | Status: DC
  Administered 2021-05-15 – 2021-05-18 (×4): 40 mg via ORAL
  Filled 2021-05-14 (×4): qty 1

## 2021-05-14 MED ORDER — IOHEXOL 350 MG/ML SOLN
75.0000 mL | Freq: Once | INTRAVENOUS | Status: AC | PRN
  Administered 2021-05-14: 75 mL via INTRAVENOUS

## 2021-05-14 MED ORDER — GADOBUTROL 1 MMOL/ML IV SOLN
6.0000 mL | Freq: Once | INTRAVENOUS | Status: AC | PRN
  Administered 2021-05-14: 6 mL via INTRAVENOUS

## 2021-05-14 MED ORDER — MEMANTINE HCL 10 MG PO TABS
10.0000 mg | ORAL_TABLET | Freq: Two times a day (BID) | ORAL | Status: DC
  Administered 2021-05-14 – 2021-05-21 (×14): 10 mg via ORAL
  Filled 2021-05-14 (×14): qty 1

## 2021-05-14 MED ORDER — ACETAMINOPHEN 650 MG RE SUPP: 650.0000 mg | RECTAL | Status: DC | PRN

## 2021-05-14 MED ORDER — LATANOPROST 0.005 % OP SOLN
1.0000 [drp] | Freq: Every day | OPHTHALMIC | Status: DC
  Administered 2021-05-15 – 2021-05-21 (×7): 1 [drp] via OPHTHALMIC
  Filled 2021-05-14: qty 2.5

## 2021-05-14 MED ORDER — ACETAMINOPHEN 325 MG PO TABS: 650.0000 mg | ORAL_TABLET | ORAL | Status: DC | PRN

## 2021-05-14 MED ORDER — METOPROLOL SUCCINATE ER 25 MG PO TB24
25.0000 mg | ORAL_TABLET | Freq: Two times a day (BID) | ORAL | Status: DC
  Administered 2021-05-14 – 2021-05-21 (×14): 25 mg via ORAL
  Filled 2021-05-14 (×14): qty 1

## 2021-05-14 MED ORDER — LEVOTHYROXINE SODIUM 50 MCG PO TABS: 50.0000 ug | ORAL_TABLET | Freq: Every day | ORAL | Status: DC

## 2021-05-14 MED ORDER — SENNOSIDES-DOCUSATE SODIUM 8.6-50 MG PO TABS
1.0000 | ORAL_TABLET | Freq: Every day | ORAL | Status: DC
  Administered 2021-05-14: 1 via ORAL
  Filled 2021-05-14 (×2): qty 1

## 2021-05-14 MED ORDER — TIMOLOL MALEATE 0.5 % OP SOLN
1.0000 [drp] | Freq: Two times a day (BID) | OPHTHALMIC | Status: DC
  Administered 2021-05-15 – 2021-05-21 (×14): 1 [drp] via OPHTHALMIC
  Filled 2021-05-14: qty 5

## 2021-05-14 MED ORDER — LEVOTHYROXINE SODIUM 100 MCG PO TABS
100.0000 ug | ORAL_TABLET | ORAL | Status: DC
  Administered 2021-05-16: 100 ug via ORAL
  Filled 2021-05-14 (×2): qty 1

## 2021-05-14 MED ORDER — ASPIRIN 325 MG PO TABS
325.0000 mg | ORAL_TABLET | Freq: Every day | ORAL | Status: DC
  Filled 2021-05-14: qty 1

## 2021-05-14 MED ORDER — ESCITALOPRAM OXALATE 10 MG PO TABS
5.0000 mg | ORAL_TABLET | Freq: Every day | ORAL | Status: DC
  Administered 2021-05-15 – 2021-05-18 (×4): 5 mg via ORAL
  Filled 2021-05-14 (×4): qty 1

## 2021-05-14 MED ORDER — SODIUM CHLORIDE 0.9 % IV SOLN: INTRAVENOUS | Status: DC

## 2021-05-14 MED ORDER — TRAMADOL HCL 50 MG PO TABS: 50.0000 mg | ORAL_TABLET | Freq: Three times a day (TID) | ORAL | Status: DC | PRN

## 2021-05-14 MED ORDER — LEVOTHYROXINE SODIUM 50 MCG PO TABS
50.0000 ug | ORAL_TABLET | ORAL | Status: DC
  Administered 2021-05-15 – 2021-05-21 (×6): 50 ug via ORAL
  Filled 2021-05-14 (×6): qty 1

## 2021-05-14 NOTE — ED Notes (Signed)
Provided report to [facility]. 

## 2021-05-14 NOTE — ED Provider Notes (Signed)
?[facility] DEPT ?Provider Note ? ? ?CSN: [id number] ?Arrival date & time: 05/14/21  1250 ? ?  ? ?History ? ?No chief complaint on file. ? ? ?Deborah Tucker is a 88 y.o. female presenting to the emergency department with concern for left-sided foot weakness.  His daughter reports that they were at the cancer center today having routine labs done, and the patient appeared to be dragging her left foot.  Daughter reports the patient has been fatigued all week.  She normally walks without difficulty.  The patient herself is unaware that she was dragging her foot, feels that she is back to normal at this point.  She does have a history of TIA, but they are not certain what her symptoms were at the time, only that she was diagnosed on subsequent neuroimaging with possible prior stroke.  She takes Plavix. ? ?HPI ? ?  ? ?Home Medications ?Prior to Admission medications   ?Medication Sig Start Date End Date Taking? Authorizing Provider  ?acetaminophen (TYLENOL) 500 MG tablet Take 1,000 mg by mouth in the morning, at noon, and at bedtime. And as needed for pain   Yes [provider]  ?Ascorbic Acid (VITAMIN C) 500 MG CHEW Chew 500 mg by mouth in the morning.   Yes [provider]  ?Calcium Carbonate-Vit D-Min (CALCIUM 600+D3 PLUS MINERALS) 600-800 MG-UNIT TABS Take 1 tablet by mouth in the morning.   Yes [provider]  ?clopidogrel (PLAVIX) 75 MG tablet Take 75 mg by mouth daily.   Yes [provider]  ?EPINEPHrine 0.3 mg/0.3 mL IJ SOAJ injection Inject 0.3 mg into the muscle once as needed for anaphylaxis.   Yes [provider]  ?escitalopram (LEXAPRO) 5 MG tablet Take 5 mg by mouth every evening.   Yes [provider]  ?estrogens, conjugated, (PREMARIN) 0.3 MG tablet Take 0.3 mg by mouth daily.   Yes [provider]  ?Evolocumab 140 MG/ML SOAJ Inject 140 mg into the skin every 14 (fourteen) days.   Yes [provider]   ?ezetimibe (ZETIA) 10 MG tablet Take 10 mg by mouth daily.   Yes [provider]  ?furosemide (LASIX) 20 MG tablet Take 20 mg by mouth See admin instructions. Take 20 mg once daily on Tue, Fri   Yes [provider]  ?galantamine (RAZADYNE ER) 16 MG 24 hr capsule Take 16 mg by mouth daily. 04/03/21  Yes [provider]  ?hydrALAZINE (APRESOLINE) 25 MG tablet Take 25 mg by mouth 2 (two) times daily.   Yes [provider]  ?latanoprost (XALATAN) 0.005 % ophthalmic solution Place 1 drop into both eyes at bedtime. 05/26/15  Yes [provider]  ?levothyroxine (SYNTHROID) 50 MCG tablet Take 50 mcg by mouth See admin instructions. Take 50 mcg on Sun, Mon, Tue, Wed, Thu, Fri and 100 mcg on Saturday.   Yes [provider]  ?melatonin 3 MG TABS tablet Take 3 mg by mouth at bedtime.   Yes [provider]  ?memantine (NAMENDA) 10 MG tablet Take 10 mg by mouth 2 (two) times daily.   Yes [provider]  ?metoprolol succinate (TOPROL-XL) 50 MG 24 hr tablet Take 25 mg by mouth 2 (two) times daily.   Yes [provider]  ?Multiple Vitamins-Minerals (ICAPS AREDS 2) CAPS Take 1 capsule by mouth 2 (two) times daily.   Yes [provider]  ?olmesartan (BENICAR) 40 MG tablet Take 40 mg by mouth every evening.   Yes [provider]  ?pantoprazole (PROTONIX) 40 MG tablet Take 40 mg by mouth daily.   Yes [provider]  ?Probiotic Product ([place]) CAPS Take 1 capsule by mouth daily.   Yes [provider]  ?timolol (TIMOPTIC) 0.5 % ophthalmic solution Place 1 drop into both eyes 2 (two) times daily. 06/09/15  Yes [provider]  ?traMADol (ULTRAM) 50 MG tablet Take 50 mg by mouth 3 (three) times daily as needed for severe pain. 02/05/21  Yes [provider]  ?vitamin B-12 (CYANOCOBALAMIN) 1000 MCG tablet Take 1,000 mcg by mouth daily.   Yes [provider]  ?zinc oxide 20 % ointment  Apply 1 application. topically daily as needed for irritation. To buttocks after every incontinent episode and as needed.   Yes [provider]  ?   ? ?Allergies    ?Penicillins   ? ?Review of Systems   ?Review of Systems ? ?Physical Exam ?Updated Vital Signs ?BP 139/69   Pulse 71   Resp 14   Ht 5' 3.5" (1.613 m)   Wt 63.1 kg   SpO2 92%   BMI 24.27 kg/m?  ?Physical Exam ?Constitutional:   ?   General: She is not in acute distress. ?HENT:  ?   Head: Normocephalic and atraumatic.  ?Eyes:  ?   Conjunctiva/sclera: Conjunctivae normal.  ?   Pupils: Pupils are equal, round, and reactive to light.  ?Cardiovascular:  ?   Rate and Rhythm: Normal rate and regular rhythm.  ?Pulmonary:  ?   Effort: Pulmonary effort is normal. No respiratory distress.  ?Abdominal:  ?   General: There is no distension.  ?   Tenderness: There is no abdominal tenderness.  ?Skin: ?   General: Skin is warm and dry.  ?Neurological:  ?   General: No focal deficit present.  ?   Mental Status: She is alert and oriented to person, place, and time. Mental status is at baseline.  ?   Cranial Nerves: No cranial nerve deficit.  ?   Sensory: No sensory deficit.  ?   Motor: No weakness.  ?Psychiatric:     ?   Mood and Affect: Mood normal.     ?   Behavior: Behavior normal.  ? ? ?ED Results / Procedures / Treatments   ?Labs ?(all labs ordered are listed, but only abnormal results are displayed) ?Labs Reviewed  ?RETIC PANEL - Abnormal; Notable for the following components:  ?    Result Value  ? RBC. 3.48 (*)   ? All other components within normal limits  ?LACTATE DEHYDROGENASE - Abnormal; Notable for the following components:  ? LDH 445 (*)   ? All other components within normal limits  ?IRON AND TIBC - Abnormal; Notable for the following components:  ? Saturation Ratios 43 (*)   ? All other components within normal limits  ?IMMATURE PLATELET FRACTION - Abnormal; Notable for the following components:  ? Immature Platelet Fraction 12.0 (*)   ? All  other components within normal limits  ?FERRITIN - Abnormal; Notable for the following components:  ? Ferritin 1,013 (*)   ? All other components within normal limits  ?COMPREHENSIVE METABOLIC PANEL - Abnormal; Notable for the following components:  ? Glucose, Bld 112 (*)   ? Creatinine, Ser 1.28 (*)   ? Calcium 8.3 (*)   ? Alkaline Phosphatase 162 (*)   ? GFR, Estimated 40 (*)   ? All other components within normal limits  ?CBC WITH DIFFERENTIAL/PLATELET - Abnormal; Notable  for the following components:  ? RBC 3.55 (*)   ? Hemoglobin 10.7 (*)   ? HCT 33.0 (*)   ? RDW 16.1 (*)   ? Platelets 34 (*)   ? nRBC 0.4 (*)   ? Neutro Abs 1.6 (*)   ? Monocytes Absolute 2.1 (*)   ? nRBC 2 (*)   ? All other components within normal limits  ?FOLATE RBC  ?ERYTHROPOIETIN  ?HAPTOGLOBIN  ?MULTIPLE MYELOMA PANEL, SERUM  ?METHYLMALONIC ACID, SERUM  ?PATHOLOGIST SMEAR REVIEW  ? ? ?EKG ?None ? ?Radiology ?CT Angio Chest PE W/Cm &/Or Wo Cm ? ?Result Date: 05/12/2021 ?CLINICAL DATA:  Weakness and abnormal labs. EXAM: CT ANGIOGRAPHY CHEST WITH CONTRAST TECHNIQUE: Multidetector CT imaging of the chest was performed using the standard protocol during bolus administration of intravenous contrast. Multiplanar CT image reconstructions and MIPs were obtained to evaluate the vascular anatomy. RADIATION DOSE REDUCTION: This exam was performed according to the departmental dose-optimization program which includes automated exposure control, adjustment of the mA and/or kV according to patient size and/or use of iterative reconstruction technique. CONTRAST:  88m OMNIPAQUE IOHEXOL 350 MG/ML SOLN COMPARISON:  None Available. FINDINGS: Cardiovascular: There is mild calcification of the aortic arch, without evidence of aortic aneurysm. Satisfactory opacification of the pulmonary arteries to the segmental level. No evidence of pulmonary embolism. Normal heart size with marked severity coronary artery calcification. No pericardial effusion.  Mediastinum/Nodes: No enlarged mediastinal, hilar, or axillary lymph nodes. Thyroid gland, trachea, and esophagus demonstrate no significant findings. Lungs/Pleura: Mild atelectatic changes are seen along the posterior aspect of

## 2021-05-14 NOTE — ED Triage Notes (Signed)
Patient from the cancer center. Patient began to have left foot dragging about 30 minutes ago. Daughter reports patient having multiple TIAs in the past.  ?

## 2021-05-14 NOTE — ED Notes (Signed)
Patient at MRI. Will update vitals when patient returns. ?

## 2021-05-14 NOTE — H&P (Addendum)
? ? ?History and Physical ? ?Deborah Tucker JJO:[id number] DOB: February 22, 1930 DOA: 05/14/2021 ? ?PCP: Crist Infante, MD ?Patient coming from: Home ? ?I have personally briefly reviewed patient's old medical records in [place] ? ? ?Chief Complaint: left foot weakness ? ?HPI: Deborah Tucker is a 88 y.o. female past medical history of essential hypertension, B12 deficiency central renal artery occlusion CVA anemia and thrombocytopenia and anemia for which she is currently being worked up by hematology, who woke up today very tired so her hematologist sent her for labs when she was dragging her foot there was no loss of consciousness or sphincter control.  Referred to the ED and found to have with left-sided weakness and right facial droop. ? ?In the ED: ?Vitals are stable, renal function at 1.2 LDH of 445 ferritin of 1000 and hemoglobin of 10 platelet count of 34,000, MRI of the brain showed 5 small strokelike lesion measuring about 9 mm.  Some chronic cortical based infarct within the bilateral frontal temporal parietal and occipital lobe and advanced chronic small vessel disease it was discussed with the neurologist recommended transfer to [facility] ? ?Review of Systems: All systems reviewed and apart from history of presenting illness, are negative. ? ?Past Medical History:  ?Diagnosis Date  ? B12 deficiency   ? Bullous pemphigoid   ? Central retinal vein occlusion, left eye   ? Left eye blind  ? Cerebrovascular disease, unspecified   ? MRI indicates multiple small cortical infarcts.  ? Concussion   ? H/o fall with Skull fracture & concussion  ? DJD (degenerative joint disease), lumbar   ? Glaucoma, right eye   ? Hyperlipidemia   ? Hypertension   ? IFG (impaired fasting glucose)   ? Migraine headache   ? OSA (obstructive sleep apnea)   ? Intolerant of CPAP. Is supposed to wear nocturnal oxygen, but does not  ? Osteoarthritis of left knee   ? Osteopenia   ? Scoliosis deformity of spine   ? Seasonal allergies   ? Skull  fracture ([place])   ? Wrist fracture 2015  ? R wrist  ? ?Past Surgical History:  ?Procedure Laterality Date  ? ANKLE FRACTURE SURGERY Left   ? NM MYOVIEW LTD  10/2016  ? Only exercised 3: 34 min.  4.6 METs normal heart rate response.  Baseline hypertension.  Normal EF  65-70%.  No regional wall motion.  No ischemia or infarction.  Poor exercise tolerance.  No chronotropic incompetence.  ? OVARIAN CYST REMOVAL    ? VAGINAL HYSTERECTOMY    ? ?Social History:  reports that she has never smoked. She has never used smokeless tobacco. She reports current alcohol use. She reports that she does not use drugs. ? ? ?Allergies  ?Allergen Reactions  ? Penicillins Hives  ? ? ?Family History  ?Problem Relation Age of Onset  ? Lung cancer Father   ? Heart disease Father   ? Hypertension Father   ? COPD Father   ? Dementia Sister   ? Seizures Sister   ? Heart attack Sister 71  ?     Damage was to the back of her heart.  ? AAA (abdominal aortic aneurysm) Sister   ? Hypertension Daughter   ? Hypertension Sister   ? Diabetes type II Sister   ? Dementia Maternal Grandmother   ? ? ?Prior to Admission medications   ?Medication Sig Start Date End Date Taking? Authorizing Provider  ?acetaminophen (TYLENOL) 500 MG tablet Take 1,000 mg by mouth  in the morning, at noon, and at bedtime.    [provider]  ?Ascorbic Acid (VITAMIN C) 500 MG CHEW Chew 500 mg by mouth in the morning.    [provider]  ?Calcium Carbonate-Vit D-Min (CALCIUM 600+D3 PLUS MINERALS) 600-800 MG-UNIT TABS Take 1 tablet by mouth in the morning.    [provider]  ?clopidogrel (PLAVIX) 75 MG tablet Take 75 mg by mouth daily.    [provider]  ?EPINEPHrine 0.3 mg/0.3 mL IJ SOAJ injection Inject 0.3 mg into the muscle as needed for anaphylaxis.    [provider]  ?escitalopram (LEXAPRO) 5 MG tablet Take 5 mg by mouth daily.    [provider]  ?estrogens, conjugated, (PREMARIN) 0.3 MG tablet Take 0.3 mg by mouth daily.  Take daily for 21 days then do not take for 7 days.    [provider]  ?Evolocumab 140 MG/ML SOAJ Inject into the skin. Once A Day Every 14 Days    [provider]  ?ezetimibe (ZETIA) 10 MG tablet Take 10 mg by mouth daily.    [provider]  ?furosemide (LASIX) 20 MG tablet Take 20 mg by mouth. Once A Morning on Tue, Fri    [provider]  ?galantamine (RAZADYNE ER) 16 MG 24 hr capsule Take 16 mg by mouth daily. 04/03/21   [provider]  ?hydrALAZINE (APRESOLINE) 25 MG tablet Take 25 mg by mouth 2 (two) times daily.    [provider]  ?latanoprost (XALATAN) 0.005 % ophthalmic solution Place 1 drop into both eyes daily. 05/26/15   [provider]  ?levothyroxine (SYNTHROID) 50 MCG tablet Take 50 mcg by mouth. Once A Day on Sun, Mon, Tue, Wed, Thu, Fri    [provider]  ?levothyroxine (SYNTHROID, LEVOTHROID) 50 MCG tablet Take 2 tablets by mouth. On Saturday    [provider]  ?memantine (NAMENDA) 10 MG tablet Take 10 mg by mouth 2 (two) times daily.    [provider]  ?metoprolol succinate (TOPROL-XL) 50 MG 24 hr tablet Take 25 mg by mouth 2 (two) times daily.    [provider]  ?Multiple Vitamins-Minerals (ICAPS AREDS 2) CAPS Take 1 capsule by mouth 2 (two) times daily.    [provider]  ?olmesartan (BENICAR) 40 MG tablet Take 40 mg by mouth daily.    [provider]  ?pantoprazole (PROTONIX) 40 MG tablet Take 40 mg by mouth daily.    [provider]  ?Probiotic Product ([place]) CAPS Take 1 capsule by mouth daily.    [provider]  ?timolol (TIMOPTIC) 0.5 % ophthalmic solution Place 1 drop into both eyes 2 (two) times daily. 06/09/15   [provider]  ?traMADol (ULTRAM) 50 MG tablet Take 50 mg by mouth 3 (three) times daily as needed. 02/05/21   [provider]  ?vitamin B-12 (CYANOCOBALAMIN) 100 MCG tablet Take 100 mcg by mouth daily.     [provider]  ?zinc oxide 20 % ointment Apply 1 application topically as needed for irritation.    [provider]  ? ?Physical Exam: ?Vitals:  ? 05/14/21 1258 05/14/21 1330 05/14/21 1400  ?BP:  119/60 (!) 115/55  ?Pulse:  67 64  ?Resp:  19 11  ?SpO2:  95% 97%  ?Weight: 63.1 kg    ?Height: 5' 3.5" (1.613 m)    ? ? ?General exam: Moderately built and nourished patient, lying comfortably supine on the gurney in no obvious distress. ?  Head, eyes and ENT: Nontraumatic and normocephalic. Pupils equally reacting to light and accommodation. Oral mucosa moist. ?Neck: Supple. No JVD, carotid bruit or thyromegaly. ?Lymphatics: No lymphadenopathy. ?Respiratory system: Clear to auscultation. No increased work of breathing. ?Cardiovascular system: S1 and S2 heard, RRR. No JVD, murmurs, gallops, clicks or pedal edema. ?Gastrointestinal system: Abdomen is nondistended, soft and nontender. Normal bowel sounds heard. No organomegaly or masses appreciated. ?Central nervous system: Alert and oriented. No focal neurological deficits. ?Extremities: Symmetric 5 x 5 power. Peripheral pulses symmetrically felt.  ?Skin: No rashes or acute findings. ?Musculoskeletal system: Negative exam. ?Psychiatry: Pleasant and cooperative. ? ? ?Labs on Admission:  ?Basic Metabolic Panel: ?Recent Labs  ?Lab 05/12/21 ?1900 05/14/21 ?1310  ?NA 137 136  ?K 3.9 3.9  ?CL 103 105  ?CO2 23 23  ?GLUCOSE 112* 112*  ?BUN 13 17  ?CREATININE 1.04* 1.28*  ?CALCIUM 8.6* 8.3*  ? ?Liver Function Tests: ?Recent Labs  ?Lab 05/12/21 ?1900 05/14/21 ?1310  ?AST 21 21  ?ALT 13 14  ?ALKPHOS 172* 162*  ?BILITOT 0.3 0.6  ?PROT 6.8 7.2  ?ALBUMIN 3.3* 3.5  ? ?No results for input(s): LIPASE, AMYLASE in the last 168 hours. ?No results for input(s): AMMONIA in the last 168 hours. ?CBC: ?Recent Labs  ?Lab 05/12/21 ?1900 05/14/21 ?1310  ?WBC 6.0 7.1  ?NEUTROABS 1.7 1.6*  ?HGB 10.5* 10.7*  ?HCT 32.8* 33.0*  ?MCV 92.9 93.0  ?PLT 42* 34*  ? ?Cardiac Enzymes: ?No  results for input(s): CKTOTAL, CKMB, CKMBINDEX, TROPONINI in the last 168 hours. ? ?BNP (last 3 results) ?No results for input(s): PROBNP in the last 8760 hours. ?CBG: ?No results for input(s): GLUCAP in

## 2021-05-14 NOTE — Telephone Encounter (Signed)
Pt's daughter called to give an update that the Ed said the pt has had a stroke and her MRI showed something.  Pt's daughter stated that the pt is being admitted at [facility] due to these result.  Pt daughter is requesting that either Cira Rue, NP or Dr. Burr Medico give her a call. ?

## 2021-05-14 NOTE — Progress Notes (Addendum)
?[facility]   ?[phone] [phone]   ?Clinic New Consult Note  ? ?Patient Care Team: ?Perini, Mark, MD as PCP - General (Internal Medicine) ?Perini, Mark, MD (Internal Medicine) ?05/14/2021 ? ?CHIEF COMPLAINTS/PURPOSE OF CONSULTATION:  ?Worsening anemia and thrombocytopenia, referred by PCP Dr. Mark Perini ? ?HISTORY OF PRESENTING ILLNESS:  ?Deborah Tucker 88 y.o. female with past medical history including B12 deficiency, central retinal vein occlusion, glaucoma, HTN, HL, migraines, cerebrovascular disease, OSA, scoliosis, and multiple fractures is here because of worsening anemia and thrombocytopenia.  First available CBC 09/21/2006 from [facility] shows elevated white blood cell count 12.0 with increased neutrophils 10.1, hemoglobin and platelet count were normal.  She was being seen for autoimmune bullous pemphigoid at the time. No flare in at least 10 years. Next CBC with differential 04/29/2019 during ED visit for a fall was normal except mild thrombocytopenia platelet 134K. Routine labs at PCP 03/25/20 showed plt 121K, normal hgb and ANC. She presented back to ED 01/28/2021 after another fall at which time CBC showed mild normocytic anemia with Hgb 11.6, platelet 116 K.  CBC 05/04/21 showed plt 60K and hgb 10.2. She was seen for routine visit by PCP 05/12/2021 where she was found to have platelet count of 23K and was referred to ED; work-up showed hemoglobin 10.5, normal MCV, platelet 42K, monocytes 0.0, eosinophils 0.8, and elevated absolute immature granulocytes 0.5.  The peripheral smear showed mild left shift with 1-5% metas, occasional myelos and bands, no blasts.  CMP showed normal electrolytes, BG 112, SCR 1.04, alk phos 172. UA was negative. D dimer slightly elevated 3.41. CTA was negative for PE or acute abnormality. She was sent home with urgent heme consult.  ? ?Socially, [facility]  She has 2 healthy children.  She drinks 2  glasses of wine every night, denies tobacco or other drug use.  She was up-to-date on age-appropriate cancer screenings until she aged out, last colonoscopy approximately 5 years ago.  Denies any family history of cancer. ? ?Today she presents with her daughter [name] who provides most of the history/ROS.  [facility] she resides in assisted living.  Ambulates normally with a walker.  She was in her normal state of health until 05/08/2021 which she woke up with chest pain.  She was seen by the facility provider who felt this was MSK related and started Tylenol 3 times daily.  The chest pain resolved and her chronic back pain improved.  However since that day she has seemed more fatigued in bed more, low appetite with decreased p.o. intake, pale, and just not herself.  Denies fever, chills, cough, chest pain, dyspnea, leg edema, abdominal pain/bloating, nausea/vomiting, constipation, diarrhea, bleeding or bruising.  She has had falls in the past but none recently.  Denies recent infection.  Denies headache, vision change, unilateral weakness, slurred speech, or paresthesias. ? ? ? ?MEDICAL HISTORY:  ?Past Medical History:  ?Diagnosis Date  ? Anemia   ? B12 deficiency   ? Bullous pemphigoid   ? Central retinal vein occlusion, left eye   ? Left eye blind  ? Cerebrovascular disease, unspecified   ? MRI indicates multiple small cortical infarcts.  ? Concussion   ? H/o fall with Skull fracture & concussion  ? DJD (degenerative joint disease), lumbar   ? Glaucoma, right eye   ? Hyperlipidemia   ? Hypertension   ? IFG (impaired fasting glucose)   ?   Migraine headache   ? OSA (obstructive sleep apnea)   ? Intolerant of CPAP. Is supposed to wear nocturnal oxygen, but does not  ? Osteoarthritis of left knee   ? Osteopenia   ? Scoliosis deformity of spine   ? Seasonal allergies   ? Skull fracture ([place])   ? Wrist fracture 2015  ? R wrist  ? ? ?SURGICAL HISTORY: ?Past Surgical History:  ?Procedure  Laterality Date  ? ANKLE FRACTURE SURGERY Left   ? NM MYOVIEW LTD  10/2016  ? Only exercised 3: 34 min.  4.6 METs normal heart rate response.  Baseline hypertension.  Normal EF  65-70%.  No regional wall motion.  No ischemia or infarction.  Poor exercise tolerance.  No chronotropic incompetence.  ? OVARIAN CYST REMOVAL    ? VAGINAL HYSTERECTOMY    ? ? ?SOCIAL HISTORY: ?Social History  ? ?Socioeconomic History  ? Marital status: Widowed  ?  Spouse name: Not on file  ? Number of children: 2  ? Years of education: 17  ? Highest education level: Not on file  ?Occupational History  ? Occupation: retired.  ?Tobacco Use  ? Smoking status: Never  ? Smokeless tobacco: Never  ?Vaping Use  ? Vaping Use: Never used  ?Substance and Sexual Activity  ? Alcohol use: Not Currently  ?  Comment: 10 oz wine per night  ? Drug use: No  ? Sexual activity: Not Currently  ?Other Topics Concern  ? Not on file  ?Social History Narrative  ? ** Merged History Encounter **  ?    ? Widowed, then divorced and remarried in 2007 to [place] (-- he subsequently developed dementia) - now divorced from [place].  ?2 daughters - [name] (youngest, Lives in [place], [place]), [name] (older - lives in [place], [place]).  ?5 GC - (1 boy, 4 girls) ?1   ? yr college, Retired. ?Worked as a Training and development officer.  ?1-2 glasses of wine/day.  ? ?Social Determinants of Health  ? ?Financial Resource Strain: Not on file  ?Food Insecurity: Not on file  ?Transportation Needs: Not on file  ?Physical Activity: Not on file  ?Stress: Not on file  ?Social Connections: Not on file  ?Intimate Partner Violence: Not on file  ? ? ?FAMILY HISTORY: ?Family History  ?Problem Relation Age of Onset  ? Lung cancer Father   ? Heart disease Father   ? Hypertension Father   ? COPD Father   ? Dementia Sister   ? Seizures Sister   ? Heart attack Sister 20  ?     Damage was to the back of her heart.  ? AAA (abdominal aortic aneurysm) Sister   ? Hypertension Daughter   ? Hypertension Sister   ?  Diabetes type II Sister   ? Dementia Maternal Grandmother   ? ? ?ALLERGIES:  is allergic to penicillins. ? ?MEDICATIONS:  ?No current facility-administered medications for this visit.  ? ?Current Outpatient Medications  ?Medication Sig Dispense Refill  ? acetaminophen (TYLENOL) 500 MG tablet Take 1,000 mg by mouth in the morning, at noon, and at bedtime. And as needed for pain    ? Ascorbic Acid (VITAMIN C) 500 MG CHEW Chew 500 mg by mouth in the morning.    ? Calcium Carbonate-Vit D-Min (CALCIUM 600+D3 PLUS MINERALS) 600-800 MG-UNIT TABS Take 1 tablet by mouth in the morning.    ? clopidogrel (PLAVIX) 75 MG tablet Take 75 mg by mouth daily.    ? EPINEPHrine 0.3 mg/0.3  mL IJ SOAJ injection Inject 0.3 mg into the muscle once as needed for anaphylaxis.    ? escitalopram (LEXAPRO) 5 MG tablet Take 5 mg by mouth every evening.    ? estrogens, conjugated, (PREMARIN) 0.3 MG tablet Take 0.3 mg by mouth daily.    ? Evolocumab 140 MG/ML SOAJ Inject 140 mg into the skin every 14 (fourteen) days.    ? ezetimibe (ZETIA) 10 MG tablet Take 10 mg by mouth daily.    ? furosemide (LASIX) 20 MG tablet Take 20 mg by mouth See admin instructions. Take 20 mg once daily on Tue, Fri    ? galantamine (RAZADYNE ER) 16 MG 24 hr capsule Take 16 mg by mouth daily.    ? hydrALAZINE (APRESOLINE) 25 MG tablet Take 25 mg by mouth 2 (two) times daily.    ? latanoprost (XALATAN) 0.005 % ophthalmic solution Place 1 drop into both eyes at bedtime.    ? levothyroxine (SYNTHROID) 50 MCG tablet Take 50 mcg by mouth See admin instructions. Take 50 mcg on Sun, Mon, Tue, Wed, Thu, Fri and 100 mcg on Saturday.    ? memantine (NAMENDA) 10 MG tablet Take 10 mg by mouth 2 (two) times daily.    ? metoprolol succinate (TOPROL-XL) 50 MG 24 hr tablet Take 25 mg by mouth 2 (two) times daily.    ? Multiple Vitamins-Minerals (ICAPS AREDS 2) CAPS Take 1 capsule by mouth 2 (two) times daily.    ? olmesartan (BENICAR) 40 MG tablet Take 40 mg by mouth every evening.    ?  pantoprazole (PROTONIX) 40 MG tablet Take 40 mg by mouth daily.    ? Probiotic Product ([place]) CAPS Take 1 capsule by mouth daily.    ? timolol (TIMOPTIC) 0.5 % ophthalmic solution Place 1 drop int

## 2021-05-14 NOTE — Plan of Care (Addendum)
ON CALL PHONE CONSULT ? ?Call from DR. [email or web address] ? ?Discussion ?88 year old with concern for left-sided weakness with last known well somewhere 3 to 4 hours ago but has been fatigued all week, sent for MRI which shows DWI hyperintensities concerning for acute stroke with a lot of them having FLAIR correlates as well.  Not a candidate for emergent intravenous thrombolysis.  Based on description exam is not suggestive of LVO.  Has some sort of blood dyscrasia/malignancy for which she is being worked up.  MRI official read concerning for stroke but also could be mets.  Recommend getting MRI brain with contrast and a CT angio head and neck.  Ad[facility] to [facility] for stroke work-up.  Can give an ASA ?Neurology will assess the patient when she is at [facility]. ? ?Addendum ?Platelet count is 40.  Spoke with Dr. Tilden Fossa antiplatelets for now.  Decision on preventive medication after full assessment ? ?-- ?Amie Portland, MD ?Neurologist ?[facility] ?Pager: [phone] ? ?

## 2021-05-14 NOTE — ED Notes (Signed)
Patient transported to MRI 

## 2021-05-15 ENCOUNTER — Inpatient Hospital Stay (HOSPITAL_COMMUNITY): Payer: Medicare Other

## 2021-05-15 ENCOUNTER — Encounter (HOSPITAL_COMMUNITY): Payer: Self-pay | Admitting: Internal Medicine

## 2021-05-15 DIAGNOSIS — D61818 Other pancytopenia: Secondary | ICD-10-CM

## 2021-05-15 DIAGNOSIS — E785 Hyperlipidemia, unspecified: Secondary | ICD-10-CM | POA: Diagnosis not present

## 2021-05-15 DIAGNOSIS — I6389 Other cerebral infarction: Secondary | ICD-10-CM | POA: Diagnosis not present

## 2021-05-15 DIAGNOSIS — I639 Cerebral infarction, unspecified: Secondary | ICD-10-CM

## 2021-05-15 DIAGNOSIS — I63133 Cerebral infarction due to embolism of bilateral carotid arteries: Secondary | ICD-10-CM

## 2021-05-15 DIAGNOSIS — I10 Essential (primary) hypertension: Secondary | ICD-10-CM | POA: Diagnosis not present

## 2021-05-15 DIAGNOSIS — F32A Depression, unspecified: Secondary | ICD-10-CM | POA: Diagnosis present

## 2021-05-15 DIAGNOSIS — E039 Hypothyroidism, unspecified: Secondary | ICD-10-CM | POA: Diagnosis present

## 2021-05-15 DIAGNOSIS — D693 Immune thrombocytopenic purpura: Secondary | ICD-10-CM

## 2021-05-15 DIAGNOSIS — D696 Thrombocytopenia, unspecified: Secondary | ICD-10-CM | POA: Diagnosis not present

## 2021-05-15 LAB — CBC WITH DIFFERENTIAL/PLATELET
Abs Immature Granulocytes: 1.84 10*3/uL — ABNORMAL HIGH (ref 0.00–0.07)
Basophils Absolute: 0 10*3/uL (ref 0.0–0.1)
Basophils Relative: 1 %
Eosinophils Absolute: 0 10*3/uL (ref 0.0–0.5)
Eosinophils Relative: 0 %
HCT: 29.3 % — ABNORMAL LOW (ref 36.0–46.0)
Hemoglobin: 9.6 g/dL — ABNORMAL LOW (ref 12.0–15.0)
Immature Granulocytes: 32 %
Lymphocytes Relative: 31 %
Lymphs Abs: 1.8 10*3/uL (ref 0.7–4.0)
MCH: 30.2 pg (ref 26.0–34.0)
MCHC: 32.8 g/dL (ref 30.0–36.0)
MCV: 92.1 fL (ref 80.0–100.0)
Monocytes Absolute: 1.2 10*3/uL — ABNORMAL HIGH (ref 0.1–1.0)
Monocytes Relative: 21 %
Neutro Abs: 0.8 10*3/uL — ABNORMAL LOW (ref 1.7–7.7)
Neutrophils Relative %: 15 %
Platelets: 25 10*3/uL — CL (ref 150–400)
RBC: 3.18 MIL/uL — ABNORMAL LOW (ref 3.87–5.11)
RDW: 16 % — ABNORMAL HIGH (ref 11.5–15.5)
Smear Review: DECREASED
WBC: 5.7 10*3/uL (ref 4.0–10.5)
nRBC: 0 % (ref 0.0–0.2)

## 2021-05-15 LAB — COMPREHENSIVE METABOLIC PANEL
ALT: 13 U/L (ref 0–44)
AST: 17 U/L (ref 15–41)
Albumin: 3 g/dL — ABNORMAL LOW (ref 3.5–5.0)
Alkaline Phosphatase: 150 U/L — ABNORMAL HIGH (ref 38–126)
Anion gap: 10 (ref 5–15)
BUN: 11 mg/dL (ref 8–23)
CO2: 21 mmol/L — ABNORMAL LOW (ref 22–32)
Calcium: 8.2 mg/dL — ABNORMAL LOW (ref 8.9–10.3)
Chloride: 107 mmol/L (ref 98–111)
Creatinine, Ser: 1.05 mg/dL — ABNORMAL HIGH (ref 0.44–1.00)
GFR, Estimated: 51 mL/min — ABNORMAL LOW (ref >60)
Glucose, Bld: 119 mg/dL — ABNORMAL HIGH (ref 70–99)
Potassium: 3.3 mmol/L — ABNORMAL LOW (ref 3.5–5.1)
Sodium: 138 mmol/L (ref 135–145)
Total Bilirubin: 0.8 mg/dL (ref 0.3–1.2)
Total Protein: 6.2 g/dL — ABNORMAL LOW (ref 6.5–8.1)

## 2021-05-15 LAB — ABO/RH: ABO/RH(D): O POS

## 2021-05-15 LAB — LIPID PANEL
Cholesterol: 88 mg/dL (ref 0–200)
HDL: 50 mg/dL (ref >40)
LDL Cholesterol: 10 mg/dL (ref 0–99)
Total CHOL/HDL Ratio: 1.8 RATIO
Triglycerides: 139 mg/dL (ref <150)
VLDL: 28 mg/dL (ref 0–40)

## 2021-05-15 LAB — ECHOCARDIOGRAM COMPLETE
AR max vel: 1.76 cm2
AV Area VTI: 1.79 cm2
AV Area mean vel: 1.88 cm2
AV Mean grad: 5 mmHg
AV Peak grad: 8.6 mmHg
Ao pk vel: 1.47 m/s
Area-P 1/2: 2.84 cm2
Height: 63.5 in
MV VTI: 1.75 cm2
S' Lateral: 2.7 cm
Weight: 2227.21 oz

## 2021-05-15 LAB — DIRECT ANTIGLOBULIN TEST (NOT AT ARMC)
DAT, IgG: NEGATIVE
DAT, complement: NEGATIVE

## 2021-05-15 LAB — TECHNOLOGIST SMEAR REVIEW: Plt Morphology: DECREASED

## 2021-05-15 LAB — MAGNESIUM: Magnesium: 1.7 mg/dL (ref 1.7–2.4)

## 2021-05-15 LAB — VITAMIN B12: Vitamin B-12: 1859 pg/mL — ABNORMAL HIGH (ref 180–914)

## 2021-05-15 LAB — HEMOGLOBIN A1C
Hgb A1c MFr Bld: 6.8 % — ABNORMAL HIGH (ref 4.8–5.6)
Mean Plasma Glucose: 148.46 mg/dL

## 2021-05-15 LAB — TYPE AND SCREEN
ABO/RH(D): O POS
Antibody Screen: NEGATIVE

## 2021-05-15 LAB — ERYTHROPOIETIN: Erythropoietin: 152 m[IU]/mL — ABNORMAL HIGH (ref 2.6–18.5)

## 2021-05-15 LAB — PROTIME-INR
INR: 1.2 (ref 0.8–1.2)
Prothrombin Time: 14.9 seconds (ref 11.4–15.2)

## 2021-05-15 LAB — APTT: aPTT: 29 seconds (ref 24–36)

## 2021-05-15 LAB — FIBRINOGEN: Fibrinogen: 386 mg/dL (ref 210–475)

## 2021-05-15 LAB — HAPTOGLOBIN: Haptoglobin: 266 mg/dL (ref 41–333)

## 2021-05-15 MED ORDER — POTASSIUM CHLORIDE CRYS ER 20 MEQ PO TBCR
40.0000 meq | EXTENDED_RELEASE_TABLET | ORAL | Status: AC
  Administered 2021-05-15 (×2): 40 meq via ORAL
  Filled 2021-05-15 (×2): qty 2

## 2021-05-15 MED ORDER — IMMUNE GLOBULIN (HUMAN) 10 GM/100ML IV SOLN
400.0000 mg/kg | INTRAVENOUS | Status: DC
  Filled 2021-05-15: qty 250

## 2021-05-15 NOTE — Assessment & Plan Note (Signed)
Renal function stable at baseline. ?Monitor.  Avoid nephrotoxic medications. ?

## 2021-05-15 NOTE — Progress Notes (Signed)
Interventional Radiology Brief Note: ? ?Bone marrow biopsy requested in IR.  Unfortunately, cytology not available today.  Case has been scheduled in CT Monday at 8:30. ? ?Formal consult to follow.  ? ?Brynda Greathouse, MS RD PA-C ? ? ?

## 2021-05-15 NOTE — Progress Notes (Signed)
?  Progress Note ?Patient: Deborah Tucker VKP:[id number] DOB: 05-Sep-1930 DOA: 05/14/2021  ?DOS: the patient was seen and examined on 05/15/2021 ? ?Brief hospital course: ?PMH of HTN, CRAO, CVA, bicytopenia, B12 deficiency. ?Presents to the hematology office was found to have unilateral left-sided weakness. ?Identified to have acute CVA as well as worsening pancytopenia. ?Neurology and hematology consulted. ? ?Assessment and Plan: ?* Acute CVA (cerebrovascular accident) ([place]) ?Presents with left-sided weakness. ?CTA negative for any large vessel occlusion or significant stenosis. ?MRI positive for 5 cm stroke also concerning finding for marrow infiltrative process on the MRI. ?Echocardiogram shows preserved EF without any valvular or wall motion abnormality. ?Hemoglobin A1c 6.8.  LDL 10. ?Neurology consulted. ?Lower extremity Doppler negative for DVT. ?Currently not a candidate for anticoagulation or antiplatelet therapy.  Therefore no further work-up.  On Plavix but currently on hold due to severe thrombocytopenia. ?PT OT recommends home with home health. ? ?Pancytopenia ([place]) ?Hematology consulted. ?Seen hematology outpatient. ?Currently no evidence of TTP like picture. ?Further work-up initiated by hematology.  IR consulted for bone marrow biopsy which will be scheduled on Monday. ?Hypercoagulable work-up initiated. ?IVIG ordered by hematology for 2 days. ?Do not use steroids as it can impact morphology of the bone marrow biopsy. ?Platelet transfusion for platelet less than 20,000. ?Blood transfusion for hemoglobin less than 7.5. ?Monitor WBC. ? ?Chronic kidney disease, stage 3a ([place]) ?Renal function stable at baseline. ?Monitor.  Avoid nephrotoxic medications. ? ?Essential hypertension ?Blood pressure controlled.  On Toprol.  We will continue.  On Benicar which is currently on hold.  Also on hold hydralazine, Lasix. ? ?HLD (hyperlipidemia) ?On Zetia.  LDL is actually 10. ? ?Depression ?On Lexapro.   Continue. ? ?Hypothyroidism ?Continue Synthroid at 100-50 alternate today. ? ?Subjective: No nausea no vomiting no fever no chills no chest pain abdominal pain.  No headache.  No bleeding reported by the patient. ? ?Physical Exam: ?Vitals:  ? 05/14/21 2236 05/15/21 0324 05/15/21 0800 05/15/21 1218  ?BP: (!) 152/59 (!) 160/68 (!) 143/68 140/60  ?Pulse: 80 86 96 87  ?Resp: $Remov'16 18 18 18  'BTbWTn$ ?Temp: 98.5 ?F (36.9 ?C) 98.4 ?F (36.9 ?C) 98.1 ?F (36.7 ?C) 98.4 ?F (36.9 ?C)  ?TempSrc: Oral Oral Oral Oral  ?SpO2: 95% 94% 93% 94%  ?Weight:      ?Height:      ? ?General: Appear in mild distress; no visible Abnormal Neck Mass Or lumps, Conjunctiva normal ?Cardiovascular: S1 and S2 Present, no Murmur, ?Respiratory: good respiratory effort, Bilateral Air entry present and CTA, no Crackles, no wheezes ?Abdomen: Bowel Sound present, Non tender ?Extremities: no Pedal edema ?Neurology: alert and oriented to time, place, and person, some residual left-sided past-pointing ?Gait not checked due to patient safety concerns  ? ?Data Reviewed: ?I have Reviewed nursing notes, Vitals, and Lab results since pt's last encounter. Pertinent lab results CBC and CMP ?I have ordered test including CBC and CMP ?I have discussed pt's care plan and test results with hematology, neurology.  ? ?Family Communication: Family at bedside ? ?Disposition: ?Status is: Inpatient ?Remains inpatient appropriate because: Severe thrombocytopenia requiring close observation as well as may require transfusion.  Currently on IVIG therapy. ? ?Author: ?Berle Mull, MD ?05/15/2021 7:22 PM ? ?Please look on [email or web address] to find out who is on call. ?

## 2021-05-15 NOTE — Progress Notes (Signed)
?  Transition of Care (TOC) Screening Note ? ? ?Patient Details  ?Name: Deborah Tucker ?Date of Birth: 09/22/1930 ? ? ?Transition of Care [facility]) CM/SW Contact:    ?Geralynn Ochs, LCSW ?Phone Number: ?05/15/2021, 2:06 PM ? ? ? ?Transition of Care Department [facility]) has reviewed patient and no TOC needs have been identified at this time; medical workup ongoing. We will continue to monitor patient advancement through interdisciplinary progression rounds. If new patient transition needs arise, please place a TOC consult. ?  ?

## 2021-05-15 NOTE — Progress Notes (Addendum)
HEMATOLOGY-ONCOLOGY PROGRESS NOTE ? ?ASSESSMENT AND PLAN: ?1.  Pancytopenia, acute thrombocytopenia, mild anemia and new neutropenia ?2.  Acute CVA ?3.  CKD ?4.  Hypertension ?5.  Bullous pemphigoid ? ?-She has recent worsening of her thrombocytopenia and anemia.  She has had cytopenias for the past 3 months. ?-She has no evidence of iron deficiency or vitamin B12 deficiency.  Folate RBC pending.  She has no evidence of hemolysis.  Immature platelet fraction mildly elevated.  Multiple myeloma panel has been ordered and is pending. ?-Recommend proceeding with bone marrow biopsy to rule out primary bone marrow disease including myeloma, amyloidosis, MDS, lymphoma, etc.  This is tentatively planned for Monday. ?-Recommend PRBC transfusion for hemoglobin less than 7.5.  Recommend platelet transfusion for platelet count less than 20,000 or active bleeding. ?-Given significant thrombocytopenia, would hold on anticoagulation or antiplatelet therapy.  ?-Neurology following for stroke work-up. ? ?Mikey Bussing, DNP, AGPCNP-BC, AOCNP ? ?SUBJECTIVE: ?[name] was seen for initial consult in our office yesterday for thrombocytopenia and anemia.  She was having significant fatigue in our office yesterday as well as decreased p.o. intake.  According to her daughter, she was just not herself.  At the conclusion of the visit, she was noted to have left foot drag and altered gait.  She was transported to the emergency department for evaluation.  MRI of the brain showed 5 foci of restricted diffusion scattered within the supratentorial brain measuring up to 9 mm compatible with acute infarcts.  On the MRI, she was noted to have hypointense marrow signal in the calvarium and upper cervical spine which could be related to her chronic anemia and a marrow infiltrative process is in the differential.  She has been admitted for stroke work-up.  From a hematological standpoint, her peripheral blood smear showed no schistocytes.  Her LDH  was elevated but reticulocyte count was normal and her T. bili was normal.  There was no evidence of hemolysis.  TTP was ruled out.  A bone marrow biopsy has been recommended and planned for Monday. ? ?Today, the patient has no specific complaints.  She has no bleeding.  Seems to have some mild forgetfulness on exam today but otherwise no specific neurological changes. ? ?REVIEW OF SYSTEMS:   ?Review of Systems  ?Constitutional:  Positive for malaise/fatigue. Negative for fever.  ?HENT: Negative.    ?Respiratory: Negative.    ?Cardiovascular: Negative.   ?Neurological: Negative.   ? ?I have reviewed the past medical history, past surgical history, social history and family history with the patient and they are unchanged from previous note. ? ? ?PHYSICAL EXAMINATION: ? ?Vitals:  ? 05/15/21 0324 05/15/21 0800  ?BP: (!) 160/68 (!) 143/68  ?Pulse: 86 96  ?Resp: 18 18  ?Temp: 98.4 ?F (36.9 ?C) 98.1 ?F (36.7 ?C)  ?SpO2: 94% 93%  ? ?Filed Weights  ? 05/14/21 1258  ?Weight: 63.1 kg  ? ? ?Intake/Output from previous day: ?No intake/output data recorded. ? ?Physical Exam ?Vitals reviewed.  ?Constitutional:   ?   General: She is not in acute distress. ?HENT:  ?   Head: Normocephalic.  ?Eyes:  ?   General: No scleral icterus. ?   Conjunctiva/sclera: Conjunctivae normal.  ?Cardiovascular:  ?   Rate and Rhythm: Normal rate.  ?Pulmonary:  ?   Effort: Pulmonary effort is normal. No respiratory distress.  ?Abdominal:  ?   General: Bowel sounds are normal.  ?   Palpations: Abdomen is soft.  ?   Tenderness: There is no  abdominal tenderness.  ?Skin: ?   General: Skin is warm and dry.  ?Neurological:  ?   Mental Status: She is alert and oriented to person, place, and time.  ? ? ?LABORATORY DATA:  ?I have reviewed the data as listed ? ?  Latest Ref Rng & Units 05/14/2021  ?  1:10 PM 05/12/2021  ?  7:00 PM 01/28/2021  ? 11:55 PM  ?CMP  ?Glucose 70 - 99 mg/dL 112   112   100    ?BUN 8 - 23 mg/dL _0 ?Creatinine 0.44 - 1.00 mg/dL  1.28   1.04   0.94    ?Sodium 135 - 145 mmol/L 136   137   134    ?Potassium 3.5 - 5.1 mmol/L 3.9   3.9   4.0    ?Chloride 98 - 111 mmol/L 105   103   103    ?CO2 22 - 32 mmol/L _1 ?Calcium 8.9 - 10.3 mg/dL 8.3   8.6   8.7    ?Total Protein 6.5 - 8.1 g/dL 7.2   6.8   6.6    ?Total Bilirubin 0.3 - 1.2 mg/dL 0.6   0.3   <0.1    ?Alkaline Phos 38 - 126 U/L 162   172   76    ?AST 15 - 41 U/L _2 ?ALT 0 - 44 U/L _3 ? ? ?Lab Results  ?Component Value Date  ? WBC 7.1 05/14/2021  ? HGB 10.7 (L) 05/14/2021  ? HCT 33.0 (L) 05/14/2021  ? MCV 93.0 05/14/2021  ? PLT 34 (L) 05/14/2021  ? NEUTROABS 1.6 (L) 05/14/2021  ? ? ?No results found for: CEA1, CEA, [id number], CA125, PSA1 ? ?CT ANGIO HEAD NECK W WO CM ? ?Result Date: 05/14/2021 ?CLINICAL DATA:  Acute neuro deficit.  Stroke. EXAM: CT ANGIOGRAPHY HEAD AND NECK TECHNIQUE: Multidetector CT imaging of the head and neck was performed using the standard protocol during bolus administration of intravenous contrast. Multiplanar CT image reconstructions and MIPs were obtained to evaluate the vascular anatomy. Carotid stenosis measurements (when applicable) are obtained utilizing NASCET criteria, using the distal internal carotid diameter as the denominator. RADIATION DOSE REDUCTION: This exam was performed according to the departmental dose-optimization program which includes automated exposure control, adjustment of the mA and/or kV according to patient size and/or use of iterative reconstruction technique. CONTRAST:  1m OMNIPAQUE IOHEXOL 350 MG/ML SOLN COMPARISON:  MRI head 05/14/2021. FINDINGS: CT HEAD FINDINGS Brain: Generalized atrophy. Moderate white matter hypodensity bilaterally consistent with chronic microvascular ischemia. Small areas of restricted diffusion are present in the cerebral white matter bilaterally and in the right occipital lobe. These are difficult to identified by CT. No acute hemorrhage or mass. Vascular: Negative for  hyperdense vessel Skull: Negative Sinuses: Complete opacification left sphenoid sinus. Possible mucocele. Possible mild expansion. Remaining sinuses clear. Orbits: Negative Review of the MIP images confirms the above findings CTA NECK FINDINGS Aortic arch: Standard branching. Imaged portion shows no evidence of aneurysm or dissection. No significant stenosis of the major arch vessel origins. Right carotid system: Minimal atherosclerotic disease right carotid bifurcation. Negative for stenosis. Mild fibromuscular dysplasia right internal carotid artery without dissection or stenosis. Left carotid system: Mild atherosclerotic disease left carotid bifurcation without stenosis. Mild fibromuscular dysplasia left internal carotid artery without dissection.  Approximately 50% diameter stenosis of the left internal carotid artery due to FMD. Vertebral arteries: Both vertebral arteries are normal and widely patent. Skeleton: Cervical spondylosis.  No acute skeletal abnormality. Other neck: Negative for mass or adenopathy in the neck. Upper chest: Lung apices clear bilaterally. Review of the MIP images confirms the above findings CTA HEAD FINDINGS Anterior circulation: Atherosclerotic calcification throughout the cavernous carotid bilaterally. Mild stenosis bilaterally. Anterior and middle cerebral arteries patent bilaterally without stenosis or large vessel occlusion. Negative for aneurysm. Posterior circulation: Atherosclerotic calcification and mild stenosis of the vertebral artery bilaterally at the foramen magnum. Both vertebral arteries contribute to the basilar. PICA patent bilaterally. Basilar widely patent. Superior cerebellar and posterior cerebral arteries patent bilaterally without stenosis or large vessel occlusion. Venous sinuses: Normal venous enhancement Anatomic variants: None Review of the MIP images confirms the above findings IMPRESSION: 1. Negative for intracranial large vessel occlusion or significant  stenosis. 2. Mild atherosclerotic disease the carotid bifurcation bilaterally without stenosis. 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 No significant stenosis on the right. Approxim

## 2021-05-15 NOTE — Hospital Course (Addendum)
PMH of HTN, CRAO, CVA, bicytopenia, B12 deficiency. ?Presents to the hematology office was found to have unilateral left-sided weakness. ?Identified to have acute CVA as well as worsening pancytopenia. ?Neurology and hematology consulted. 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 ?

## 2021-05-15 NOTE — Evaluation (Signed)
Physical Therapy Evaluation ?Patient Details ?Name: Deborah Tucker ?MRN: [id number] ?DOB: 10-May-1930 ?Today's Date: 05/15/2021 ? ?History of Present Illness ? Pt is an 88 y.o. female who presented to the ED from cancer center with concerns of dragging L foot during gait. MRI revealed multifocal small strokes in separate vascular territories. PMH:  anemia, B12 deficiency, CRVO OS, glaucoma OD, HLD, HTN, CKD, migraines, OA, OSA, prior fall with skull fx and concussion ?  ?Clinical Impression ? Pt admitted with above diagnosis. PTA pt resided at [facility]F, mod I mobility with rollator, reporting multi falls in last 6 months. Pt currently with functional limitations due to the deficits listed below (see PT Problem List). On eval, pt required supervision transfers and ambulation 25' with RW. Pt declining further gait distance due to just returning to bed. No foot drag noted on L. BLE strength appears symmetrical. Pt will benefit from skilled PT to increase their independence and safety with mobility to allow discharge to the venue listed below.   ?   ?   ? ?Recommendations for follow up therapy are one component of a multi-disciplinary discharge planning process, led by the attending physician.  Recommendations may be updated based on patient status, additional functional criteria and insurance authorization. ? ?Follow Up Recommendations Home health PT ? ?  ?Assistance Recommended at Discharge PRN  ?Patient can return home with the following ?   ? ?  ?Equipment Recommendations None recommended by PT  ?Recommendations for Other Services ?    ?  ?Functional Status Assessment Patient has had a recent decline in their functional status and demonstrates the ability to make significant improvements in function in a reasonable and predictable amount of time.  ? ?  ?Precautions / Restrictions Precautions ?Precautions: Fall  ? ?  ? ?Mobility ? Bed Mobility ?Overal bed mobility: Modified Independent ?  ?  ?  ?  ?  ?   ?General bed mobility comments: HOB elevated ?  ? ?Transfers ?Overall transfer level: Needs assistance ?Equipment used: Rolling walker (2 wheels) ?Transfers: Sit to/from Stand ?Sit to Stand: Supervision ?  ?  ?  ?  ?  ?General transfer comment: supervision for safety ?  ? ?Ambulation/Gait ?Ambulation/Gait assistance: Supervision ?Gait Distance (Feet): 25 Feet ?Assistive device: Rolling walker (2 wheels) ?Gait Pattern/deviations: Step-through pattern, Decreased stride length ?Gait velocity: WFL ?Gait velocity interpretation: >2.62 ft/sec, indicative of community ambulatory ?  ?General Gait Details: Pt declining further gait distance due to returning to bed just prior to therapist's arrival. No foot drag noted on L. ? ?Stairs ?  ?  ?  ?  ?  ? ?Wheelchair Mobility ?  ? ?Modified Rankin (Stroke Patients Only) ?Modified Rankin (Stroke Patients Only) ?Pre-Morbid Rankin Score: No significant disability ?Modified Rankin: Moderate disability ? ?  ? ?Balance Overall balance assessment: Needs assistance ?Sitting-balance support: No upper extremity supported, Feet supported ?Sitting balance-Leahy Scale: Good ?  ?  ?Standing balance support: During functional activity, Bilateral upper extremity supported, Reliant on assistive device for balance ?Standing balance-Leahy Scale: Poor ?  ?  ?  ?  ?  ?  ?  ?  ?  ?  ?  ?  ?   ? ? ? ?Pertinent Vitals/Pain Pain Assessment ?Pain Assessment: No/denies pain  ? ? ?Home Living Family/patient expects to be discharged to:: Assisted living ?  ?  ?  ?  ?  ?  ?  ?  ?Home Equipment: Rollator (4 wheels);Shower seat - built in;Hand held shower  head;Grab bars - tub/shower;Grab bars - toilet ?Additional Comments: Pt resides at [facility]. She moved from independent living to assisted living in Nov 2022.  ?  ?Prior Function Prior Level of Function : Needs assist ?  ?  ?  ?Physical Assist : ADLs (physical) ?  ?ADLs (physical): Bathing;IADLs ?Mobility Comments: mod I mobility with rollator.  Reports multiple falls in last 6 months. ?ADLs Comments: Supervision for in/out of shower. ALF staffs assists with meds, meals, housekeeping. ?  ? ? ?Hand Dominance  ?   ? ?  ?Extremity/Trunk Assessment  ? Upper Extremity Assessment ?Upper Extremity Assessment: Defer to OT evaluation ?  ? ?Lower Extremity Assessment ?Lower Extremity Assessment: Overall WFL for tasks assessed (symmetrical) ?  ? ?Cervical / Trunk Assessment ?Cervical / Trunk Assessment: Kyphotic  ?Communication  ? Communication: No difficulties  ?Cognition Arousal/Alertness: Awake/alert ?Behavior During Therapy: [facility] for tasks assessed/performed ?Overall Cognitive Status: History of cognitive impairments - at baseline ?  ?  ?  ?  ?  ?  ?  ?  ?  ?  ?  ?  ?  ?  ?  ?  ?General Comments: Pt reports memory deficits at baseline. A&O x 3. Appropriate. Following commands. ?  ?  ? ?  ?General Comments   ? ?  ?Exercises    ? ?Assessment/Plan  ?  ?PT Assessment Patient needs continued PT services  ?PT Problem List Decreased mobility;Decreased knowledge of precautions;Decreased activity tolerance;Decreased balance ? ?   ?  ?PT Treatment Interventions Therapeutic activities;Gait training;Therapeutic exercise;Patient/family education;Balance training;Functional mobility training   ? ?PT Goals (Current goals can be found in the Care Plan section)  ?Acute Rehab PT Goals ?Patient Stated Goal: return to [facility] ?PT Goal Formulation: With patient/family ?Time For Goal Achievement: 05/29/21 ?Potential to Achieve Goals: Good ? ?  ?Frequency Min 3X/week ?  ? ? ?Co-evaluation   ?  ?  ?  ?  ? ? ?  ?AM-PAC PT "6 Clicks" Mobility  ?Outcome Measure Help needed turning from your back to your side while in a flat bed without using bedrails?: None ?Help needed moving from lying on your back to sitting on the side of a flat bed without using bedrails?: None ?Help needed moving to and from a bed to a chair (including a wheelchair)?: A Little ?Help needed standing up from a chair  using your arms (e.g., wheelchair or bedside chair)?: A Little ?Help needed to walk in hospital room?: A Little ?Help needed climbing 3-5 steps with a railing? : A Lot ?6 Click Score: 19 ? ?  ?End of Session Equipment Utilized During Treatment: Gait belt ?Activity Tolerance: Patient tolerated treatment well ?Patient left: in bed;with call bell/phone within reach;with bed alarm set;with family/visitor present ?Nurse Communication: Mobility status ?PT Visit Diagnosis: Other abnormalities of gait and mobility (R26.89) ?  ? ?Time: 2595-6387 ?PT Time Calculation (min) (ACUTE ONLY): 12 min ? ? ?Charges:   PT Evaluation ?$PT Eval Moderate Complexity: 1 Mod ?  ?  ?   ? ? ?Lorrin Goodell, PT  ?Office # [phone] ?Pager [phone] ? ? ?Lorriane Shire ?05/15/2021, 8:57 AM ? ?

## 2021-05-15 NOTE — Assessment & Plan Note (Signed)
On Lexapro.  Continue. ?

## 2021-05-15 NOTE — Progress Notes (Addendum)
STROKE TEAM PROGRESS NOTE  ? ?INTERVAL HISTORY ?Her daughter is at the bedside.  Patient was initially sent from Dr. Silvestre Mesi office for abnormal labs D-dimer 4.4, plt 23, hgb 10.2 -> 9.3 on 5/2. On 5/4 she was sent from the cancer center because she was dragging her foot while she was walking which is abnormal for her.  ?Plt 40-> 25 this morning, antiplatelet medications are currently being held. Bone marrow biopsy scheduled for Monday at 0830.  ? ?Vitals:  ? 05/14/21 2236 05/15/21 0324 05/15/21 0800 05/15/21 1218  ?BP: (!) 152/59 (!) 160/68 (!) 143/68 140/60  ?Pulse: 80 86 96 87  ?Resp: 16 18 18 18   ?Temp: 98.5 ?F (36.9 ?C) 98.4 ?F (36.9 ?C) 98.1 ?F (36.7 ?C) 98.4 ?F (36.9 ?C)  ?TempSrc: Oral Oral Oral Oral  ?SpO2: 95% 94% 93% 94%  ?Weight:      ?Height:      ? ?CBC:  ?Recent Labs  ?Lab 05/14/21 ?1310 05/15/21 ?0843  ?WBC 7.1 5.7  ?NEUTROABS 1.6* 0.8*  ?HGB 10.7* 9.6*  ?HCT 33.0* 29.3*  ?MCV 93.0 92.1  ?PLT 34* 25*  ? ?Basic Metabolic Panel:  ?Recent Labs  ?Lab 05/14/21 ?1310 05/15/21 ?0843  ?NA 136 138  ?K 3.9 3.3*  ?CL 105 107  ?CO2 23 21*  ?GLUCOSE 112* 119*  ?BUN 17 11  ?CREATININE 1.28* 1.05*  ?CALCIUM 8.3* 8.2*  ?MG  --  1.7  ? ?Lipid Panel:  ?Recent Labs  ?Lab 05/15/21 ?0412  ?CHOL 88  ?TRIG 139  ?HDL 50  ?CHOLHDL 1.8  ?VLDL 28  ?[place] 10  ? ?HgbA1c:  ?Recent Labs  ?Lab 05/15/21 ?0412  ?HGBA1C 6.8*  ? ?Urine Drug Screen: No results for input(s): LABOPIA, COCAINSCRNUR, LABBENZ, AMPHETMU, THCU, LABBARB in the last 168 hours.  ?Alcohol Level No results for input(s): ETH in the last 168 hours. ? ?IMAGING past 24 hours ?CT ANGIO HEAD NECK W WO CM ? ?Result Date: 05/14/2021 ?CLINICAL DATA:  Acute neuro deficit.  Stroke. EXAM: CT ANGIOGRAPHY HEAD AND NECK TECHNIQUE: Multidetector CT imaging of the head and neck was performed using the standard protocol during bolus administration of intravenous contrast. Multiplanar CT image reconstructions and MIPs were obtained to evaluate the vascular anatomy. Carotid stenosis  measurements (when applicable) are obtained utilizing NASCET criteria, using the distal internal carotid diameter as the denominator. RADIATION DOSE REDUCTION: This exam was performed according to the departmental dose-optimization program which includes automated exposure control, adjustment of the mA and/or kV according to patient size and/or use of iterative reconstruction technique. CONTRAST:  9m OMNIPAQUE IOHEXOL 350 MG/ML SOLN COMPARISON:  MRI head 05/14/2021. FINDINGS: CT HEAD FINDINGS Brain: Generalized atrophy. Moderate white matter hypodensity bilaterally consistent with chronic microvascular ischemia. Small areas of restricted diffusion are present in the cerebral white matter bilaterally and in the right occipital lobe. These are difficult to identified by CT. No acute hemorrhage or mass. Vascular: Negative for hyperdense vessel Skull: Negative Sinuses: Complete opacification left sphenoid sinus. Possible mucocele. Possible mild expansion. Remaining sinuses clear. Orbits: Negative Review of the MIP images confirms the above findings CTA NECK FINDINGS Aortic arch: Standard branching. Imaged portion shows no evidence of aneurysm or dissection. No significant stenosis of the major arch vessel origins. Right carotid system: Minimal atherosclerotic disease right carotid bifurcation. Negative for stenosis. Mild fibromuscular dysplasia right internal carotid artery without dissection or stenosis. Left carotid system: Mild atherosclerotic disease left carotid bifurcation without stenosis. Mild fibromuscular dysplasia left internal carotid artery without dissection. Approximately 50%  diameter stenosis of the left internal carotid artery due to FMD. Vertebral arteries: Both vertebral arteries are normal and widely patent. Skeleton: Cervical spondylosis.  No acute skeletal abnormality. Other neck: Negative for mass or adenopathy in the neck. Upper chest: Lung apices clear bilaterally. Review of the MIP images  confirms the above findings CTA HEAD FINDINGS Anterior circulation: Atherosclerotic calcification throughout the cavernous carotid bilaterally. Mild stenosis bilaterally. Anterior and middle cerebral arteries patent bilaterally without stenosis or large vessel occlusion. Negative for aneurysm. Posterior circulation: Atherosclerotic calcification and mild stenosis of the vertebral artery bilaterally at the foramen magnum. Both vertebral arteries contribute to the basilar. PICA patent bilaterally. Basilar widely patent. Superior cerebellar and posterior cerebral arteries patent bilaterally without stenosis or large vessel occlusion. Venous sinuses: Normal venous enhancement Anatomic variants: None Review of the MIP images confirms the above findings IMPRESSION: 1. Negative for intracranial large vessel occlusion or significant stenosis. 2. Mild atherosclerotic disease the carotid bifurcation bilaterally without stenosis. 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 No significant stenosis on the right. Approximately 50% diameter stenosis left internal carotid artery due to FMD. 3. Both vertebral arteries widely patent. 4. CT head demonstrates microvascular angiopathy. Small acute infarcts best seen on MRI. No acute hemorrhage. Electronically Signed   By: Franchot Gallo M.D.   On: 05/14/2021 18:13  ? ?US Abdomen Complete ? ?Result Date: 05/15/2021 ?CLINICAL DATA:  Splenomegaly. EXAM: ABDOMEN ULTRASOUND COMPLETE COMPARISON:  August 12, 2006. FINDINGS: Gallbladder: Possible adenomyomatosis of the gallbladder wall. No gallstones or wall thickening visualized. No sonographic Murphy sign noted by sonographer. Common bile duct: Diameter: 4 mm which is within normal limits. Liver: No focal lesion identified. Within normal limits in parenchymal echogenicity. Portal vein is patent on color Doppler imaging with normal direction of blood flow towards the liver. IVC: No abnormality visualized. Pancreas: 9 mm  cystic lesion is seen in the posterior portion of the pancreatic head. No ductal dilatation is noted. Spleen: Size and appearance within normal limits. Right Kidney: Length: 10.3 cm. Echogenicity within normal limits. No mass or hydronephrosis visualized. Left Kidney: Length: 10.8 cm. Echogenicity within normal limits. No mass or hydronephrosis visualized. Abdominal aorta: No aneurysm visualized. Other findings: None. IMPRESSION: 9 mm cystic abnormality seen in posterior portion of pancreatic head. No ductal dilatation is noted. When the patient is clinically stable and able to follow directions and hold their breath (preferably as an outpatient) further evaluation with dedicated abdominal MRI should be considered. Possible adenomyomatosis of gallbladder wall. No cholelithiasis or cholecystitis is noted. Electronically Signed   By: Marijo Conception M.D.   On: 05/15/2021 16:17  ? ?ECHOCARDIOGRAM COMPLETE ? ?Result Date: 05/15/2021 ?   ECHOCARDIOGRAM REPORT   Patient Name:   REGGIE WELGE Date of Exam: 05/15/2021 Medical Rec #:  [id number]       Height:       63.5 in Accession #:    [id number]      Weight:       139.2 lb Date of Birth:  Jul 25, 1930       BSA:          1.667 m? Patient Age:    88 years        BP:           140/60 mmHg Patient Gender: F               HR:           82 bpm. Exam Location:  Inpatient Procedure: 2D  Echo, Cardiac Doppler and Color Doppler Indications:    Stroke  History:        Patient has no prior history of Echocardiogram examinations.                 Risk Factors:Hypertension, Family History of Coronary Artery                 Disease and Dyslipidemia. Stroke. CKD.  Sonographer:    Clayton Lefort RDCS (AE) Referring Phys: [place]  1. Left ventricular ejection fraction, by estimation, is 60 to 65%. The left ventricle has normal function. The left ventricle has no regional wall motion abnormalities. There is severe left ventricular hypertrophy. Left ventricular diastolic  parameters  are consistent with Grade I diastolic dysfunction (impaired relaxation).  2. Right ventricular systolic function is normal. The right ventricular size is normal. Tricuspid regurgitation signal i

## 2021-05-15 NOTE — Evaluation (Signed)
Speech Language Pathology Evaluation ?Patient Details ?Name: Deborah Tucker ?MRN: [id number] ?DOB: March 03, 1930 ?Today's Date: 05/15/2021 ?Time: 9323-5573 ?SLP Time Calculation (min) (ACUTE ONLY): 22 min ? ?Problem List:  ?Patient Active Problem List  ? Diagnosis Date Noted  ? Embolic stroke ([place]) 22/02/5425  ? Thrombocytopenia ([place]) 05/14/2021  ? Normocytic anemia 05/14/2021  ? CKD (chronic kidney disease), stage III ([place]) 05/14/2021  ? Atypical chest pain 10/08/2016  ? Fatigue 10/05/2016  ? Dyspnea on exertion 10/05/2016  ? Family history of coronary artery disease 10/05/2016  ? Essential hypertension 10/05/2016  ? Dyslipidemia, goal to be determined 10/05/2016  ? Exercise intolerance 10/05/2016  ? ?Past Medical History:  ?Past Medical History:  ?Diagnosis Date  ? Anemia   ? B12 deficiency   ? Bullous pemphigoid   ? Central retinal vein occlusion, left eye   ? Left eye blind  ? Cerebrovascular disease, unspecified   ? MRI indicates multiple small cortical infarcts.  ? Concussion   ? H/o fall with Skull fracture & concussion  ? DJD (degenerative joint disease), lumbar   ? Glaucoma, right eye   ? Hyperlipidemia   ? Hypertension   ? IFG (impaired fasting glucose)   ? Migraine headache   ? OSA (obstructive sleep apnea)   ? Intolerant of CPAP. Is supposed to wear nocturnal oxygen, but does not  ? Osteoarthritis of left knee   ? Osteopenia   ? Scoliosis deformity of spine   ? Seasonal allergies   ? Skull fracture ([place])   ? Wrist fracture 2015  ? R wrist  ? ?Past Surgical History:  ?Past Surgical History:  ?Procedure Laterality Date  ? ANKLE FRACTURE SURGERY Left   ? NM MYOVIEW LTD  10/2016  ? Only exercised 3: 34 min.  4.6 METs normal heart rate response.  Baseline hypertension.  Normal EF  65-70%.  No regional wall motion.  No ischemia or infarction.  Poor exercise tolerance.  No chronotropic incompetence.  ? OVARIAN CYST REMOVAL    ? VAGINAL HYSTERECTOMY    ? ?HPI:  ?88yo female admitted 05/14/21 with left foot weakness.  PMH: essential HTN, BN12 deficiency, central renal artery occlusion, CVA, anemia, thrombocytopenia, OSA. MRI = chronic cortical based infarct within the bilateral frontal temporal, parietal, and occipital lobe, advanced chronic small vessel disease  ? ?Assessment / Plan / Recommendation ?Clinical Impression ? Pt was seen for evaluation of cognitive linguistic function. Pt reports living in ALF, and her daughter manages finances. Pt reports completing one year of college. Pts speech is fully intelligible, receptive and expressive language are intact. The Mini Mental State Examination (MMSE) was administered today. Pt scored 29/30, indicating performance within functional limits. One point lost for stating current season as "Fall". No further ST intervention is recommended at this time. Pt/daughter were encouraged to notify PCP if difficulties arise upon return to routines. ST signing off. ?   ?SLP Assessment ? SLP Recommendation/Assessment: All further Speech Language Pathology needs can be addressed in the next venue of care if needs arise. ? ?SLP Visit Diagnosis: Cognitive communication deficit (R41.841)  ?  ?Recommendations for follow up therapy are one component of a multi-disciplinary discharge planning process, led by the attending physician.  Recommendations may be updated based on patient status, additional functional criteria and insurance authorization. ?   ?Follow Up Recommendations ? No SLP follow up  ?  ?Assistance Recommended at Discharge ? None  ?Functional Status Assessment Patient has not had a recent decline in their functional status  ?   ?  SLP Evaluation ?Cognition ? Overall Cognitive Status: History of cognitive impairments - at baseline. Daughter Deborah Tucker reports pt is on 2 medications for memory ?Arousal/Alertness: Awake/alert ?Orientation Level: Oriented X4 ?Year: 2023 ?Month: May ?Day of Week: Correct  ?  ?   ?Comprehension ? Auditory Comprehension ?Overall Auditory Comprehension: Appears within  functional limits for tasks assessed ?Reading Comprehension ?Reading Status: Within funtional limits  ?  ?Expression Expression ?Primary Mode of Expression: Verbal ?Verbal Expression ?Overall Verbal Expression: Appears within functional limits for tasks assessed ?Written Expression ?Dominant Hand: Right ?Written Expression: Within Functional Limits   ?Oral / Motor ? Oral Motor/Sensory Function ?Overall Oral Motor/Sensory Function: Within functional limits ?Motor Speech ?Overall Motor Speech: Appears within functional limits for tasks assessed ?Intelligibility: Intelligible   ?        ?Deborah Tucker B. Deborah Tucker, MSP, CCC-SLP ?Speech Language Pathologist ?Office: [phone] ? ?Deborah Tucker ?05/15/2021, 11:18 AM ? ?

## 2021-05-15 NOTE — Evaluation (Signed)
Occupational Therapy Evaluation ?Patient Details ?Name: Ersilia Brawley ?MRN: [id number] ?DOB: 03/03/1930 ?Today's Date: 05/15/2021 ? ? ?History of Present Illness 88 y.o. female who presented to the ED from cancer center with concerns of dragging L foot during gait. MRI revealed multifocal small strokes in separate vascular territories. PMH:  anemia, B12 deficiency, CRVO OS, glaucoma OD, HLD, HTN, CKD, migraines, OA, OSA, prior fall with skull fx and concussion  ? ?Clinical Impression ?  ?PTA, pt was living at [facility] were she received supervision for shower transfer, assistance for IADLs, and use of RW. Pt currently requiring Min Guard A for ADLs and functional mobility with RW. Pt presenting with decreased activity tolerance and fatigues quickly. Pt would benefit from further acute OT to facilitate safe dc. Recommend dc to home with HHOT for further OT to optimize safety, independence with ADLs, and return to PLOF.  ?   ? ?Recommendations for follow up therapy are one component of a multi-disciplinary discharge planning process, led by the attending physician.  Recommendations may be updated based on patient status, additional functional criteria and insurance authorization.  ? ?Follow Up Recommendations ? Home health OT  ?  ?Assistance Recommended at Discharge PRN  ?Patient can return home with the following A little help with walking and/or transfers;A little help with bathing/dressing/bathroom ? ?  ?Functional Status Assessment ? Patient has had a recent decline in their functional status and demonstrates the ability to make significant improvements in function in a reasonable and predictable amount of time.  ?Equipment Recommendations ? None recommended by OT  ?  ?Recommendations for Other Services   ? ? ?  ?Precautions / Restrictions Precautions ?Precautions: Fall ?Restrictions ?Weight Bearing Restrictions: No  ? ?  ? ?Mobility Bed Mobility ?Overal bed mobility: Modified Independent ?  ?  ?  ?  ?  ?   ?General bed mobility comments: HOB elevated ?  ? ?Transfers ?Overall transfer level: Needs assistance ?Equipment used: Rolling walker (2 wheels) ?Transfers: Sit to/from Stand ?Sit to Stand: Min guard ?  ?  ?  ?  ?  ?General transfer comment: Min Guard A for safety ?  ? ?  ?Balance Overall balance assessment: Needs assistance ?Sitting-balance support: No upper extremity supported, Feet supported ?Sitting balance-Leahy Scale: Good ?  ?  ?Standing balance support: During functional activity, Bilateral upper extremity supported, Reliant on assistive device for balance ?Standing balance-Leahy Scale: Poor ?  ?  ?  ?  ?  ?  ?  ?  ?  ?  ?  ?  ?   ? ?ADL either performed or assessed with clinical judgement  ? ?ADL Overall ADL's : Needs assistance/impaired ?Eating/Feeding: Set up;Sitting ?  ?Grooming: Min guard;Sitting ?  ?Upper Body Bathing: Min guard;Sitting ?  ?Lower Body Bathing: Sit to/from stand;Min guard ?  ?Upper Body Dressing : Min guard;Sitting ?  ?Lower Body Dressing: Min guard;Sit to/from stand ?Lower Body Dressing Details (indicate cue type and reason): adjusting socks ?Toilet Transfer: Min guard;Ambulation;Rolling walker (2 wheels) (simulated to recliner) ?  ?  ?  ?  ?  ?Functional mobility during ADLs: Min guard;Rolling walker (2 wheels) ?General ADL Comments: Pt performing mobility to sink - pt fatigued and requesting to return to bed before performing oral care - min guard over all  ? ? ? ?Vision   ?   ?   ?Perception   ?  ?Praxis   ?  ? ?Pertinent Vitals/Pain Pain Assessment ?Pain Assessment: No/denies pain  ? ? ? ?  Hand Dominance Right ?  ?Extremity/Trunk Assessment Upper Extremity Assessment ?Upper Extremity Assessment: Overall WFL for tasks assessed ?  ?Lower Extremity Assessment ?Lower Extremity Assessment: Defer to PT evaluation ?  ?Cervical / Trunk Assessment ?Cervical / Trunk Assessment: Kyphotic ?  ?Communication Communication ?Communication: No difficulties ?  ?Cognition Arousal/Alertness:  Awake/alert ?Behavior During Therapy: [facility] for tasks assessed/performed ?Overall Cognitive Status: History of cognitive impairments - at baseline ?  ?  ?  ?  ?  ?  ?  ?  ?  ?  ?  ?  ?  ?  ?  ?  ?General Comments: Pt reports memory deficits at baseline. A&O x 3. Appropriate. Following commands. ?  ?  ?General Comments  Daughter present ? ?  ?Exercises   ?  ?Shoulder Instructions    ? ? ?Home Living Family/patient expects to be discharged to:: Assisted living ?  ?Available Help at Discharge: Family ?Type of Home: Assisted living ?  ?  ?  ?  ?  ?  ?  ?  ?  ?  ?  ?Home Equipment: Rollator (4 wheels);Shower seat - built in;Hand held shower head;Grab bars - tub/shower;Grab bars - toilet ?  ?Additional Comments: Pt resides at [facility]. She moved from independent living to assisted living in Nov 2022. ? Lives With: Alone ? ?  ?Prior Functioning/Environment Prior Level of Function : Needs assist ?  ?  ?  ?Physical Assist : ADLs (physical) ?  ?ADLs (physical): Bathing;IADLs ?Mobility Comments: mod I mobility with rollator. Reports multiple falls in last 6 months. ?ADLs Comments: Supervision for in/out of shower. ALF staffs assists with meds, meals, housekeeping. ?  ? ?  ?  ?OT Problem List: Decreased strength;Decreased range of motion;Decreased activity tolerance ?  ?   ?OT Treatment/Interventions: Self-care/ADL training;Therapeutic exercise;Energy conservation;DME and/or AE instruction  ?  ?OT Goals(Current goals can be found in the care plan section) Acute Rehab OT Goals ?Patient Stated Goal: Go home ?OT Goal Formulation: With patient ?Time For Goal Achievement: 05/29/21 ?Potential to Achieve Goals: Good  ?OT Frequency: Min 2X/week ?  ? ?Co-evaluation   ?  ?  ?  ?  ? ?  ?AM-PAC OT "6 Clicks" Daily Activity     ?Outcome Measure Help from another person eating meals?: A Little ?Help from another person taking care of personal grooming?: A Little ?Help from another person toileting, which includes using toliet, bedpan,  or urinal?: A Little ?Help from another person bathing (including washing, rinsing, drying)?: A Little ?Help from another person to put on and taking off regular upper body clothing?: A Little ?Help from another person to put on and taking off regular lower body clothing?: A Little ?6 Click Score: 18 ?  ?End of Session Equipment Utilized During Treatment: Rolling walker (2 wheels) ?Nurse Communication: Mobility status ? ?Activity Tolerance: Patient tolerated treatment well ?Patient left: in bed;with call bell/phone within reach;with bed alarm set;with family/visitor present ? ?OT Visit Diagnosis: Unsteadiness on feet (R26.81);Other abnormalities of gait and mobility (R26.89);Muscle weakness (generalized) (M62.81)  ?              ?Time: 1034-1050 ?OT Time Calculation (min): 16 min ?Charges:  OT General Charges ?$OT Visit: 1 Visit ?OT Evaluation ?$OT Eval Moderate Complexity: 1 Mod ? ?Bruna Dills MSOT, OTR/L ?Acute Rehab ?Pager: [phone] ?Office: [phone] ? ?Atlantis Delong M Shaarav Ripple ?05/15/2021, 3:53 PM ?

## 2021-05-15 NOTE — Assessment & Plan Note (Addendum)
On Zetia.  LDL is actually 10.  Also on Repatha.  Continue. ?

## 2021-05-15 NOTE — Consult Note (Signed)
?                    NEURO HOSPITALIST CONSULT NOTE  ? ?Requesting physician: Dr. Posey Pronto ? ?Reason for Consult: Multifocal acute small ischemic infarctions on MRI brain ? ?History obtained from:  Patient, Daughter and Chart    ? ?HPI:                                                                                                                                         ? Deborah Tucker is an 88 y.o. female with a PMHx of anemia, B12 deficiency, CRVO OS, glaucoma OD, HLD, HTN, CKD, migraines, OSA, prior fall with skull fx and concussion and osteoarthritis who initially presented to the [facility] ED after daughter noted her to be dragging her left leg while ambulating. MRI was obtained, revealing multifocal small strokes in separate vascular territories. Platelet count was low at 34. LDH was elevated. She has been seen by Heme/Onc to further assess her thrombocytopenia with normocytic anemia, with DDx more favoring lymphoma than leukemia. Bone marrow biopsy is being considered. Plavix has been held due to the thrombocytopenia.  ? ?Past Medical History:  ?Diagnosis Date  ? Anemia   ? B12 deficiency   ? Bullous pemphigoid   ? Central retinal vein occlusion, left eye   ? Left eye blind  ? Cerebrovascular disease, unspecified   ? MRI indicates multiple small cortical infarcts.  ? Concussion   ? H/o fall with Skull fracture & concussion  ? DJD (degenerative joint disease), lumbar   ? Glaucoma, right eye   ? Hyperlipidemia   ? Hypertension   ? IFG (impaired fasting glucose)   ? Migraine headache   ? OSA (obstructive sleep apnea)   ? Intolerant of CPAP. Is supposed to wear nocturnal oxygen, but does not  ? Osteoarthritis of left knee   ? Osteopenia   ? Scoliosis deformity of spine   ? Seasonal allergies   ? Skull fracture ([place])   ? Wrist fracture 2015  ? R wrist  ? ? ?Past Surgical History:  ?Procedure Laterality Date  ? ANKLE FRACTURE SURGERY Left   ? NM MYOVIEW LTD  10/2016  ? Only exercised 3: 34 min.  4.6 METs normal heart rate  response.  Baseline hypertension.  Normal EF  65-70%.  No regional wall motion.  No ischemia or infarction.  Poor exercise tolerance.  No chronotropic incompetence.  ? OVARIAN CYST REMOVAL    ? VAGINAL HYSTERECTOMY    ? ? ?Family History  ?Problem Relation Age of Onset  ? Lung cancer Father   ? Heart disease Father   ? Hypertension Father   ? COPD Father   ? Dementia Sister   ? Seizures Sister   ? Heart attack Sister 11  ?     Damage was to the back of her heart.  ? AAA (abdominal  aortic aneurysm) Sister   ? Hypertension Daughter   ? Hypertension Sister   ? Diabetes type II Sister   ? Dementia Maternal Grandmother   ?         ? ?Social History:  reports that she has never smoked. She has never used smokeless tobacco. She reports that she does not currently use alcohol. She reports that she does not use drugs. ? ?Allergies  ?Allergen Reactions  ? Penicillins Hives  ? ? ?MEDICATIONS:                                                                                                                     ?Prior to Admission:  ?Medications Prior to Admission  ?Medication Sig Dispense Refill Last Dose  ? acetaminophen (TYLENOL) 500 MG tablet Take 1,000 mg by mouth in the morning, at noon, and at bedtime. And as needed for pain   05/14/2021  ? Ascorbic Acid (VITAMIN C) 500 MG CHEW Chew 500 mg by mouth in the morning.   05/14/2021  ? Calcium Carbonate-Vit D-Min (CALCIUM 600+D3 PLUS MINERALS) 600-800 MG-UNIT TABS Take 1 tablet by mouth in the morning.   05/14/2021  ? clopidogrel (PLAVIX) 75 MG tablet Take 75 mg by mouth daily.   05/14/2021 at 9.45 am  ? EPINEPHrine 0.3 mg/0.3 mL IJ SOAJ injection Inject 0.3 mg into the muscle once as needed for anaphylaxis.   unk last dose  ? escitalopram (LEXAPRO) 5 MG tablet Take 5 mg by mouth every evening.   05/13/2021  ? estrogens, conjugated, (PREMARIN) 0.3 MG tablet Take 0.3 mg by mouth daily.   05/14/2021  ? Evolocumab 140 MG/ML SOAJ Inject 140 mg into the skin every 14 (fourteen) days.   05/13/2021  ?  ezetimibe (ZETIA) 10 MG tablet Take 10 mg by mouth daily.   05/14/2021  ? furosemide (LASIX) 20 MG tablet Take 20 mg by mouth See admin instructions. Take 20 mg once daily on Tue, Fri   05/12/2021  ? galantamine (RAZADYNE ER) 16 MG 24 hr capsule Take 16 mg by mouth daily.   05/14/2021  ? hydrALAZINE (APRESOLINE) 25 MG tablet Take 25 mg by mouth 2 (two) times daily.   05/14/2021  ? latanoprost (XALATAN) 0.005 % ophthalmic solution Place 1 drop into both eyes at bedtime.   05/13/2021  ? levothyroxine (SYNTHROID) 50 MCG tablet Take 50 mcg by mouth See admin instructions. Take 50 mcg on Sun, Mon, Tue, Wed, Thu, Fri and 100 mcg on Saturday.   05/13/2021  ? melatonin 3 MG TABS tablet Take 3 mg by mouth at bedtime.   05/13/2021  ? memantine (NAMENDA) 10 MG tablet Take 10 mg by mouth 2 (two) times daily.   05/14/2021  ? metoprolol succinate (TOPROL-XL) 50 MG 24 hr tablet Take 25 mg by mouth 2 (two) times daily.   05/14/2021 at 9.45 am  ? Multiple Vitamins-Minerals (ICAPS AREDS 2) CAPS Take 1 capsule by mouth 2 (two) times daily.   05/14/2021  ? olmesartan (BENICAR)  40 MG tablet Take 40 mg by mouth every evening.   05/13/2021  ? pantoprazole (PROTONIX) 40 MG tablet Take 40 mg by mouth daily.   05/14/2021  ? Probiotic Product ([place]) CAPS Take 1 capsule by mouth daily.   05/14/2021  ? timolol (TIMOPTIC) 0.5 % ophthalmic solution Place 1 drop into both eyes 2 (two) times daily.   05/14/2021  ? traMADol (ULTRAM) 50 MG tablet Take 50 mg by mouth 3 (three) times daily as needed for severe pain.   05/08/2021  ? vitamin B-12 (CYANOCOBALAMIN) 1000 MCG tablet Take 1,000 mcg by mouth daily.   05/14/2021  ? zinc oxide 20 % ointment Apply 1 application. topically daily as needed for irritation. To buttocks after every incontinent episode and as needed.   unk last dose  ? ?Scheduled: ? escitalopram  5 mg Oral Daily  ? ezetimibe  10 mg Oral Daily  ? galantamine  16 mg Oral Daily  ? latanoprost  1 drop Both Eyes Daily  ? [START ON 05/16/2021] levothyroxine   100 mcg Oral Once per day on Sat  ? levothyroxine  50 mcg Oral Once per day on Sun Mon Tue Wed Thu Fri  ? memantine  10 mg Oral BID  ? metoprolol succinate  25 mg Oral BID  ? pantoprazole  40 mg Oral Daily  ? senna-docusate  1 tablet Oral QHS  ? timolol  1 drop Both Eyes BID  ? ?Continuous: ? sodium chloride 50 mL/hr at 05/14/21 2310  ? ? ? ?ROS:                                                                                                                                       ?No CP, SOB, trouble talking, or vision changes. Denies any facial weakness despite right facial droop seen on exam. Other ROS as per HPI.  ? ? ?Blood pressure (!) 160/68, pulse 86, temperature 98.4 ?F (36.9 ?C), temperature source Oral, resp. rate 18, height 5' 3.5" (1.613 m), weight 63.1 kg, SpO2 94 %. ? ? ?General Examination:                                                                                                      ? ?Physical Exam  ?HEENT-  [place]/AT    ?Lungs- Respirations unlabored ?Extremities- Warm and well perfused ? ? ?Neurological Examination ?Mental Status: Awake and alert. Oriented x 5. Speech fluent with intact naming and comprehension. No dysarthria.  ?Cranial Nerves: ?  II: Temporal visual fields intact with no extinction to DSS.  ?III,IV, VI: No ptosis. EOMI. No nystagmus.   ?V: Temp sensation equal bilaterally  ?VII: Mild right facial droop.  ?VIII: Hearing intact to voice ?IX,X: No hypophonia or hoarseness ?XI: Symmetric  ?XII: Midline tongue extension ?Motor: ?RUE with subtle lag relative to the left in initiating movement. Otherwise 5/5 without drift.  ?LUE 5/5 proximally and distally ?RLE 5/5 proximally and distally ?LLE 5/5 proximally and distally ?Sensory: Decreased temp sensation to RUE; temp sensation normal to LUE and BLE.  FT intact x 4. No extinction to DSS.  ?Deep Tendon Reflexes: 1+ right brachioradialis, 2+ left brachioradialis, 1+ bilateral patellae, 0 bilateral achilles ?Cerebellar: No ataxia with FNF  bilaterally ?Gait: Deferred ?  ?Lab Results: ?Basic Metabolic Panel: ?Recent Labs  ?Lab 05/12/21 ?1900 05/14/21 ?1310  ?NA 137 136  ?K 3.9 3.9  ?CL 103 105  ?CO2 23 23  ?GLUCOSE 112* 112*  ?BUN 13 1

## 2021-05-15 NOTE — Assessment & Plan Note (Addendum)
Hematology consulted. Patient treated with IVIG and has received 2 units of platelets via transfusion. Bone marrow biopsy consistent with AM. Hypercoagulable workup unremarkable. Platelets of 22,000 prior to admission. No evidence of bleeding. Recommendation for outpatient transfusion as recommended by outpatient oncologist. ?

## 2021-05-15 NOTE — Progress Notes (Signed)
?  Echocardiogram ?2D Echocardiogram has been performed. ? ?Deborah Tucker ?05/15/2021, 1:48 PM ?

## 2021-05-15 NOTE — Consult Note (Signed)
? ?Chief Complaint: ?Patient was seen in consultation today for thrombocytopenia ? ?Referring Physician(s): ?Dr. Burr Medico ? ?Supervising Physician: Arne Cleveland ? ?Patient Status: [facility] - In-pt ? ?History of Present Illness: ?Deborah Tucker is a 88 y.o. female with past medical history of thrombocytopenia and anemia.  She presented to Heme/Onc for an outpatient appointment 5/4 and was noted to be fatigued, weak, and "not herself" per her daughter.  She was referred to the ED for admission and worked up for acute stroke.  She has continue simultaneous Heme and Neuro work- up while admitted.  Request made for bone marrow biopsy by Dr. Burr Medico.  ? ?Patient assessed at bedside.  She is stable without complaint.  Spoke with daughter at bedside who provides consent.  ? ?Past Medical History:  ?Diagnosis Date  ? Anemia   ? B12 deficiency   ? Bullous pemphigoid   ? Central retinal vein occlusion, left eye   ? Left eye blind  ? Cerebrovascular disease, unspecified   ? MRI indicates multiple small cortical infarcts.  ? Concussion   ? H/o fall with Skull fracture & concussion  ? DJD (degenerative joint disease), lumbar   ? Glaucoma, right eye   ? Hyperlipidemia   ? Hypertension   ? IFG (impaired fasting glucose)   ? Migraine headache   ? OSA (obstructive sleep apnea)   ? Intolerant of CPAP. Is supposed to wear nocturnal oxygen, but does not  ? Osteoarthritis of left knee   ? Osteopenia   ? Scoliosis deformity of spine   ? Seasonal allergies   ? Skull fracture ([place])   ? Wrist fracture 2015  ? R wrist  ? ? ?Past Surgical History:  ?Procedure Laterality Date  ? ANKLE FRACTURE SURGERY Left   ? NM MYOVIEW LTD  10/2016  ? Only exercised 3: 34 min.  4.6 METs normal heart rate response.  Baseline hypertension.  Normal EF  65-70%.  No regional wall motion.  No ischemia or infarction.  Poor exercise tolerance.  No chronotropic incompetence.  ? OVARIAN CYST REMOVAL    ? VAGINAL HYSTERECTOMY     ? ? ?Allergies: ?Penicillins ? ?Medications: ?Prior to Admission medications   ?Medication Sig Start Date End Date Taking? Authorizing Provider  ?acetaminophen (TYLENOL) 500 MG tablet Take 1,000 mg by mouth in the morning, at noon, and at bedtime. And as needed for pain   Yes [provider]  ?Ascorbic Acid (VITAMIN C) 500 MG CHEW Chew 500 mg by mouth in the morning.   Yes [provider]  ?Calcium Carbonate-Vit D-Min (CALCIUM 600+D3 PLUS MINERALS) 600-800 MG-UNIT TABS Take 1 tablet by mouth in the morning.   Yes [provider]  ?clopidogrel (PLAVIX) 75 MG tablet Take 75 mg by mouth daily.   Yes [provider]  ?EPINEPHrine 0.3 mg/0.3 mL IJ SOAJ injection Inject 0.3 mg into the muscle once as needed for anaphylaxis.   Yes [provider]  ?escitalopram (LEXAPRO) 5 MG tablet Take 5 mg by mouth every evening.   Yes [provider]  ?estrogens, conjugated, (PREMARIN) 0.3 MG tablet Take 0.3 mg by mouth daily.   Yes [provider]  ?Evolocumab 140 MG/ML SOAJ Inject 140 mg into the skin every 14 (fourteen) days.   Yes [provider]  ?ezetimibe (ZETIA) 10 MG tablet Take 10 mg by mouth daily.   Yes [provider]  ?furosemide (LASIX) 20 MG tablet Take 20 mg by mouth See admin instructions. Take 20 mg once daily  on Tue, Fri   Yes [provider]  ?galantamine (RAZADYNE ER) 16 MG 24 hr capsule Take 16 mg by mouth daily. 04/03/21  Yes [provider]  ?hydrALAZINE (APRESOLINE) 25 MG tablet Take 25 mg by mouth 2 (two) times daily.   Yes [provider]  ?latanoprost (XALATAN) 0.005 % ophthalmic solution Place 1 drop into both eyes at bedtime. 05/26/15  Yes [provider]  ?levothyroxine (SYNTHROID) 50 MCG tablet Take 50 mcg by mouth See admin instructions. Take 50 mcg on Sun, Mon, Tue, Wed, Thu, Fri and 100 mcg on Saturday.   Yes [provider]  ?melatonin 3 MG TABS tablet Take 3 mg by mouth at  bedtime.   Yes [provider]  ?memantine (NAMENDA) 10 MG tablet Take 10 mg by mouth 2 (two) times daily.   Yes [provider]  ?metoprolol succinate (TOPROL-XL) 50 MG 24 hr tablet Take 25 mg by mouth 2 (two) times daily.   Yes [provider]  ?Multiple Vitamins-Minerals (ICAPS AREDS 2) CAPS Take 1 capsule by mouth 2 (two) times daily.   Yes [provider]  ?olmesartan (BENICAR) 40 MG tablet Take 40 mg by mouth every evening.   Yes [provider]  ?pantoprazole (PROTONIX) 40 MG tablet Take 40 mg by mouth daily.   Yes [provider]  ?Probiotic Product ([place]) CAPS Take 1 capsule by mouth daily.   Yes [provider]  ?timolol (TIMOPTIC) 0.5 % ophthalmic solution Place 1 drop into both eyes 2 (two) times daily. 06/09/15  Yes [provider]  ?traMADol (ULTRAM) 50 MG tablet Take 50 mg by mouth 3 (three) times daily as needed for severe pain. 02/05/21  Yes [provider]  ?vitamin B-12 (CYANOCOBALAMIN) 1000 MCG tablet Take 1,000 mcg by mouth daily.   Yes [provider]  ?zinc oxide 20 % ointment Apply 1 application. topically daily as needed for irritation. To buttocks after every incontinent episode and as needed.   Yes [provider]  ?  ? ?Family History  ?Problem Relation Age of Onset  ? Lung cancer Father   ? Heart disease Father   ? Hypertension Father   ? COPD Father   ? Dementia Sister   ? Seizures Sister   ? Heart attack Sister 54  ?     Damage was to the back of her heart.  ? AAA (abdominal aortic aneurysm) Sister   ? Hypertension Daughter   ? Hypertension Sister   ? Diabetes type II Sister   ? Dementia Maternal Grandmother   ? ? ?Social History  ? ?Socioeconomic History  ? Marital status: Widowed  ?  Spouse name: Not on file  ? Number of children: 2  ? Years of education: 20  ? Highest education level: Not on file  ?Occupational History  ? Occupation: retired.  ?Tobacco Use  ? Smoking  status: Never  ? Smokeless tobacco: Never  ?Vaping Use  ? Vaping Use: Never used  ?Substance and Sexual Activity  ? Alcohol use: Not Currently  ?  Comment: 10 oz wine per night  ? Drug use: No  ? Sexual activity: Not Currently  ?Other Topics Concern  ? Not on file  ?Social History Narrative  ? ** Merged History Encounter **  ?    ? Widowed, then divorced and remarried in 2007 to [place] (-- he subsequently developed dementia) - now divorced from [place].  ?2 daughters - [name] (youngest, Lives in [place],  [place]), [name] (older - lives in [place], [place]).  ?5 GC - (1 boy, 4 girls) ?1   ? yr college, Retired. ?Worked as a Training and development officer.  ?1-2 glasses of wine/day.  ? ?Social Determinants of Health  ? ?Financial Resource Strain: Not on file  ?Food Insecurity: Not on file  ?Transportation Needs: Not on file  ?Physical Activity: Not on file  ?Stress: Not on file  ?Social Connections: Not on file  ? ? ? ?Review of Systems: A 12 point ROS discussed and pertinent positives are indicated in the HPI above.  All other systems are negative. ? ?Review of Systems  ?Constitutional:  Negative for fatigue and fever.  ?Respiratory:  Negative for cough and shortness of breath.   ?Cardiovascular:  Negative for chest pain.  ?Gastrointestinal:  Negative for abdominal pain, nausea and vomiting.  ?Musculoskeletal:  Negative for back pain.  ?Psychiatric/Behavioral:  Negative for behavioral problems and confusion.   ? ?Vital Signs: ?BP 140/60 (BP Location: Left Arm)   Pulse 87   Temp 98.4 ?F (36.9 ?C) (Oral)   Resp 18   Ht 5' 3.5" (1.613 m)   Wt 139 lb 3.2 oz (63.1 kg)   SpO2 94%   BMI 24.27 kg/m?  ? ?Physical Exam ?Vitals and nursing note reviewed.  ?Constitutional:   ?   General: She is not in acute distress. ?   Appearance: Normal appearance. She is not ill-appearing.  ?Cardiovascular:  ?   Rate and Rhythm: Normal rate and regular rhythm.  ?Pulmonary:  ?   Effort: Pulmonary effort is normal. No respiratory distress.  ?    Breath sounds: Normal breath sounds.  ?Abdominal:  ?   General: Abdomen is flat. There is no distension.  ?   Palpations: Abdomen is soft.  ?Skin: ?   General: Skin is warm and dry.  ?Neurological:  ?   General: No focal deficit present.  ?   Mental Status: She i

## 2021-05-15 NOTE — Assessment & Plan Note (Addendum)
Continue Synthroid as ordered. ?

## 2021-05-15 NOTE — Progress Notes (Signed)
Lower extremity venous duplex has been completed.  ? ?Preliminary results in CV Proc.  ? ?Lucresia Simic Tinika Bucknam ?05/15/2021 1:27 PM    ?

## 2021-05-15 NOTE — Assessment & Plan Note (Addendum)
Patient presented with left sided weakness. MRI confirms multiple acute infarcts located in a scattered pattern within the supratentorial brain. CTA head and neck negative for large vessel occlusion or significant stenosis. LDL of 10. Hemoglobin A1C of 6.8%. Transthoracic Echocardiogram significant for no thrombus or mention of atrial level shunt. Neurology recommendations for no antithrombotic regimen secondary to severe thrombocytopenia. PT/OT recommendations for home health therapy. Patient discharged on no new medications. Recommendation for Neurology follow-up in 4 weeks. ?

## 2021-05-15 NOTE — Assessment & Plan Note (Addendum)
Blood pressure controlled.   ?On Toprol. We will continue.   ?On Benicar, hydralazine, Lasix currently on hold. ?

## 2021-05-16 DIAGNOSIS — I639 Cerebral infarction, unspecified: Secondary | ICD-10-CM

## 2021-05-16 LAB — BASIC METABOLIC PANEL
Anion gap: 7 (ref 5–15)
BUN: 8 mg/dL (ref 8–23)
CO2: 21 mmol/L — ABNORMAL LOW (ref 22–32)
Calcium: 8.3 mg/dL — ABNORMAL LOW (ref 8.9–10.3)
Chloride: 111 mmol/L (ref 98–111)
Creatinine, Ser: 0.94 mg/dL (ref 0.44–1.00)
GFR, Estimated: 58 mL/min — ABNORMAL LOW (ref >60)
Glucose, Bld: 102 mg/dL — ABNORMAL HIGH (ref 70–99)
Potassium: 4 mmol/L (ref 3.5–5.1)
Sodium: 139 mmol/L (ref 135–145)

## 2021-05-16 LAB — CBC
HCT: 26.2 % — ABNORMAL LOW (ref 36.0–46.0)
Hemoglobin: 8.5 g/dL — ABNORMAL LOW (ref 12.0–15.0)
MCH: 29.8 pg (ref 26.0–34.0)
MCHC: 32.4 g/dL (ref 30.0–36.0)
MCV: 91.9 fL (ref 80.0–100.0)
Platelets: 21 10*3/uL — CL (ref 150–400)
RBC: 2.85 MIL/uL — ABNORMAL LOW (ref 3.87–5.11)
RDW: 15.9 % — ABNORMAL HIGH (ref 11.5–15.5)
WBC: 7.6 10*3/uL (ref 4.0–10.5)
nRBC: 0.3 % — ABNORMAL HIGH (ref 0.0–0.2)

## 2021-05-16 MED ORDER — IMMUNE GLOBULIN (HUMAN) 5 GM/50ML IV SOLN
25.0000 g | INTRAVENOUS | Status: AC
  Administered 2021-05-16 – 2021-05-17 (×2): 25 g via INTRAVENOUS
  Filled 2021-05-16 (×3): qty 50

## 2021-05-16 NOTE — Plan of Care (Signed)
°  Problem: Education: °Goal: Knowledge of disease or condition will improve °Outcome: Progressing °Goal: Knowledge of secondary prevention will improve (SELECT ALL) °Outcome: Progressing °Goal: Knowledge of patient specific risk factors will improve (INDIVIDUALIZE FOR PATIENT) °Outcome: Progressing °Goal: Individualized Educational Video(s) °Outcome: Progressing °  °Problem: Coping: °Goal: Will verbalize positive feelings about self °Outcome: Progressing °Goal: Will identify appropriate support needs °Outcome: Progressing °  °Problem: Health Behavior/Discharge Planning: °Goal: Ability to manage health-related needs will improve °Outcome: Progressing °  °Problem: Self-Care: °Goal: Ability to participate in self-care as condition permits will improve °Outcome: Progressing °Goal: Verbalization of feelings and concerns over difficulty with self-care will improve °Outcome: Progressing °Goal: Ability to communicate needs accurately will improve °Outcome: Progressing °  °Problem: Nutrition: °Goal: Risk of aspiration will decrease °Outcome: Progressing °Goal: Dietary intake will improve °Outcome: Progressing °  °Problem: Ischemic Stroke/TIA Tissue Perfusion: °Goal: Complications of ischemic stroke/TIA will be minimized °Outcome: Progressing °  °

## 2021-05-16 NOTE — Progress Notes (Signed)
?  Progress Note ?Patient: Deborah Tucker BTD:[id number] DOB: 1930/08/18 DOA: 05/14/2021  ?DOS: the patient was seen and examined on 05/16/2021 ? ?Brief hospital course: ?PMH of HTN, CRAO, CVA, bicytopenia, B12 deficiency. ?Presents to the hematology office was found to have unilateral left-sided weakness. ?Identified to have acute CVA as well as worsening pancytopenia. ?Neurology and hematology consulted. ?Assessment and Plan: ?* Acute CVA (cerebrovascular accident) ([place]) ?Presents with left-sided weakness. ?CTA negative for any large vessel occlusion or significant stenosis. ?MRI positive for 5 cm stroke also concerning finding for marrow infiltrative process on the MRI. ?Echocardiogram shows preserved EF without any valvular or wall motion abnormality. ?Hemoglobin A1c 6.8.  LDL 10. ?Neurology consulted. ?Lower extremity Doppler negative for DVT. ?Currently not a candidate for anticoagulation or antiplatelet therapy.  Neurology initiated hypercoagulable work-up. ?On Plavix but currently on hold due to severe thrombocytopenia. ?PT OT recommends home with home health. ? ?Pancytopenia ([place]) ?Hematology consulted. ?Seen hematology outpatient. ?Currently no evidence of TTP like picture. ?Further work-up initiated by hematology.  IR consulted for bone marrow biopsy which will be scheduled on Monday. ?Hypercoagulable work-up initiated. ?IVIG ordered by hematology for 2 days. ?Not using steroids as it can impact morphology of the bone marrow biopsy. ?Platelet transfusion for platelet less than 20,000. ?Blood transfusion for hemoglobin less than 7.5. ? ?Chronic kidney disease, stage 3a ([place]) ?Renal function stable at baseline. ?Monitor.  Avoid nephrotoxic medications. ? ?Essential hypertension ?Blood pressure controlled.  On Toprol.  We will continue.  On Benicar which is currently on hold.  Also on hold hydralazine, Lasix. ? ?HLD (hyperlipidemia) ?On Zetia.  LDL is actually 10.  Also on Repatha.  Continue. ? ?Depression ?On  Lexapro.  Continue. ? ?Hypothyroidism ?Continue Synthroid as ordered. ? ?Subjective: No acute complaint.  No bleeding reported.  No headache.  No back pain.  No new focal deficit. ? ?Physical Exam: ?Vitals:  ? 05/16/21 1421 05/16/21 1447 05/16/21 1523 05/16/21 1551  ?BP: (!) 131/51  (!) 149/58 (!) 157/58  ?Pulse: 78  76 78  ?Resp: $Remov'16  16 16  'cxuukx$ ?Temp:    98.2 ?F (36.8 ?C)  ?TempSrc:    Oral  ?SpO2: 93% 94% 98% 100%  ?Weight:      ?Height:      ? ?General: Appear in mild distress; no visible Abnormal Neck Mass Or lumps, Conjunctiva normal ?Cardiovascular: S1 and S2 Present, no Murmur, ?Respiratory: good respiratory effort, Bilateral Air entry present and CTA, no Crackles, no wheezes ?Abdomen: Bowel Sound present, Non tender  ?Extremities: no Pedal edema ?Neurology: alert and oriented to time, place, and person ?Gait not checked due to patient safety concerns  ? ?Data Reviewed: ?I have Reviewed nursing notes, Vitals, and Lab results since pt's last encounter. Pertinent lab results CBC and BMP ?I have ordered test including CBC and BMP ?I have reviewed the last note from neurology,   ? ?Family Communication: Daughter at bedside ? ?Disposition: ?Status is: Inpatient ?Remains inpatient appropriate because: Close observation for severe thrombocytopenia and currently receiving IVIG ? ?Author: ?Berle Mull, MD ?05/16/2021 8:12 PM ? ?Please look on [email or web address] to find out who is on call. ?

## 2021-05-16 NOTE — Progress Notes (Addendum)
STROKE TEAM PROGRESS NOTE  ? ?INTERVAL HISTORY ?Her daughter is at the bedside.  Patient was initially sent from Dr. Silvestre Mesi office for abnormal labs D-dimer 4.4, plt 23, hgb 10.2 -> 9.3 on 5/2. On 5/4 she was sent from the cancer center because she was dragging her foot while she was walking which is abnormal for her.  ?Plt 40-> 25 -> 21 this morning, antiplatelet medications are currently being held. Bone marrow biopsy scheduled for Monday at 0830.  ? ?Vitals:  ? 05/16/21 1319 05/16/21 1421 05/16/21 1447 05/16/21 1523  ?BP: (!) 146/55 (!) 131/51  (!) 149/58  ?Pulse: 82 78  76  ?Resp: 18 16  16   ?Temp:      ?TempSrc:      ?SpO2: 94% 93% 94% 98%  ?Weight:      ?Height:      ? ?CBC:  ?Recent Labs  ?Lab 05/14/21 ?1310 05/15/21 ?1657 05/16/21 ?9038  ?WBC 7.1 5.7 7.6  ?NEUTROABS 1.6* 0.8*  --   ?HGB 10.7* 9.6* 8.5*  ?HCT 33.0* 29.3* 26.2*  ?MCV 93.0 92.1 91.9  ?PLT 34* 25* 21*  ? ? ?Basic Metabolic Panel:  ?Recent Labs  ?Lab 05/15/21 ?3338 05/16/21 ?3291  ?NA 138 139  ?K 3.3* 4.0  ?CL 107 111  ?CO2 21* 21*  ?GLUCOSE 119* 102*  ?BUN 11 8  ?CREATININE 1.05* 0.94  ?CALCIUM 8.2* 8.3*  ?MG 1.7  --   ? ? ?Lipid Panel:  ?Recent Labs  ?Lab 05/15/21 ?0412  ?CHOL 88  ?TRIG 139  ?HDL 50  ?CHOLHDL 1.8  ?VLDL 28  ?[place] 10  ? ? ?HgbA1c:  ?Recent Labs  ?Lab 05/15/21 ?0412  ?HGBA1C 6.8*  ? ? ?Urine Drug Screen: No results for input(s): LABOPIA, COCAINSCRNUR, LABBENZ, AMPHETMU, THCU, LABBARB in the last 168 hours.  ?Alcohol Level No results for input(s): ETH in the last 168 hours. ? ?IMAGING past 24 hours ?US Abdomen Complete ? ?Result Date: 05/15/2021 ?CLINICAL DATA:  Splenomegaly. EXAM: ABDOMEN ULTRASOUND COMPLETE COMPARISON:  August 12, 2006. FINDINGS: Gallbladder: Possible adenomyomatosis of the gallbladder wall. No gallstones or wall thickening visualized. No sonographic Murphy sign noted by sonographer. Common bile duct: Diameter: 4 mm which is within normal limits. Liver: No focal lesion identified. Within normal limits in  parenchymal echogenicity. Portal vein is patent on color Doppler imaging with normal direction of blood flow towards the liver. IVC: No abnormality visualized. Pancreas: 9 mm cystic lesion is seen in the posterior portion of the pancreatic head. No ductal dilatation is noted. Spleen: Size and appearance within normal limits. Right Kidney: Length: 10.3 cm. Echogenicity within normal limits. No mass or hydronephrosis visualized. Left Kidney: Length: 10.8 cm. Echogenicity within normal limits. No mass or hydronephrosis visualized. Abdominal aorta: No aneurysm visualized. Other findings: None. IMPRESSION: 9 mm cystic abnormality seen in posterior portion of pancreatic head. No ductal dilatation is noted. When the patient is clinically stable and able to follow directions and hold their breath (preferably as an outpatient) further evaluation with dedicated abdominal MRI should be considered. Possible adenomyomatosis of gallbladder wall. No cholelithiasis or cholecystitis is noted. Electronically Signed   By: Marijo Conception M.D.   On: 05/15/2021 16:17   ? ?PHYSICAL EXAM ? ?Physical Exam  ?Constitutional: Appears well-developed and well-nourished.  ?Cardiovascular: Normal rate and regular rhythm.  ?Respiratory: Effort normal, non-labored breathing ? ?Neuro: ?Mental Status: ?Patient is awake, alert, oriented to person, place, month, year, and situation. ?Patient is able to give a clear and coherent history. ?  No signs of aphasia or neglect ?Cranial Nerves: ?II: Visual Fields are full. Pupils are equal, round, and reactive to light.   ?III,IV, VI: EOMI without ptosis or diploplia.  ?V: Facial sensation is symmetric to temperature ?VII: Facial movement is symmetric resting and smiling ?VIII: Hearing is intact to voice ?X: Palate elevates symmetrically ?XI: Shoulder shrug is symmetric. ?XII: Tongue protrudes midline without atrophy or fasciculations.  ?Motor: ?Tone is normal. Bulk is normal. 5/5 strength was present in all  four extremities.  ?Sensory: ?Sensation is symmetric to light touch and temperature in the arms and legs. No extinction to DSS present.  ?Cerebellar: ?FNF and HKS are intact bilaterally ? ? ? ?ASSESSMENT/PLAN ?[name] is a 88 y.o. female with history of anemia, B12 deficiency, CRVO OS, glaucoma OD, HLD, HTN, CKD, migraines, OSA, prior fall with skull fx and concussion and osteoarthritis who initially presented to the [facility] ED after daughter noted her to be dragging her left leg while ambulating. MRI was obtained, revealing multifocal small strokes in separate vascular territories.  Platelet count 21 this morning. HemOnc planning for a bone marrow biopsy on Monday.  ? ?Stroke:  Multiple bilateral, R>L, small acute ischemic infarcts in multiple vascular territories, source unclear, need to rule out TTP. Other DDx including hypercoagulable state, occult afib but so far EKG negative ?CTA head & neck unremarkable ?MRI   Five foci of restricted diffusion scattered within the supratentorial brain, as described and measuring up to 9 mm. multiple small chronic cortically-based infarcts. scattered chronic microhemorrhages may reflect sequela of hypertensive microangiopathy and/or early manifestations of cerebral amyloid angiopathy.  ?2D Echo EF 60-65% ?LE venous Doppler negative for DVT ?May consider 30 day cardiac monitoring when pt can be candidate for anticoagulation ?EKG no afib (machine reading error on 05/14/21) - continue tele  ?LDL 10 ?HgbA1c 6.8 ?Will check hypercoagulable work-up ?VTE prophylaxis - SCDs ?clopidogrel 75 mg daily prior to admission, now on No antithrombotic given severe thrombocytopenia ?Therapy recommendations:  Home health- lives at an Assisted Living ?Disposition:  pending ? ?Thrombocytopenia ?History of pancytopenia ?Platelet 42-34-25 -> 21 ?Hematology on board ?Plan for bone marrow biopsy on Monday ?Hold off antiplatelet ?Patient may not be a good candidate for  anticoagulation ? ?Hypertension ?Home meds: Olmesartan, metoprolol succinate, Lasix, hydralazine ?Stable ?Long-term BP goal normotensive ? ?Hyperlipidemia ?Home meds: Evolocumab 137m/ml, Zetia, resumed in hospital ?LDL 10, goal < 70 ?Continue Repatha at discharge ? ?Other Stroke Risk Factors ?Advanced Age >/= 637 ? ?Other Active Problems ?Dementia- Namenda 10 mg, galantamine 121m ?Depression-Lexapro 5 mg ?Hypothyroidism-Synthroid 50 mcg ? ?Hospital day # 2 ? ?Patient seen and examined by NP/APP with MD. MD to update note as needed.  ? ?DeJanine OresDNP, FNP-BC ?[facility] ?Pager: [phone] ?ATTENDING ATTESTATION: ? ?Antiplatelets held for bone marrow biopsy scheduled for Monday.  Exam is stable.  Patient and her daughter's questions were answered to their satisfaction ? ?Dr. PaReeves Forthvaluated pt independently, reviewed imaging, chart, labs. Discussed and formulated plan with the APP. Please see APP note above for details.   Total 36 minutes spent on counseling patient and coordinating care, writing notes and reviewing chart. ?  ? ?Navy Belay,MD  ? ? ? ?To contact Stroke Continuity provider, please refer to [email or web address] hours, contact General Neurology ? ?

## 2021-05-17 DIAGNOSIS — I639 Cerebral infarction, unspecified: Secondary | ICD-10-CM | POA: Diagnosis not present

## 2021-05-17 DIAGNOSIS — R0902 Hypoxemia: Secondary | ICD-10-CM | POA: Diagnosis not present

## 2021-05-17 LAB — CBC
HCT: 25.2 % — ABNORMAL LOW (ref 36.0–46.0)
Hemoglobin: 8.4 g/dL — ABNORMAL LOW (ref 12.0–15.0)
MCH: 30.8 pg (ref 26.0–34.0)
MCHC: 33.3 g/dL (ref 30.0–36.0)
MCV: 92.3 fL (ref 80.0–100.0)
Platelets: 35 10*3/uL — ABNORMAL LOW (ref 150–400)
RBC: 2.73 MIL/uL — ABNORMAL LOW (ref 3.87–5.11)
RDW: 16.1 % — ABNORMAL HIGH (ref 11.5–15.5)
WBC: 4.1 10*3/uL (ref 4.0–10.5)
nRBC: 0.7 % — ABNORMAL HIGH (ref 0.0–0.2)

## 2021-05-17 LAB — BETA-2-GLYCOPROTEIN I ABS, IGG/M/A
Beta-2 Glyco I IgG: <9 GPI IgG units (ref 0–20)
Beta-2-Glycoprotein I IgA: <9 GPI IgA units (ref 0–25)
Beta-2-Glycoprotein I IgM: <9 GPI IgM units (ref 0–32)

## 2021-05-17 LAB — CBC WITH DIFFERENTIAL/PLATELET
Abs Immature Granulocytes: 0.4 10*3/uL — ABNORMAL HIGH (ref 0.00–0.07)
Band Neutrophils: 3 %
Basophils Absolute: 0.1 10*3/uL (ref 0.0–0.1)
Basophils Relative: 1 %
Eosinophils Absolute: 0.1 10*3/uL (ref 0.0–0.5)
Eosinophils Relative: 2 %
HCT: 25.1 % — ABNORMAL LOW (ref 36.0–46.0)
Hemoglobin: 8.2 g/dL — ABNORMAL LOW (ref 12.0–15.0)
Lymphocytes Relative: 73 %
Lymphs Abs: 5 10*3/uL — ABNORMAL HIGH (ref 0.7–4.0)
MCH: 29.8 pg (ref 26.0–34.0)
MCHC: 32.7 g/dL (ref 30.0–36.0)
MCV: 91.3 fL (ref 80.0–100.0)
Metamyelocytes Relative: 4 %
Monocytes Absolute: 0.1 10*3/uL (ref 0.1–1.0)
Monocytes Relative: 2 %
Myelocytes: 1 %
Neutro Abs: 1.1 10*3/uL — ABNORMAL LOW (ref 1.7–7.7)
Neutrophils Relative %: 13 %
Platelets: 16 10*3/uL — CL (ref 150–400)
Promyelocytes Relative: 1 %
RBC: 2.75 MIL/uL — ABNORMAL LOW (ref 3.87–5.11)
RDW: 15.9 % — ABNORMAL HIGH (ref 11.5–15.5)
WBC: 6.9 10*3/uL (ref 4.0–10.5)
nRBC: 0.4 % — ABNORMAL HIGH (ref 0.0–0.2)
nRBC: 1 /100 WBC — ABNORMAL HIGH

## 2021-05-17 LAB — BASIC METABOLIC PANEL
Anion gap: 6 (ref 5–15)
BUN: 7 mg/dL — ABNORMAL LOW (ref 8–23)
CO2: 22 mmol/L (ref 22–32)
Calcium: 8.3 mg/dL — ABNORMAL LOW (ref 8.9–10.3)
Chloride: 109 mmol/L (ref 98–111)
Creatinine, Ser: 0.88 mg/dL (ref 0.44–1.00)
GFR, Estimated: >60 mL/min (ref >60)
Glucose, Bld: 105 mg/dL — ABNORMAL HIGH (ref 70–99)
Potassium: 3.6 mmol/L (ref 3.5–5.1)
Sodium: 137 mmol/L (ref 135–145)

## 2021-05-17 LAB — MAGNESIUM: Magnesium: 1.5 mg/dL — ABNORMAL LOW (ref 1.7–2.4)

## 2021-05-17 MED ORDER — SODIUM CHLORIDE 0.9% IV SOLUTION: Freq: Once | INTRAVENOUS | Status: AC

## 2021-05-17 MED ORDER — MAGNESIUM SULFATE 4 GM/100ML IV SOLN
4.0000 g | Freq: Once | INTRAVENOUS | Status: AC
  Administered 2021-05-17: 4 g via INTRAVENOUS
  Filled 2021-05-17: qty 100

## 2021-05-17 NOTE — Progress Notes (Addendum)
STROKE TEAM PROGRESS NOTE  ? ?INTERVAL HISTORY ?Deborah Tucker is at the bedside, who is a Lexicographer.  ?Platelet's con't to be low. antiplatelet medications are currently being held. Bone marrow biopsy scheduled for Monday at 0830. She is doing well, no complaints. No bleeding. No new weakness. Getting Mg infusion this am. ? ?Vitals:  ? 05/17/21 0844 05/17/21 1226 05/17/21 1244 05/17/21 1300  ?BP: (!) 156/66 (!) 129/57 (!) 144/60 (!) 151/82  ?Pulse: 78 73 76 77  ?Resp: _0 ?Temp: 98.2 ?F (36.8 ?C) 99.1 ?F (37.3 ?C) 98.6 ?F (37 ?C) 98.7 ?F (37.1 ?C)  ?TempSrc: Oral Oral Oral Oral  ?SpO2: 93% 95% 99% 98%  ?Weight:      ?Height:      ? ?CBC:  ?Recent Labs  ?Lab 05/15/21 ?3646 05/16/21 ?8032 05/17/21 ?0132  ?WBC 5.7 7.6 6.9  ?NEUTROABS 0.8*  --  1.1*  ?HGB 9.6* 8.5* 8.2*  ?HCT 29.3* 26.2* 25.1*  ?MCV 92.1 91.9 91.3  ?PLT 25* 21* 16*  ? ? ?Basic Metabolic Panel:  ?Recent Labs  ?Lab 05/15/21 ?1224 05/16/21 ?8250 05/17/21 ?0132  ?NA 138 139 137  ?K 3.3* 4.0 3.6  ?CL 107 111 109  ?CO2 21* 21* 22  ?GLUCOSE 119* 102* 105*  ?BUN 11 8 7*  ?CREATININE 1.05* 0.94 0.88  ?CALCIUM 8.2* 8.3* 8.3*  ?MG 1.7  --  1.5*  ? ? ?Lipid Panel:  ?Recent Labs  ?Lab 05/15/21 ?0412  ?CHOL 88  ?TRIG 139  ?HDL 50  ?CHOLHDL 1.8  ?VLDL 28  ?[place] 10  ? ? ?HgbA1c:  ?Recent Labs  ?Lab 05/15/21 ?0412  ?HGBA1C 6.8*  ? ? ?Urine Drug Screen: No results for input(s): LABOPIA, COCAINSCRNUR, LABBENZ, AMPHETMU, THCU, LABBARB in the last 168 hours.  ?Alcohol Level No results for input(s): ETH in the last 168 hours. ? ?IMAGING past 24 hours ?No results found. ? ?PHYSICAL EXAM ? ?Physical Exam  ?Constitutional: Appears well-developed and well-nourished.  ?Sitting up in chair. ?Cardiovascular: Normal rate and regular rhythm.  ?Respiratory: Effort normal, non-labored breathing ? ?Neuro: ?Mental Status: ?Patient is awake, alert, oriented to person, place, month, year, and situation. ?Patient is able to give a clear and coherent history. ?No signs of aphasia or  neglect ?Cranial Nerves: ?II: Visual Fields are full. Pupils are equal, round, and reactive to light.   ?III,IV, VI: EOMI without ptosis or diploplia.  ?VII: Facial movement is symmetric resting and smiling ?VIII: Hearing is intact to voice  ?Motor: ?Tone is normal. Bulk is normal. 5/5 strength was present in all four extremities.  ? ? ?ASSESSMENT/PLAN ?Deborah Tucker is a 88 y.o. female with history of anemia, B12 deficiency, CRVO OS, glaucoma OD, HLD, HTN, CKD, migraines, OSA, prior fall with skull fx and concussion and osteoarthritis who initially presented to the [facility] ED after daughter noted Deborah to be dragging Deborah left leg while ambulating. MRI was obtained, revealing multifocal small strokes in separate vascular territories.  Platelet count low this morning. HemOnc planning for a bone marrow biopsy on Monday.  ? ?Stroke:  Multiple bilateral, R>L, small acute ischemic infarcts in multiple vascular territories, source unclear, need to rule out TTP. Other DDx including hypercoagulable state, occult afib but so far EKG negative ?CTA head & neck unremarkable ?MRI   Five foci of restricted diffusion scattered within the supratentorial brain, as described and measuring up to 9 mm. multiple small chronic cortically-based infarcts. scattered chronic microhemorrhages may reflect sequela of hypertensive microangiopathy and/or early  manifestations of cerebral amyloid angiopathy.  ?2D Echo EF 60-65% ?LE venous Doppler negative for DVT ?May consider 30 day cardiac monitoring when pt can be candidate for anticoagulation ?EKG no afib (machine reading error on 05/14/21) - continue tele  ?LDL 10 ?HgbA1c 6.8 ? hypercoagulable work-up pending. ?VTE prophylaxis - SCDs ?clopidogrel 75 mg daily prior to admission, now on No antithrombotic given severe thrombocytopenia ?Therapy recommendations:  Home health- lives at an Assisted Living ?Disposition:  pending ? ?Thrombocytopenia ?History of pancytopenia ?Platelet 42-34-25 ->  21 ?Hematology on board ?Plan for bone marrow biopsy on Monday ?Hold off antiplatelet ?Patient may not be a good candidate for anticoagulation ? ?Hypertension ?Home meds: Olmesartan, metoprolol succinate, Lasix, hydralazine ?Stable ?Long-term BP goal normotensive ? ?Hyperlipidemia ?Home meds: Evolocumab 132m/ml, Zetia, resumed in hospital ?LDL 10, goal < 70 ?Continue Repatha at discharge ? ?Other Stroke Risk Factors ?Advanced Age >/= 693 ? ?Other Active Problems ?Dementia- Namenda 10 mg, galantamine 168m ?Depression-Lexapro 5 mg ?Hypothyroidism-Synthroid 50 mcg ? ?Hospital day # 3 ? ?Patient seen and examined by NP/APP with MD. MD to update note as needed.  ? ?DeJanine OresDNP, FNP-BC ?[facility] ?Pager: [phone] ?ATTENDING ATTESTATION: ? ?Antiplatelets held for bone marrow biopsy scheduled for Monday, will restart when safe after the procedure.  Exam is stable.  Patient and Deborah Tucker's questions were answered to their satisfaction ? ?Dr. PaReeves Forthvaluated pt independently, reviewed imaging, chart, labs. Discussed and formulated plan with the APP. Please see APP note above for details.   Total of 25 min spent. ?  ? ?[name],MD  ? ? ? ?To contact Stroke Continuity provider, please refer to [email or web address] hours, contact General Neurology ? ?

## 2021-05-17 NOTE — Progress Notes (Signed)
HOSPITAL MEDICINE OVERNIGHT EVENT NOTE   ? ?Notified by nursing that patient states that she wishes to be DNR. ? ?Discussed this personally with the patient.  Patient has made it clear that she does not wish to be resuscitated including compressions, shocks or mechanical ventilation. ? ?The patient she is of sound mind and decision-making capacity per my assessment.  DNR order placed.  ? ?Deborah Emerald  MD ?[facility]  ? ? ? ? ? ? ? ? ? ? ?

## 2021-05-17 NOTE — Progress Notes (Signed)
?  Progress Note ?Patient: Deborah Tucker XYB:[id number] DOB: [birth year] DOA: 05/14/2021  ?DOS: the patient was seen and examined on 05/17/2021 ? ?Brief hospital course: ?PMH of HTN, CRAO, CVA, bicytopenia, B12 deficiency. ?Presents to the hematology office was found to have unilateral left-sided weakness. ?Identified to have acute CVA as well as worsening pancytopenia. ?Neurology and hematology consulted. ?Assessment and Plan: ?* Acute CVA (cerebrovascular accident) ([place]) ?Presents with left-sided weakness. ?CTA negative for any large vessel occlusion or significant stenosis. ?MRI positive for 5 cm stroke also concerning finding for marrow infiltrative process on the MRI. ?Echocardiogram shows preserved EF without any valvular or wall motion abnormality. ?Hemoglobin A1c 6.8.  LDL 10. ?Neurology consulted. ?Lower extremity Doppler negative for DVT. ?Currently not a candidate for anticoagulation or antiplatelet therapy.  Neurology initiated hypercoagulable work-up. ?On Plavix but currently on hold due to severe thrombocytopenia. ?PT OT recommends home with home health. ? ?Pancytopenia ([place]) ?Hematology consulted. ?Seen hematology outpatient. ?Currently no evidence of TTP like picture. ?Further work-up initiated by hematology.  IR consulted for bone marrow biopsy which will be scheduled on Monday. ?Hypercoagulable work-up initiated. ?IVIG ordered by hematology for 2 days. ?Not using steroids as it can impact morphology of the bone marrow biopsy. ?5/7 platelet count dropped down to less than 20,000, transfuse 1 platelet unit.  Recheck value.  No active bleeding. ?Platelet transfusion for platelet less than 20,000. ?Blood transfusion for hemoglobin less than 7.5. ? ?Chronic kidney disease, stage 3a ([place]) ?Renal function stable at baseline. ?Monitor.  Avoid nephrotoxic medications. ? ?Essential hypertension ?Blood pressure controlled.  On Toprol.  We will continue.  On Benicar which is currently on hold.  Also on hold  hydralazine, Lasix. ? ?HLD (hyperlipidemia) ?On Zetia.  LDL is actually 10.  Also on Repatha.  Continue. ? ?Depression ?On Lexapro.  Continue. ? ?Hypoxia ?Mild.  Currently on 2 L of oxygen.  Add incentive spirometry. ? ?Hypothyroidism ?Continue Synthroid as ordered. ? ?Subjective: Denies any acute complaint.  No nausea no vomiting.  Reports some dry cough.  No fever no chills. ? ?Physical Exam: ?Vitals:  ? 05/17/21 1619 05/17/21 1638 05/17/21 1704 05/17/21 1722  ?BP: (!) 146/68 (!) 150/71 (!) 142/62 (!) 154/63  ?Pulse: 79 85 83 81  ?Resp: $Remov'18 20 20 'AvCeHu$ (!) 22  ?Temp: 98.9 ?F (37.2 ?C) 99 ?F (37.2 ?C) 98.8 ?F (37.1 ?C) 98.8 ?F (37.1 ?C)  ?TempSrc: Oral Oral Oral Oral  ?SpO2: 99% 99% 99% 100%  ?Weight:      ?Height:      ? ?General: Appear in mild distress; no visible Abnormal Neck Mass Or lumps, Conjunctiva normal ?Cardiovascular: S1 and S2 Present, no Murmur, ?Respiratory: good respiratory effort, Bilateral Air entry present and CTA, no Crackles, no wheezes ?Abdomen: Bowel Sound present, Non tender  ?Extremities: no Pedal edema ?Neurology: alert and oriented to time, place, and person ?Gait not checked due to patient safety concerns  ? ?Data Reviewed: ?I have Reviewed nursing notes, Vitals, and Lab results since pt's last encounter. Pertinent lab results CBC and BMP ?I have ordered test including CBC ?I have discussed pt's care plan and test results with neurology.  ? ?Family Communication: None at bedside ? ?Disposition: ?Status is: Inpatient ?Remains inpatient appropriate because: Ongoing issues with pancytopenia requiring transfusion. ? ?Author: ?Berle Mull, MD ?05/17/2021 5:40 PM ? ?Please look on [email or web address] to find out who is on call. ?

## 2021-05-17 NOTE — Plan of Care (Signed)
?  Problem: Education: ?Goal: Knowledge of disease or condition will improve ?Outcome: Progressing ?Goal: Knowledge of secondary prevention will improve ([place] [facility]) ?Outcome: Progressing ?Goal: Knowledge of patient specific risk factors will improve (INDIVIDUALIZE FOR PATIENT) ?Outcome: Progressing ?  ?

## 2021-05-17 NOTE — Assessment & Plan Note (Addendum)
Mild.  Currently on 2 L of oxygen.  Add incentive spirometry. ?Currently saturating 90% on room air. ?

## 2021-05-18 ENCOUNTER — Inpatient Hospital Stay (HOSPITAL_COMMUNITY): Payer: Medicare Other

## 2021-05-18 DIAGNOSIS — I639 Cerebral infarction, unspecified: Secondary | ICD-10-CM | POA: Diagnosis not present

## 2021-05-18 DIAGNOSIS — D693 Immune thrombocytopenic purpura: Secondary | ICD-10-CM | POA: Diagnosis not present

## 2021-05-18 DIAGNOSIS — E785 Hyperlipidemia, unspecified: Secondary | ICD-10-CM | POA: Diagnosis not present

## 2021-05-18 DIAGNOSIS — D61818 Other pancytopenia: Secondary | ICD-10-CM | POA: Diagnosis not present

## 2021-05-18 DIAGNOSIS — I10 Essential (primary) hypertension: Secondary | ICD-10-CM | POA: Diagnosis not present

## 2021-05-18 LAB — CBC
HCT: 25.7 % — ABNORMAL LOW (ref 36.0–46.0)
Hemoglobin: 8.3 g/dL — ABNORMAL LOW (ref 12.0–15.0)
MCH: 29.6 pg (ref 26.0–34.0)
MCHC: 32.3 g/dL (ref 30.0–36.0)
MCV: 91.8 fL (ref 80.0–100.0)
Platelets: 19 10*3/uL — CL (ref 150–400)
RBC: 2.8 MIL/uL — ABNORMAL LOW (ref 3.87–5.11)
RDW: 15.9 % — ABNORMAL HIGH (ref 11.5–15.5)
WBC: 6.7 10*3/uL (ref 4.0–10.5)
nRBC: 0.3 % — ABNORMAL HIGH (ref 0.0–0.2)

## 2021-05-18 LAB — CBC WITH DIFFERENTIAL/PLATELET
Abs Immature Granulocytes: 0 10*3/uL (ref 0.00–0.07)
Basophils Absolute: 0 10*3/uL (ref 0.0–0.1)
Basophils Relative: 0 %
Eosinophils Absolute: 0 10*3/uL (ref 0.0–0.5)
Eosinophils Relative: 0 %
HCT: 24.9 % — ABNORMAL LOW (ref 36.0–46.0)
Hemoglobin: 8.3 g/dL — ABNORMAL LOW (ref 12.0–15.0)
Immature Granulocytes: 0 %
Lymphocytes Relative: 26 %
Lymphs Abs: 1.7 10*3/uL (ref 0.7–4.0)
MCH: 30 pg (ref 26.0–34.0)
MCHC: 33.3 g/dL (ref 30.0–36.0)
MCV: 89.9 fL (ref 80.0–100.0)
Monocytes Absolute: 1.8 10*3/uL — ABNORMAL HIGH (ref 0.1–1.0)
Monocytes Relative: 27 %
Neutro Abs: 3 10*3/uL (ref 1.7–7.7)
Neutrophils Relative %: 47 %
Platelets: 24 10*3/uL — CL (ref 150–400)
RBC: 2.77 MIL/uL — ABNORMAL LOW (ref 3.87–5.11)
RDW: 15.8 % — ABNORMAL HIGH (ref 11.5–15.5)
WBC: 6.4 10*3/uL (ref 4.0–10.5)
nRBC: 0.3 % — ABNORMAL HIGH (ref 0.0–0.2)

## 2021-05-18 LAB — CARDIOLIPIN ANTIBODIES, IGG, IGM, IGA
Anticardiolipin IgA: <9 APL U/mL (ref 0–11)
Anticardiolipin IgG: <9 GPL U/mL (ref 0–14)
Anticardiolipin IgM: <9 MPL U/mL (ref 0–12)

## 2021-05-18 LAB — HEMATOLOGY COMMENTS:

## 2021-05-18 LAB — BASIC METABOLIC PANEL
Anion gap: 6 (ref 5–15)
BUN: 12 mg/dL (ref 8–23)
CO2: 24 mmol/L (ref 22–32)
Calcium: 8.1 mg/dL — ABNORMAL LOW (ref 8.9–10.3)
Chloride: 105 mmol/L (ref 98–111)
Creatinine, Ser: 0.8 mg/dL (ref 0.44–1.00)
GFR, Estimated: >60 mL/min (ref >60)
Glucose, Bld: 107 mg/dL — ABNORMAL HIGH (ref 70–99)
Potassium: 3.5 mmol/L (ref 3.5–5.1)
Sodium: 135 mmol/L (ref 135–145)

## 2021-05-18 LAB — PATHOLOGIST SMEAR REVIEW

## 2021-05-18 LAB — BPAM PLATELET PHERESIS
Blood Product Expiration Date: 202305082359
ISSUE DATE / TIME: 202305071235
Unit Type and Rh: 5100

## 2021-05-18 LAB — FOLATE RBC
Folate, Hemolysate: 303 ng/mL
Folate, RBC: UNDETERMINED ng/mL

## 2021-05-18 LAB — PREPARE PLATELET PHERESIS: Unit division: 0

## 2021-05-18 LAB — METHYLMALONIC ACID, SERUM: Methylmalonic Acid, Quantitative: 178 nmol/L (ref 0–378)

## 2021-05-18 LAB — MAGNESIUM: Magnesium: 2.1 mg/dL (ref 1.7–2.4)

## 2021-05-18 MED ORDER — SODIUM CHLORIDE 0.9% IV SOLUTION: Freq: Once | INTRAVENOUS | Status: AC

## 2021-05-18 MED ORDER — FENTANYL CITRATE (PF) 100 MCG/2ML IJ SOLN
INTRAMUSCULAR | Status: AC
  Filled 2021-05-18: qty 2

## 2021-05-18 MED ORDER — MIDAZOLAM HCL 2 MG/2ML IJ SOLN
INTRAMUSCULAR | Status: AC
  Filled 2021-05-18: qty 2

## 2021-05-18 MED ORDER — LIDOCAINE HCL 1 % IJ SOLN
INTRAMUSCULAR | Status: AC
  Filled 2021-05-18: qty 10

## 2021-05-18 MED ORDER — FENTANYL CITRATE (PF) 100 MCG/2ML IJ SOLN
INTRAMUSCULAR | Status: AC | PRN
  Administered 2021-05-18: 25 ug via INTRAVENOUS

## 2021-05-18 MED ORDER — SODIUM CHLORIDE 0.9 % IV SOLN
INTRAVENOUS | Status: AC | PRN
  Administered 2021-05-18: 10 mL/h via INTRAVENOUS

## 2021-05-18 MED ORDER — MIDAZOLAM HCL 2 MG/2ML IJ SOLN
INTRAMUSCULAR | Status: AC | PRN
  Administered 2021-05-18: .5 mg via INTRAVENOUS

## 2021-05-18 NOTE — Progress Notes (Signed)
?  Progress Note ?Patient: Deborah Tucker DSK:[id number] DOB: [birth year] DOA: 05/14/2021  ?DOS: the patient was seen and examined on 05/18/2021 ? ?Brief hospital course: ?PMH of HTN, CRAO, CVA, bicytopenia, B12 deficiency. ?Presents to the hematology office was found to have unilateral left-sided weakness. ?Identified to have acute CVA as well as worsening pancytopenia. ?Neurology and hematology consulted. ? ?Assessment and Plan: ?* Acute CVA (cerebrovascular accident) ([place]) ?Presents with left-sided weakness. ?CTA negative for any large vessel occlusion or significant stenosis. ?MRI positive for 5 cm stroke also concerning finding for marrow infiltrative process on the MRI. ?Echocardiogram shows preserved EF without any valvular or wall motion abnormality. ?Hemoglobin A1c 6.8.  LDL 10. ?Neurology consulted. ?Lower extremity Doppler negative for DVT. ?Currently not a candidate for anticoagulation or antiplatelet therapy.  Neurology initiated hypercoagulable work-up. ?On Plavix but currently on hold due to severe thrombocytopenia. ?PT OT recommends home with home health. ? ?Pancytopenia ([place]) ?Hematology consulted. ?Seen hematology outpatient. ?Currently no evidence of TTP like picture. ?Further work-up initiated by hematology.  ?IR perform bone marrow biopsy on 5/8. ?Hypercoagulable work-up initiated. ?IVIG ordered by hematology for 2 days. ? ?Platelet transfusion for platelet less than 20,000. ?Blood transfusion for hemoglobin less than 7.5. ? ?Chronic kidney disease, stage 3a ([place]) ?Renal function stable at baseline. ?Monitor.  Avoid nephrotoxic medications. ? ?Essential hypertension ?Blood pressure controlled.   ?On Toprol. We will continue.   ?On Benicar, hydralazine, Lasix currently on hold. ? ?HLD (hyperlipidemia) ?On Zetia.  LDL is actually 10.  Also on Repatha.  Continue. ? ?Depression ?On Lexapro.  Continue. ? ?Hypoxia ?Mild.  Currently on 2 L of oxygen.  Add incentive spirometry. ?Currently saturating 90% on  room air. ? ?Hypothyroidism ?Continue Synthroid as ordered. ? ?Subjective: No nausea no vomiting.  No fever no chills.  Somewhat fatigued today.  RN reported that the patient had some blood from the bone marrow biopsy site later in the day. ? ?Physical Exam: ?Vitals:  ? 05/18/21 1045 05/18/21 1115 05/18/21 1200 05/18/21 1625  ?BP: (!) 125/53 (!) 124/55 (!) 130/59 (!) 129/57  ?Pulse: 78   86  ?Resp: _0 ?Temp: 99.1 ?F (37.3 ?C) 98.7 ?F (37.1 ?C) 98.6 ?F (37 ?C) 98.6 ?F (37 ?C)  ?TempSrc: Oral Oral Oral Oral  ?SpO2: 97% 97% 99% 97%  ?Weight:      ?Height:      ? ?General: Appear in mild distress; no visible Abnormal Neck Mass Or lumps, Conjunctiva normal ?Cardiovascular: S1 and S2 Present, no Murmur, ?Respiratory: good respiratory effort, Bilateral Air entry present and CTA, no Crackles, no wheezes ?Abdomen: Bowel Sound present, Non tender  ?Extremities: no Pedal edema ?Neurology: alert and oriented to time, place, and person, mild left-sided weakness ?Gait not checked due to patient safety concerns  ? ?Data Reviewed: ?I have Reviewed nursing notes, Vitals, and Lab results since pt's last encounter. Pertinent lab results CBC and BMP ?I have ordered test including CBC and BMP   ? ?Family Communication: Family at bedside ? ?Disposition: ?Status is: Inpatient ?Remains inpatient appropriate because: Need for platelet transfusion and close monitoring ? ?Author: ?Berle Mull, MD ?05/18/2021 7:46 PM ? ?Please look on [email or web address] to find out who is on call. ?

## 2021-05-18 NOTE — Progress Notes (Signed)
OT Cancellation Note ? ?Patient Details ?Name: Kyleena Scheirer ?MRN: [id number] ?DOB: 1930-06-11 ? ? ?Cancelled Treatment:    Reason Eval/Treat Not Completed: Other (comment) (Pt with recent bone marrow biopsy, with active bedrest order. Will follow up as schedule allows) ? ?Lynnda Child, OTD, OTR/L ?Acute Rehab ?[phone] ? ?Kaylyn Lim ?05/18/2021, 11:38 AM ?

## 2021-05-18 NOTE — Progress Notes (Signed)
STROKE TEAM PROGRESS NOTE  ? ?INTERVAL HISTORY ?Her son and daughter are at the bedside .  Platelet count continues to be low at 24,000.  No overt bleeding.antiplatelet medications are currently being held. Bone marrow biopsy done this morning by radiology.. She is doing well, no complaints. No bleeding. No new weakness.  Neurological exam is unchanged.  Vital signs are stable. ? ?Vitals:  ? 05/18/21 1016 05/18/21 1045 05/18/21 1115 05/18/21 1200  ?BP: 118/69 (!) 125/53 (!) 124/55 (!) 130/59  ?Pulse: 85 78    ?Resp: 20 20 15 16   ?Temp: 98.8 ?F (37.1 ?C) 99.1 ?F (37.3 ?C) 98.7 ?F (37.1 ?C) 98.6 ?F (37 ?C)  ?TempSrc: Oral Oral Oral Oral  ?SpO2: 97% 97% 97% 99%  ?Weight:      ?Height:      ? ?CBC:  ?Recent Labs  ?Lab 05/17/21 ?0132 05/17/21 ?1906 05/18/21 ?0253  ?WBC 6.9 4.1 6.4  ?NEUTROABS 1.1*  --  3.0  ?HGB 8.2* 8.4* 8.3*  ?HCT 25.1* 25.2* 24.9*  ?MCV 91.3 92.3 89.9  ?PLT 16* 35* 24*  ? ?Basic Metabolic Panel:  ?Recent Labs  ?Lab 05/17/21 ?0132 05/18/21 ?6812  ?NA 137 135  ?K 3.6 3.5  ?CL 109 105  ?CO2 22 24  ?GLUCOSE 105* 107*  ?BUN 7* 12  ?CREATININE 0.88 0.80  ?CALCIUM 8.3* 8.1*  ?MG 1.5* 2.1  ? ?Lipid Panel:  ?Recent Labs  ?Lab 05/15/21 ?0412  ?CHOL 88  ?TRIG 139  ?HDL 50  ?CHOLHDL 1.8  ?VLDL 28  ?[place] 10  ? ?HgbA1c:  ?Recent Labs  ?Lab 05/15/21 ?0412  ?HGBA1C 6.8*  ? ?Urine Drug Screen: No results for input(s): LABOPIA, COCAINSCRNUR, LABBENZ, AMPHETMU, THCU, LABBARB in the last 168 hours.  ?Alcohol Level No results for input(s): ETH in the last 168 hours. ? ?IMAGING past 24 hours ?CT BONE MARROW BIOPSY ? ?Result Date: 05/18/2021 ?INDICATION: Thrombocytopenia EXAM: CT GUIDED RIGHT ILIAC BONE MARROW ASPIRATION AND CORE BIOPSY Date:  05/18/2021 05/18/2021 9:44 am Radiologist:  M. Daryll Brod, MD Guidance:  CT FLUOROSCOPY: Fluoroscopy Time: None. MEDICATIONS: 1% lidocaine local ANESTHESIA/SEDATION: 0.5 mg IV Versed; 25 mcg IV Fentanyl Moderate Sedation Time:  12 minute The patient was continuously monitored during  the procedure by the interventional radiology nurse under my direct supervision. CONTRAST:  None. COMPLICATIONS: None PROCEDURE: Informed consent was obtained from the patient following explanation of the procedure, risks, benefits and alternatives. The patient understands, agrees and consents for the procedure. All questions were addressed. A time out was performed. The patient was positioned prone and non-contrast localization CT was performed of the pelvis to demonstrate the iliac marrow spaces. Maximal barrier sterile technique utilized including caps, mask, sterile gowns, sterile gloves, large sterile drape, hand hygiene, and Betadine prep. Under sterile conditions and local anesthesia, an 11 gauge coaxial bone biopsy needle was advanced into the right iliac marrow space. Needle position was confirmed with CT imaging. Initially, bone marrow aspiration was performed. Next, the 11 gauge outer cannula was utilized to obtain a right iliac bone marrow core biopsy. Needle was removed. Hemostasis was obtained with compression. The patient tolerated the procedure well. Samples were prepared with the cytotechnologist. No immediate complications. IMPRESSION: CT guided right iliac bone marrow aspiration and core biopsy. Electronically Signed   By: Jerilynn Mages.  Shick M.D.   On: 05/18/2021 10:28   ? ?PHYSICAL EXAM ? ?Physical Exam  ?Constitutional: Appears well-developed and well-nourished pleasant elderly Caucasian lady.  ?Sitting up in chair. ?Cardiovascular: Normal rate and regular rhythm.  ?  Respiratory: Effort normal, non-labored breathing ? ?Neuro: ?Mental Status: ?Patient is awake, alert, oriented to person, place, month, year, and situation. ?Patient is able to give a clear and coherent history. ?No signs of aphasia or neglect ?Cranial Nerves: ?II: Visual Fields are full. Pupils are equal, round, and reactive to light.   ?III,IV, VI: EOMI without ptosis or diploplia.  ?VII: Facial movement is symmetric resting and  smiling ?VIII: Hearing is intact to voice  ?Motor: ?Tone is normal. Bulk is normal. 5/5 strength was present in all four extremities.  ? ? ?ASSESSMENT/PLAN ?[name] is a 88 y.o. female with history of anemia, B12 deficiency, CRVO OS, glaucoma OD, HLD, HTN, CKD, migraines, OSA, prior fall with skull fx and concussion and osteoarthritis who initially presented to the [facility] ED after daughter noted her to be dragging her left leg while ambulating. MRI was obtained, revealing multifocal small strokes in separate vascular territories.  Platelet count low this morning. HemOnc planning for a bone marrow biopsy on Monday.  ? ?Stroke:  Multiple bilateral, R>L, small acute subcortical ischemic infarcts in multiple vascular territories, source unclear, need to rule out TTP. Other DDx including hypercoagulable state, small vessel disease with concurrent multiple infarcts, occult afib but so far EKG negative ?CTA head & neck unremarkable ?MRI   Five foci of restricted diffusion scattered within the supratentorial brain, as described and measuring up to 9 mm. multiple small chronic cortically-based infarcts. scattered chronic microhemorrhages may reflect sequela of hypertensive microangiopathy and/or early manifestations of cerebral amyloid angiopathy.  ?2D Echo EF 60-65% ?LE venous Doppler negative for DVT ?May consider 30 day cardiac monitoring when pt can be candidate for anticoagulation ?EKG no afib (machine reading error on 05/14/21) - continue tele  ?LDL 10 ?HgbA1c 6.8 ? hypercoagulable work-up pending. ?VTE prophylaxis - SCDs ?clopidogrel 75 mg daily prior to admission, now on No antithrombotic given severe thrombocytopenia ?Therapy recommendations:  Home health- lives at an Assisted Living ?Disposition:  pending ? ?Thrombocytopenia ?History of pancytopenia ?Platelet 42-34-25 -> 21 ?Hematology on board ?Plan for bone marrow biopsy on Monday ?Hold off antiplatelet ?Patient may not be a good candidate for  anticoagulation ? ?Hypertension ?Home meds: Olmesartan, metoprolol succinate, Lasix, hydralazine ?Stable ?Long-term BP goal normotensive ? ?Hyperlipidemia ?Home meds: Evolocumab 189m/ml, Zetia, resumed in hospital ?LDL 10, goal < 70 ?Continue Repatha at discharge ? ?Other Stroke Risk Factors ?Advanced Age >/= 631 ? ?Other Active Problems ?Dementia- Namenda 10 mg, galantamine 130m ?Depression-Lexapro 5 mg ?Hypothyroidism-Synthroid 50 mcg ? ?Patient presented with thrombocytopenia of unclear etiology which is being investigated and she had bone marrow biopsy today for.  MRI shows multiple small mostly subcortical infarcts exact etiology is unclear related to hypercoagulability from her renal carcinoma or small vessel disease with incidental current multiple infarcts versus occult A-fib.  Patient at present is not a candidate for anticoagulation or even aggressive antiplatelet therapies given her low platelet count hence will not pursue cardiac monitoring long-term at the present time.  Long discussion with patient and family members at the bedside and answered questions.  Discussed with Dr. PaPosey Pronto Greater than 50% time during this 35-minute visit were spent in counseling and coordination of care about her multiple strokes and thrombocytopenia and answering questions. ? ?PrAntony ContrasMD ? ? ?To contact Stroke Continuity provider, please refer to [email or web address] hours, contact General Neurology ? ?

## 2021-05-18 NOTE — Plan of Care (Signed)
?  Problem: Education: ?Goal: Knowledge of disease or condition will improve ?Outcome: Progressing ?Goal: Knowledge of secondary prevention will improve ([place] [facility]) ?Outcome: Progressing ?Goal: Knowledge of patient specific risk factors will improve (INDIVIDUALIZE FOR PATIENT) ?Outcome: Progressing ?  ?

## 2021-05-18 NOTE — Progress Notes (Signed)
Physical Therapy Treatment ?Patient Details ?Name: Deborah Tucker ?MRN: [id number] ?DOB: 1930/02/06 ?Today's Date: 05/18/2021 ? ? ?History of Present Illness 88 y.o. female who presented to the ED from cancer center with concerns of dragging L foot during gait. MRI revealed multifocal small strokes in separate vascular territories. PMH:  anemia, B12 deficiency, CRVO OS, glaucoma OD, HLD, HTN, CKD, migraines, OA, OSA, prior fall with skull fx and concussion ? ?  ?PT Comments  ? ? Pt fatigued from bone marrow biopsy and additional tests/procedures yesterday. Pt did ask for PT to help position her in bed properly via getting up out of bed as we didn't want to cause R posterior hip incision to bleed again from sheering it across the bed. Pt functioning at min guard/minA. Pt c/o fatigue. Acute PT to cont to follow. ?   ?Recommendations for follow up therapy are one component of a multi-disciplinary discharge planning process, led by the attending physician.  Recommendations may be updated based on patient status, additional functional criteria and insurance authorization. ? ?Follow Up Recommendations ? Home health PT ?  ?  ?Assistance Recommended at Discharge PRN  ?Patient can return home with the following   ?  ?Equipment Recommendations ? None recommended by PT  ?  ?Recommendations for Other Services   ? ? ?  ?Precautions / Restrictions Precautions ?Precautions: Fall ?Restrictions ?Weight Bearing Restrictions: No  ?  ? ?Mobility ? Bed Mobility ?Overal bed mobility: Needs Assistance ?Bed Mobility: Supine to Sit, Sit to Supine ?  ?  ?Supine to sit: Min assist ?Sit to supine: Min assist ?  ?General bed mobility comments: HOB elevated, pt fatigued from procedure, pt pulled up on PTs hand to bring self to EOB, PT assisted with LE mangement back into bed with verbal cues for going down on side ?  ? ?Transfers ?Overall transfer level: Needs assistance ?Equipment used: Rolling walker (2 wheels) ?Transfers: Sit to/from Stand ?Sit  to Stand: Min guard ?  ?  ?  ?  ?  ?General transfer comment: Min Guard A for safety ?  ? ?Ambulation/Gait ?Ambulation/Gait assistance: Min guard ?Gait Distance (Feet): 3 Feet (steps to [facility]) ?Assistive device: Rolling walker (2 wheels) ?Gait Pattern/deviations: Step-through pattern, Decreased stride length ?  ?Gait velocity interpretation: <1.31 ft/sec, indicative of household ambulator ?  ?General Gait Details: Pt just returned from going to the bathroom but was too low in the bed and agreed to stand up and side step to [facility] to get up higher in the bed. explained that PT didn't want to pull her up and sheer the area on her R posterior hip where the biopsy was done and cause it to bleed ? ? ?Stairs ?  ?  ?  ?  ?  ? ? ?Wheelchair Mobility ?  ? ?Modified Rankin (Stroke Patients Only) ?Modified Rankin (Stroke Patients Only) ?Pre-Morbid Rankin Score: No significant disability ?Modified Rankin: Moderate disability ? ? ?  ?Balance Overall balance assessment: Needs assistance ?Sitting-balance support: No upper extremity supported, Feet supported ?Sitting balance-Leahy Scale: Good ?  ?  ?Standing balance support: During functional activity, Bilateral upper extremity supported, Reliant on assistive device for balance ?Standing balance-Leahy Scale: Poor ?  ?  ?  ?  ?  ?  ?  ?  ?  ?  ?  ?  ?  ? ?  ?Cognition Arousal/Alertness: Awake/alert ?Behavior During Therapy: [facility] for tasks assessed/performed, Flat affect (fatigued from procedures yesterday and today) ?Overall Cognitive Status: History of cognitive impairments -  at baseline ?  ?  ?  ?  ?  ?  ?  ?  ?  ?  ?  ?  ?  ?  ?  ?  ?General Comments: Pt reports memory deficits at baseline. A&O x 3. Appropriate. Following commands. ?  ?  ? ?  ?Exercises   ? ?  ?General Comments General comments (skin integrity, edema, etc.): dtr present. pt and dtr reports she doesn't have an appetite ?  ?  ? ?Pertinent Vitals/Pain Pain Assessment ?Pain Assessment: No/denies pain  ? ? ?Home Living  Family/patient expects to be discharged to:: Assisted living ?  ?Available Help at Discharge: Family ?Type of Home: Assisted living ?  ?  ?  ?  ?  ?Home Equipment: Rollator (4 wheels);Shower seat - built in;Hand held shower head;Grab bars - tub/shower;Grab bars - toilet ?Additional Comments: Pt resides at [facility]. She moved from independent living to assisted living in Nov 2022.  ?  ?Prior Function    ?  ?  ?   ? ?PT Goals (current goals can now be found in the care plan section) Acute Rehab PT Goals ?Patient Stated Goal: return to [facility] ?PT Goal Formulation: With patient/family ?Time For Goal Achievement: 05/29/21 ?Potential to Achieve Goals: Good ?Progress towards PT goals: Progressing toward goals ? ?  ?Frequency ? ? ? Min 3X/week ? ? ? ?  ?PT Plan Current plan remains appropriate  ? ? ?Co-evaluation   ?  ?  ?  ?  ? ?  ?AM-PAC PT "6 Clicks" Mobility   ?Outcome Measure ? Help needed turning from your back to your side while in a flat bed without using bedrails?: None ?Help needed moving from lying on your back to sitting on the side of a flat bed without using bedrails?: None ?Help needed moving to and from a bed to a chair (including a wheelchair)?: A Little ?Help needed standing up from a chair using your arms (e.g., wheelchair or bedside chair)?: A Little ?Help needed to walk in hospital room?: A Little ?Help needed climbing 3-5 steps with a railing? : A Lot ?6 Click Score: 19 ? ?  ?End of Session Equipment Utilized During Treatment: Gait belt ?Activity Tolerance: Patient tolerated treatment well ?Patient left: in bed;with call bell/phone within reach;with bed alarm set;with family/visitor present ?Nurse Communication: Mobility status ?PT Visit Diagnosis: Other abnormalities of gait and mobility (R26.89) ?  ? ? ?Time: 9150-5697 ?PT Time Calculation (min) (ACUTE ONLY): 11 min ? ?Charges:  $Therapeutic Activity: 8-22 mins          ?          ? ?Kittie Plater, PT, DPT ?Acute Rehabilitation  Services ?Secure chat preferred ?Office #: [phone] ? ? ? ?Nadir Vasques M Nyeem Stoke ?05/18/2021, 2:35 PM ? ?

## 2021-05-18 NOTE — Procedures (Signed)
Interventional Radiology Procedure Note  Procedure: CT BM ASP AND CORE    Complications: None  Estimated Blood Loss:  MIN  Findings: 11 G CORE AND ASP    M. TREVOR Jontay Maston, MD    

## 2021-05-18 NOTE — Progress Notes (Signed)
Occupational Therapy Treatment ?Patient Details ?Name: Deborah Tucker ?MRN: [id number] ?DOB: [birth year] ?Today's Date: 05/18/2021 ? ? ?History of present illness 88 y.o. female who presented to the ED from cancer center with concerns of dragging L foot during gait. MRI revealed multifocal small strokes in separate vascular territories. PMH:  anemia, B12 deficiency, CRVO OS, glaucoma OD, HLD, HTN, CKD, migraines, OA, OSA, prior fall with skull fx and concussion ?  ?OT comments ? Pt progressing towards goals, completes standing grooming task and LB dressing with min guard-min A this session, transfers and ambulates short distance in room with min A using RW. VSS on supplemental O2 this session, however pt still fatiguing quickly with ADL tasks. Encouraged mobility and use of incentive spirometer, pt and family verbalize understanding. Pt presenting with impairments listed below, will follow acutely. Continue to recommend HHOT at d/c.  ? ?Recommendations for follow up therapy are one component of a multi-disciplinary discharge planning process, led by the attending physician.  Recommendations may be updated based on patient status, additional functional criteria and insurance authorization. ?   ?Follow Up Recommendations ? Home health OT  ?  ?Assistance Recommended at Discharge PRN  ?Patient can return home with the following ? A little help with walking and/or transfers;A little help with bathing/dressing/bathroom ?  ?Equipment Recommendations ? None recommended by OT  ?  ?Recommendations for Other Services   ? ?  ?Precautions / Restrictions Precautions ?Precautions: Fall ?Restrictions ?Weight Bearing Restrictions: No  ? ? ?  ? ?Mobility Bed Mobility ?Overal bed mobility: Needs Assistance ?Bed Mobility: Supine to Sit, Sit to Supine ?  ?  ?Supine to sit: Min guard ?Sit to supine: Min guard ?  ?General bed mobility comments: pt able to pull self up in bed by bridging hips and using overhead rails ?  ? ?Transfers ?Overall  transfer level: Needs assistance ?Equipment used: Rolling walker (2 wheels) ?Transfers: Sit to/from Stand ?Sit to Stand: Min assist ?  ?  ?  ?  ?  ?  ?  ?  ?Balance Overall balance assessment: Needs assistance ?Sitting-balance support: No upper extremity supported, Feet supported ?Sitting balance-Leahy Scale: Good ?  ?  ?Standing balance support: During functional activity, Bilateral upper extremity supported, Reliant on assistive device for balance ?Standing balance-Leahy Scale: Poor ?  ?  ?  ?  ?  ?  ?  ?  ?  ?  ?  ?  ?   ? ?ADL either performed or assessed with clinical judgement  ? ?ADL Overall ADL's : Needs assistance/impaired ?  ?  ?Grooming: Min guard;Standing;Oral care ?Grooming Details (indicate cue type and reason): completed standing at sink ?  ?  ?  ?  ?  ?  ?Lower Body Dressing: Minimal assistance;Sit to/from stand ?Lower Body Dressing Details (indicate cue type and reason): to don brief ?Toilet Transfer: Min guard;Ambulation;Rolling walker (2 wheels) ?Toilet Transfer Details (indicate cue type and reason): simulated in room ?Toileting- Clothing Manipulation and Hygiene: Minimal assistance;Sit to/from stand ?  ?  ?  ?Functional mobility during ADLs: Min guard;Rolling walker (2 wheels) ?  ?  ? ?Extremity/Trunk Assessment Upper Extremity Assessment ?Upper Extremity Assessment: Overall WFL for tasks assessed ?  ?Lower Extremity Assessment ?Lower Extremity Assessment: Defer to PT evaluation ?  ?Cervical / Trunk Assessment ?Cervical / Trunk Assessment: Kyphotic ?  ? ?Vision   ?  ?  ?Perception Perception ?Perception: Not tested ?  ?Praxis Praxis ?Praxis: Not tested ?  ? ?Cognition Arousal/Alertness: Awake/alert ?Behavior During Therapy:  WFL for tasks assessed/performed, Flat affect ?Overall Cognitive Status: History of cognitive impairments - at baseline ?  ?  ?  ?  ?  ?  ?  ?  ?  ?  ?  ?  ?  ?  ?  ?  ?General Comments: Pt reports memory deficits at baseline. A&O x 3. Appropriate. decreased initiation for  tasks, requires verbal cues ?  ?  ?   ?Exercises   ? ?  ?Shoulder Instructions   ? ? ?  ?General Comments pt pulling ~300-500 on incentive spirometer; family present in room, encouraging of pt mobility  ? ? ?Pertinent Vitals/ Pain       Pain Assessment ?Pain Assessment: No/denies pain ? ?Home Living Family/patient expects to be discharged to:: Assisted living ?  ?Available Help at Discharge: Family ?Type of Home: Assisted living ?  ?  ?  ?  ?  ?  ?  ?  ?  ?  ?  ?Home Equipment: Rollator (4 wheels);Shower seat - built in;Hand held shower head;Grab bars - tub/shower;Grab bars - toilet ?  ?Additional Comments: Pt resides at [facility]. She moved from independent living to assisted living in Nov 2022. ? Lives With: Alone ? ?  ?Prior Functioning/Environment    ?  ?  ?  ?   ? ?Frequency ? Min 2X/week  ? ? ? ? ?  ?Progress Toward Goals ? ?OT Goals(current goals can now be found in the care plan section) ? Progress towards OT goals: Progressing toward goals ? ?Acute Rehab OT Goals ?Patient Stated Goal: to go home ?OT Goal Formulation: With patient ?Time For Goal Achievement: 05/29/21 ?Potential to Achieve Goals: Good ?ADL Goals ?Pt Will Perform Upper Body Dressing: with modified independence;sitting ?Pt Will Perform Lower Body Dressing: with modified independence;sit to/from stand ?Pt Will Transfer to Toilet: with modified independence;ambulating;regular height toilet ?Pt Will Perform Toileting - Clothing Manipulation and hygiene: with modified independence;sitting/lateral leans;sit to/from stand  ?Plan Discharge plan remains appropriate;Frequency remains appropriate   ? ?Co-evaluation ? ? ?   ?  ?  ?  ?  ? ?  ?AM-PAC OT "6 Clicks" Daily Activity     ?Outcome Measure ? ? Help from another person eating meals?: None ?Help from another person taking care of personal grooming?: A Little ?Help from another person toileting, which includes using toliet, bedpan, or urinal?: A Little ?Help from another person bathing  (including washing, rinsing, drying)?: A Little ?Help from another person to put on and taking off regular upper body clothing?: A Little ?Help from another person to put on and taking off regular lower body clothing?: A Little ?6 Click Score: 19 ? ?  ?End of Session Equipment Utilized During Treatment: Rolling walker (2 wheels) ? ?OT Visit Diagnosis: Unsteadiness on feet (R26.81);Other abnormalities of gait and mobility (R26.89);Muscle weakness (generalized) (M62.81) ?  ?Activity Tolerance Patient tolerated treatment well ?  ?Patient Left in bed;with call bell/phone within reach;with bed alarm set;with family/visitor present ?  ?Nurse Communication Mobility status (confirmed can see pt post-bone marrow biopsy) ?  ? ?   ? ?Time: 1587-2761 ?OT Time Calculation (min): 29 min ? ?Charges: OT General Charges ?$OT Visit: 1 Visit ?OT Treatments ?$Self Care/Home Management : 23-37 mins ? ?Lynnda Child, OTD, OTR/L ?Acute Rehab ?[phone] ? ? ?Kaylyn Lim ?05/18/2021, 4:31 PM ?

## 2021-05-18 NOTE — Care Management Important Message (Signed)
Important Message ? ?Patient Details  ?Name: Deborah Tucker ?MRN: [id number] ?Date of Birth: 01/05/1931 ? ? ?Medicare Important Message Given:  Yes ? ? ? ? ?Cerra Eisenhower ?05/18/2021, 3:08 PM ?

## 2021-05-19 ENCOUNTER — Other Ambulatory Visit: Payer: Self-pay

## 2021-05-19 ENCOUNTER — Telehealth: Payer: Self-pay

## 2021-05-19 DIAGNOSIS — I639 Cerebral infarction, unspecified: Secondary | ICD-10-CM | POA: Diagnosis not present

## 2021-05-19 DIAGNOSIS — D693 Immune thrombocytopenic purpura: Secondary | ICD-10-CM | POA: Diagnosis not present

## 2021-05-19 LAB — BASIC METABOLIC PANEL
Anion gap: 10 (ref 5–15)
BUN: 13 mg/dL (ref 8–23)
CO2: 22 mmol/L (ref 22–32)
Calcium: 8.1 mg/dL — ABNORMAL LOW (ref 8.9–10.3)
Chloride: 104 mmol/L (ref 98–111)
Creatinine, Ser: 0.98 mg/dL (ref 0.44–1.00)
GFR, Estimated: 56 mL/min — ABNORMAL LOW (ref >60)
Glucose, Bld: 109 mg/dL — ABNORMAL HIGH (ref 70–99)
Potassium: 3.4 mmol/L — ABNORMAL LOW (ref 3.5–5.1)
Sodium: 136 mmol/L (ref 135–145)

## 2021-05-19 LAB — CBC WITH DIFFERENTIAL/PLATELET
Abs Immature Granulocytes: 0 10*3/uL (ref 0.00–0.07)
Basophils Absolute: 0 10*3/uL (ref 0.0–0.1)
Basophils Relative: 0 %
Eosinophils Absolute: 0 10*3/uL (ref 0.0–0.5)
Eosinophils Relative: 0 %
HCT: 24.1 % — ABNORMAL LOW (ref 36.0–46.0)
Hemoglobin: 8 g/dL — ABNORMAL LOW (ref 12.0–15.0)
Immature Granulocytes: 0 %
Lymphocytes Relative: 21 %
Lymphs Abs: 1.8 10*3/uL (ref 0.7–4.0)
MCH: 30.2 pg (ref 26.0–34.0)
MCHC: 33.2 g/dL (ref 30.0–36.0)
MCV: 90.9 fL (ref 80.0–100.0)
Monocytes Absolute: 1.3 10*3/uL — ABNORMAL HIGH (ref 0.1–1.0)
Monocytes Relative: 16 %
Neutro Abs: 5.1 10*3/uL (ref 1.7–7.7)
Neutrophils Relative %: 63 %
Platelets: 44 10*3/uL — ABNORMAL LOW (ref 150–400)
RBC: 2.65 MIL/uL — ABNORMAL LOW (ref 3.87–5.11)
RDW: 15.9 % — ABNORMAL HIGH (ref 11.5–15.5)
WBC: 8.2 10*3/uL (ref 4.0–10.5)
nRBC: 0 % (ref 0.0–0.2)

## 2021-05-19 LAB — BPAM PLATELET PHERESIS
Blood Product Expiration Date: 202305092359
ISSUE DATE / TIME: 202305082238
Unit Type and Rh: 5100

## 2021-05-19 LAB — PREPARE PLATELET PHERESIS: Unit division: 0

## 2021-05-19 LAB — HOMOCYSTEINE: Homocysteine: 10.3 umol/L (ref 0.0–21.3)

## 2021-05-19 LAB — MAGNESIUM: Magnesium: 1.8 mg/dL (ref 1.7–2.4)

## 2021-05-19 NOTE — Plan of Care (Signed)
?  Problem: Education: ?Goal: Knowledge of disease or condition will improve ?Outcome: Progressing ?Goal: Knowledge of secondary prevention will improve (SELECT ALL) ?Outcome: Progressing ?Goal: Knowledge of patient specific risk factors will improve (INDIVIDUALIZE FOR PATIENT) ?Outcome: Progressing ?Goal: Individualized Educational Video(s) ?Outcome: Progressing ?  ?Problem: Coping: ?Goal: Will verbalize positive feelings about self ?Outcome: Progressing ?Goal: Will identify appropriate support needs ?Outcome: Progressing ?  ?Problem: Health Behavior/Discharge Planning: ?Goal: Ability to manage health-related needs will improve ?Outcome: Progressing ?  ?Problem: Self-Care: ?Goal: Ability to participate in self-care as condition permits will improve ?Outcome: Progressing ?Goal: Verbalization of feelings and concerns over difficulty with self-care will improve ?Outcome: Progressing ?Goal: Ability to communicate needs accurately will improve ?Outcome: Progressing ?  ?Problem: Nutrition: ?Goal: Risk of aspiration will decrease ?Outcome: Progressing ?Goal: Dietary intake will improve ?Outcome: Progressing ?  ?Problem: Intracerebral Hemorrhage Tissue Perfusion: ?Goal: Complications of Intracerebral Hemorrhage will be minimized ?Outcome: Progressing ?  ?Problem: Ischemic Stroke/TIA Tissue Perfusion: ?Goal: Complications of ischemic stroke/TIA will be minimized ?Outcome: Progressing ?  ?Problem: Spontaneous Subarachnoid Hemorrhage Tissue Perfusion: ?Goal: Complications of Spontaneous Subarachnoid Hemorrhage will be minimized ?Outcome: Progressing ?  ?Problem: Education: ?Goal: Knowledge of General Education information will improve ?Description: Including pain rating scale, medication(s)/side effects and non-pharmacologic comfort measures ?Outcome: Progressing ?  ?Problem: Health Behavior/Discharge Planning: ?Goal: Ability to manage health-related needs will improve ?Outcome: Progressing ?  ?Problem: Clinical  Measurements: ?Goal: Ability to maintain clinical measurements within normal limits will improve ?Outcome: Progressing ?Goal: Will remain free from infection ?Outcome: Progressing ?Goal: Diagnostic test results will improve ?Outcome: Progressing ?Goal: Respiratory complications will improve ?Outcome: Progressing ?Goal: Cardiovascular complication will be avoided ?Outcome: Progressing ?  ?Problem: Activity: ?Goal: Risk for activity intolerance will decrease ?Outcome: Progressing ?  ?Problem: Nutrition: ?Goal: Adequate nutrition will be maintained ?Outcome: Progressing ?  ?Problem: Coping: ?Goal: Level of anxiety will decrease ?Outcome: Progressing ?  ?Problem: Elimination: ?Goal: Will not experience complications related to bowel motility ?Outcome: Progressing ?Goal: Will not experience complications related to urinary retention ?Outcome: Progressing ?  ?Problem: Pain Managment: ?Goal: General experience of comfort will improve ?Outcome: Progressing ?  ?Problem: Safety: ?Goal: Ability to remain free from injury will improve ?Outcome: Progressing ?  ?Problem: Skin Integrity: ?Goal: Risk for impaired skin integrity will decrease ?Outcome: Progressing ?  ?

## 2021-05-19 NOTE — Progress Notes (Addendum)
Deborah Tucker   DOB:1930/12/07   ZE#:[id number]   AUQ#:[id number] ? ?Hematology follow up  ? ?Subjective: Pt is feeling better overall, she is able to ambulate, appetite is still low, but eats some. No nausea or other new complains. Her mental status is normal. No bleeding. She got one unit plt last night.  ? ? ?Objective:  ?Vitals:  ? 05/19/21 1616 05/19/21 1618  ?BP: (!) 121/46 (!) 125/49  ?Pulse: 82 82  ?Resp: 20   ?Temp: 99.2 ?F (37.3 ?C)   ?SpO2: 98% 98%  ?  Body mass index is 24.27 kg/m?. ? ?Intake/Output Summary (Last 24 hours) at 05/19/2021 1702 ?Last data filed at 05/19/2021 0000 ?Gross per 24 hour  ?Intake 306 ml  ?Output --  ?Net 306 ml  ? ? ? Sclerae unicteric ? No bruise or bleeding  ? MSK no focal spinal tenderness, no peripheral edema ? Neuro nonfocal, alert and oriented  ? ? ?CBG (last 3)  ?No results for input(s): GLUCAP in the last 72 hours. ? ? ?Labs:  ? ?Urine Studies ?No results for input(s): UHGB, CRYS in the last 72 hours. ? ?Invalid input(s): UACOL, UAPR, USPG, UPH, UTP, UGL, UKET, UBIL, UNIT, UROB, ULEU, UEPI, UWBC, URBC, UBAC, CAST, UCOM, BILUA ? ?Basic Metabolic Panel: ?Recent Labs  ?Lab 05/15/21 ?6389 05/16/21 ?3734 05/17/21 ?0132 05/18/21 ?2876 05/19/21 ?0101  ?NA 138 139 137 135 136  ?K 3.3* 4.0 3.6 3.5 3.4*  ?CL 107 111 109 105 104  ?CO2 21* 21* _0 ?GLUCOSE 119* 102* 105* 107* 109*  ?BUN 11 8 7* 12 13  ?CREATININE 1.05* 0.94 0.88 0.80 0.98  ?CALCIUM 8.2* 8.3* 8.3* 8.1* 8.1*  ?MG 1.7  --  1.5* 2.1 1.8  ? ?GFR ?Estimated Creatinine Clearance: 33.6 mL/min (by C-G formula based on SCr of 0.98 mg/dL). ?Liver Function Tests: ?Recent Labs  ?Lab 05/12/21 ?1900 05/14/21 ?1310 05/15/21 ?0843  ?AST _1 ?ALT _2 ?ALKPHOS 172* 162* 150*  ?BILITOT 0.3 0.6 0.8  ?PROT 6.8 7.2 6.2*  ?ALBUMIN 3.3* 3.5 3.0*  ? ?No results for input(s): LIPASE, AMYLASE in the last 168 hours. ?No results for input(s): AMMONIA in the last 168 hours. ?Coagulation profile ?Recent Labs  ?Lab 05/15/21 ?0843  ?INR  1.2  ? ? ?CBC: ?Recent Labs  ?Lab 05/14/21 ?1310 05/15/21 ?8115 05/16/21 ?7262 05/17/21 ?0132 05/17/21 ?1906 05/18/21 ?0355 05/18/21 ?1616 05/19/21 ?0101  ?WBC 7.1 5.7   < > 6.9 4.1 6.4 6.7 8.2  ?NEUTROABS 1.6* 0.8*  --  1.1*  --  3.0  --  5.1  ?HGB 10.7* 9.6*   < > 8.2* 8.4* 8.3* 8.3* 8.0*  ?HCT 33.0*  WRORD 29.3*   < > 25.1* 25.2* 24.9* 25.7* 24.1*  ?MCV 93.0 92.1   < > 91.3 92.3 89.9 91.8 90.9  ?PLT 34* 25*   < > 16* 35* 24* 19* 44*  ? < > = values in this interval not displayed.  ? ?Cardiac Enzymes: ?No results for input(s): CKTOTAL, CKMB, CKMBINDEX, TROPONINI in the last 168 hours. ?BNP: ?Invalid input(s): POCBNP ?CBG: ?No results for input(s): GLUCAP in the last 168 hours. ?D-Dimer ?No results for input(s): DDIMER in the last 72 hours. ?Hgb A1c ?No results for input(s): HGBA1C in the last 72 hours. ?Lipid Profile ?No results for input(s): CHOL, HDL, LDLCALC, TRIG, CHOLHDL, LDLDIRECT in the last 72 hours. ?Thyroid function studies ?No results for input(s): TSH, T4TOTAL, T3FREE, THYROIDAB in the last 72 hours. ? ?Invalid  input(s): FREET3 ?Anemia work up ?No results for input(s): VITAMINB12, FOLATE, FERRITIN, TIBC, IRON, RETICCTPCT in the last 72 hours. ?Microbiology ?Recent Results (from the past 240 hour(s))  ?Urine Culture     Status: Abnormal  ? Collection Time: 05/13/21  6:25 AM  ? Specimen: Urine, Clean Catch  ?Result Value Ref Range Status  ? Specimen Description URINE, CLEAN CATCH  Final  ? Special Requests NONE  Final  ? Culture (A)  Final  ?  <10,000 COLONIES/mL INSIGNIFICANT GROWTH ?Performed at [facility], [place] [address], [place], [place] [address] ?  ? Report Status 05/14/2021 FINAL  Final  ? ? ? ? ?Studies:  ?CT BONE MARROW BIOPSY ? ?Result Date: 05/18/2021 ?INDICATION: Thrombocytopenia EXAM: CT GUIDED RIGHT ILIAC BONE MARROW ASPIRATION AND CORE BIOPSY Date:  05/18/2021 05/18/2021 9:44 am Radiologist:  M. Daryll Brod, MD Guidance:  CT FLUOROSCOPY: Fluoroscopy Time: None. MEDICATIONS: 1%  lidocaine local ANESTHESIA/SEDATION: 0.5 mg IV Versed; 25 mcg IV Fentanyl Moderate Sedation Time:  12 minute The patient was continuously monitored during the procedure by the interventional radiology nurse under my direct supervision. CONTRAST:  None. COMPLICATIONS: None PROCEDURE: Informed consent was obtained from the patient following explanation of the procedure, risks, benefits and alternatives. The patient understands, agrees and consents for the procedure. All questions were addressed. A time out was performed. The patient was positioned prone and non-contrast localization CT was performed of the pelvis to demonstrate the iliac marrow spaces. Maximal barrier sterile technique utilized including caps, mask, sterile gowns, sterile gloves, large sterile drape, hand hygiene, and Betadine prep. Under sterile conditions and local anesthesia, an 11 gauge coaxial bone biopsy needle was advanced into the right iliac marrow space. Needle position was confirmed with CT imaging. Initially, bone marrow aspiration was performed. Next, the 11 gauge outer cannula was utilized to obtain a right iliac bone marrow core biopsy. Needle was removed. Hemostasis was obtained with compression. The patient tolerated the procedure well. Samples were prepared with the cytotechnologist. No immediate complications. IMPRESSION: CT guided right iliac bone marrow aspiration and core biopsy. Electronically Signed   By: Jerilynn Mages.  Shick M.D.   On: 05/18/2021 10:28   ? ?Assessment: 88 y.o. female  ? 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 ? ?Plan:  ?-I talked to hem pathologist Dr. Gari Crown twice today. He has reviewed the BM biopsy and the cyto flow results.  Unfortunately, the bone marrow biopsy sample was hem diluted, and there are lot of necrosis in the marrow, so the BM tissue was limited for reviewing. He reviewed pt's peripheral blood smear also.  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 He has ordered IHC studies in the core sample and result will be available to review tomorrow morning. Formal report will be signed out tomorrow morning. ?-I discussed the preliminary bone marrow biopsy with patient and her 2 daughters this afternoon (before the flow cytometry results returned).  I discussed that this is possible acute mild leukemia, discussed the option of referring her to [place] program.  Due to her advanced age, she is not a candidate for tensive induction chemotherapy, but she is probably a candidate for less intensive, palliative approach therapy, such as azacitidine with or without venetoclax.  Due to the limited bone marrow biopsy sample, she would likely need a second bone marrow biopsy,  for further molecular testing, to see if she is a candidate for targeted therapy.  Patient and her family are open to tolerable therapy, but she is not interested in going through intensive chemotherapy, due to the value of quality of life. ?-I called patient's son in law Dr. Marzetta Board, and left him a message for him to call me back, to discuss the above.  ?-I have reached out to Dr. Linus Orn at [place] program. He can see pt in clinic this Thursday if she does not need to be trasferred to [facility]  ?-if pt remains to be clinically stable, I think she can be discharged home tomorrow, and see Dr. Linus Orn the next day. ?-I will repeat DIC panel for tomorrow morning, and repeat CBC.  ? ?Truitt Merle, MD ?05/19/2021  5:02 PM ? ?

## 2021-05-19 NOTE — Progress Notes (Addendum)
Tele called and said pt had 12 beats  of non-sustained SVT.  She was asleep during that time and awakens easily.  No complaints.  Asymptomatic.  Just completed a platelet transfusion at 0000. Denies chest pain. No palpitations. I did message Dr Cyd Silence via [facility] to notify.  Pt is resting comfortably without complaints.  ?

## 2021-05-19 NOTE — Progress Notes (Signed)
STROKE TEAM PROGRESS NOTE  ? ?INTERVAL HISTORY ?Her   daughter is at the bedside .  She got platelet transfusion yesterday and this morning platelet count is up to 44,000.  No overt bleeding.antiplatelet medications are currently being held. Bone marrow biopsy done and results are pending.. She is doing well, no complaints.    Neurological exam is unchanged.  Vital signs are stable. ? ?Vitals:  ? 05/19/21 0000 05/19/21 0352 05/19/21 0735 05/19/21 1152  ?BP: (!) 157/66 (!) 156/90 (!) 155/75 (!) 133/52  ?Pulse: 85 96 87 76  ?Resp: 18 18 20 14   ?Temp: 98.6 ?F (37 ?C) 98.6 ?F (37 ?C) 99.5 ?F (37.5 ?C) 98.8 ?F (37.1 ?C)  ?TempSrc: Oral Oral Oral Oral  ?SpO2: 95% 98% 97% 95%  ?Weight:      ?Height:      ? ?CBC:  ?Recent Labs  ?Lab 05/18/21 ?0253 05/18/21 ?1616 05/19/21 ?0101  ?WBC 6.4 6.7 8.2  ?NEUTROABS 3.0  --  5.1  ?HGB 8.3* 8.3* 8.0*  ?HCT 24.9* 25.7* 24.1*  ?MCV 89.9 91.8 90.9  ?PLT 24* 19* 44*  ? ?Basic Metabolic Panel:  ?Recent Labs  ?Lab 05/18/21 ?0253 05/19/21 ?0101  ?NA 135 136  ?K 3.5 3.4*  ?CL 105 104  ?CO2 24 22  ?GLUCOSE 107* 109*  ?BUN 12 13  ?CREATININE 0.80 0.98  ?CALCIUM 8.1* 8.1*  ?MG 2.1 1.8  ? ?Lipid Panel:  ?Recent Labs  ?Lab 05/15/21 ?0412  ?CHOL 88  ?TRIG 139  ?HDL 50  ?CHOLHDL 1.8  ?VLDL 28  ?Mesic 10  ? ?HgbA1c:  ?Recent Labs  ?Lab 05/15/21 ?0412  ?HGBA1C 6.8*  ? ?Urine Drug Screen: No results for input(s): LABOPIA, COCAINSCRNUR, LABBENZ, AMPHETMU, THCU, LABBARB in the last 168 hours.  ?Alcohol Level No results for input(s): ETH in the last 168 hours. ? ?IMAGING past 24 hours ?No results found. ? ?PHYSICAL EXAM ? ?Physical Exam  ?Constitutional: Appears well-developed and well-nourished pleasant elderly Caucasian lady.  ?Sitting up in chair. ?Cardiovascular: Normal rate and regular rhythm.  ?Respiratory: Effort normal, non-labored breathing ? ?Neuro: ?Mental Status: ?Patient is awake, alert, oriented to person, place, month, year, and situation. ?Patient is able to give a clear and coherent  history. ?No signs of aphasia or neglect ?Cranial Nerves: ?II: Visual Fields are full. Pupils are equal, round, and reactive to light.   ?III,IV, VI: EOMI without ptosis or diploplia.  ?VII: Facial movement is symmetric resting and smiling ?VIII: Hearing is intact to voice  ?Motor: ?Tone is normal. Bulk is normal. 5/5 strength was present in all four extremities.  ? ? ?ASSESSMENT/PLAN ?[name] is a 88 y.o. female with history of anemia, B12 deficiency, CRVO OS, glaucoma OD, HLD, HTN, CKD, migraines, OSA, prior fall with skull fx and concussion and osteoarthritis who initially presented to the [facility] ED after daughter noted her to be dragging her left leg while ambulating. MRI was obtained, revealing multifocal small strokes in separate vascular territories.  Platelet count low this morning. HemOnc planning for a bone marrow biopsy on Monday.  ? ?Stroke:  Multiple bilateral, R>L, small acute subcortical ischemic infarcts in multiple vascular territories, source unclear, need to rule out TTP. Other DDx including hypercoagulable state, small vessel disease with concurrent multiple infarcts, occult afib but so far EKG negative ?CTA head & neck unremarkable ?MRI   Five foci of restricted diffusion scattered within the supratentorial brain, as described and measuring up to 9 mm. multiple small chronic cortically-based infarcts. scattered chronic microhemorrhages  may reflect sequela of hypertensive microangiopathy and/or early manifestations of cerebral amyloid angiopathy.  ?2D Echo EF 60-65% ?LE venous Doppler negative for DVT ?May consider 30 day cardiac monitoring when pt can be candidate for anticoagulation ?EKG no afib (machine reading error on 05/14/21) - continue tele  ?LDL 10 ?HgbA1c 6.8 ? hypercoagulable work-up pending. ?VTE prophylaxis - SCDs ?clopidogrel 75 mg daily prior to admission, now on No antithrombotic given severe thrombocytopenia ?Therapy recommendations:  Home health- lives at an Assisted  Living ?Disposition:  pending ? ?Thrombocytopenia ?History of pancytopenia ?Platelet 42-34-25 -> 21 ?Hematology on board ?Plan for bone marrow biopsy on Monday ?Hold off antiplatelet ?Patient may not be a good candidate for anticoagulation ? ?Hypertension ?Home meds: Olmesartan, metoprolol succinate, Lasix, hydralazine ?Stable ?Long-term BP goal normotensive ? ?Hyperlipidemia ?Home meds: Evolocumab 158m/ml, Zetia, resumed in hospital ?LDL 10, goal < 70 ?Continue Repatha at discharge ? ?Other Stroke Risk Factors ?Advanced Age >/= 636 ? ?Other Active Problems ?Dementia- Namenda 10 mg, galantamine 161m ?Depression-Lexapro 5 mg ?Hypothyroidism-Synthroid 50 mcg ? ?Patient presented with thrombocytopenia of unclear etiology which is being investigated and she had bone marrow biopsy today for.  MRI shows multiple small mostly subcortical infarcts exact etiology is unclear related to hypercoagulability from her renal carcinoma or small vessel disease with incidental current multiple infarcts versus occult A-fib.  Patient at present is not a candidate for anticoagulation or even aggressive antiplatelet therapies given her low platelet count hence will not pursue cardiac monitoring long-term at the present time.  Long discussion with patient`s daughter and answered questions.  Await bone marrow biopsy results greater than 50% time during this 25-minute visit were spent in counseling and coordination of care about her multiple strokes and thrombocytopenia and answering questions. ? ?PrAntony ContrasMD ? ? ?To contact Stroke Continuity provider, please refer to [email or web address] hours, contact General Neurology ? ?

## 2021-05-19 NOTE — Telephone Encounter (Signed)
Pt's son-in-law LVM asking if Dr. Burr Medico has gotten the preliminary results back from the pt's BM biopsy.  Notified Dr. Burr Medico of pt's son-in-law's call. ?

## 2021-05-19 NOTE — Progress Notes (Signed)
?  Progress Note ?Patient: Deborah Tucker GYF:[id number] DOB: [birth year] DOA: 05/14/2021  ?DOS: the patient was seen and examined on 05/19/2021 ? ?Brief hospital course: ?PMH of HTN, CRAO, CVA, bicytopenia, B12 deficiency. ?Presents to the hematology office was found to have unilateral left-sided weakness. ?Identified to have acute CVA as well as worsening pancytopenia. ?Neurology and hematology consulted. ?Assessment and Plan: ?* Acute CVA (cerebrovascular accident) ([place]) ?Presents with left-sided weakness. ?CTA negative for any large vessel occlusion or significant stenosis. ?MRI positive for 5 cm stroke also concerning finding for marrow infiltrative process on the MRI. ?Neurology consulted. ?Echocardiogram shows preserved EF without any valvular or wall motion abnormality. ?Hemoglobin A1c 6.8.  LDL 10. ?Lower extremity Doppler negative for DVT. ?Currently not a candidate for anticoagulation or antiplatelet therapy.  Neurology initiated hypercoagulable work-up. ?On Plavix but currently on hold due to severe thrombocytopenia. ?PT OT recommends home with home health. ? ?Pancytopenia ([facility]) ?Hematology consulted. ?Seen hematology outpatient. ?Currently no evidence of TTP like picture. ?Further work-up initiated by hematology.  ?IR perform bone marrow biopsy on 5/8. ?Hypercoagulable work-up initiated. ?IVIG ordered by hematology for 2 days. ?So far received 2 platelet transfusion.  Currently main challenge is stability of platelet counts. ? ?Platelet transfusion for platelet less than 20,000. ?Blood transfusion for hemoglobin less than 7.5. ? ?Chronic kidney disease, stage 3a ([place]) ?Renal function stable at baseline. ?Monitor.  Avoid nephrotoxic medications. ? ?Essential hypertension ?Blood pressure controlled.   ?On Toprol. We will continue.   ?On Benicar, hydralazine, Lasix currently on hold. ? ?HLD (hyperlipidemia) ?On Zetia.  LDL is actually 10.  Also on Repatha.  Continue. ? ?Depression ?On Lexapro.   Continue. ? ?Hypoxia ?Mild.  Currently on 2 L of oxygen.  Add incentive spirometry. ?Currently saturating 90% on room air. ? ?Hypothyroidism ?Continue Synthroid as ordered. ? ? ?Addendum. ?Reviewed notes from oncology.  Appears to have AML.  Appreciate oncology and initiating discussion with [place] is to repeat DIC panel and CBC in the morning and consider outpatient follow-up on Thursday at [place] [facility]. ? ?Subjective: No nausea no vomiting or no fever no chills.  Feeling better this morning. ? ?Physical Exam: ?Vitals:  ? 05/19/21 1152 05/19/21 1616 05/19/21 1618 05/19/21 2015  ?BP: (!) 133/52 (!) 121/46 (!) 125/49 (!) 143/55  ?Pulse: 76 82 82 81  ?Resp: _0 ?Temp: 98.8 ?F (37.1 ?C) 99.2 ?F (37.3 ?C)  98.9 ?F (37.2 ?C)  ?TempSrc: Oral Oral  Oral  ?SpO2: 95% 98% 98% 90%  ?Weight:      ?Height:      ? ?General: Appear in mild distress; no visible Abnormal Neck Mass Or lumps, Conjunctiva normal ?Cardiovascular: S1 and S2 Present, no Murmur, ?Respiratory: good respiratory effort, Bilateral Air entry present and CTA, no Crackles, n wheezes ?Abdomen: Bowel Sound present, Non tender  ?Extremities: no Pedal edema ?Neurology: alert and oriented to time, place, and person ?Gait not checked due to patient safety concerns  ? ?Data Reviewed: ?I have Reviewed nursing notes, Vitals, and Lab results since pt's last encounter. Pertinent lab results CBC and BMP ?I have ordered test including CBC and BMP ?I have discussed pt's care plan and test results with hematology.  ? ?Family Communication: Daughter at bedside ? ?Disposition: ?Status is: Inpatient ?Remains inpatient appropriate because: Monitoring for platelet stability. ? ?Author: ?Berle Mull, MD ?05/19/2021  ? ?Please look on [email or web address] to find out who is on call. ?

## 2021-05-20 ENCOUNTER — Inpatient Hospital Stay (HOSPITAL_COMMUNITY): Payer: Medicare Other

## 2021-05-20 ENCOUNTER — Other Ambulatory Visit: Payer: Self-pay

## 2021-05-20 DIAGNOSIS — C9200 Acute myeloblastic leukemia, not having achieved remission: Secondary | ICD-10-CM

## 2021-05-20 DIAGNOSIS — N1831 Chronic kidney disease, stage 3a: Secondary | ICD-10-CM | POA: Diagnosis not present

## 2021-05-20 DIAGNOSIS — I639 Cerebral infarction, unspecified: Secondary | ICD-10-CM | POA: Diagnosis not present

## 2021-05-20 DIAGNOSIS — J9601 Acute respiratory failure with hypoxia: Secondary | ICD-10-CM | POA: Diagnosis not present

## 2021-05-20 DIAGNOSIS — I10 Essential (primary) hypertension: Secondary | ICD-10-CM | POA: Diagnosis not present

## 2021-05-20 LAB — CBC WITH DIFFERENTIAL/PLATELET
Abs Immature Granulocytes: 0 10*3/uL (ref 0.00–0.07)
Basophils Absolute: 0 10*3/uL (ref 0.0–0.1)
Basophils Relative: 0 %
Eosinophils Absolute: 0 10*3/uL (ref 0.0–0.5)
Eosinophils Relative: 0 %
HCT: 25.1 % — ABNORMAL LOW (ref 36.0–46.0)
Hemoglobin: 8.2 g/dL — ABNORMAL LOW (ref 12.0–15.0)
Immature Granulocytes: 0 %
Lymphocytes Relative: 25 %
Lymphs Abs: 1.9 10*3/uL (ref 0.7–4.0)
MCH: 29.7 pg (ref 26.0–34.0)
MCHC: 32.7 g/dL (ref 30.0–36.0)
MCV: 90.9 fL (ref 80.0–100.0)
Monocytes Absolute: 1.9 10*3/uL — ABNORMAL HIGH (ref 0.1–1.0)
Monocytes Relative: 26 %
Neutro Abs: 3.6 10*3/uL (ref 1.7–7.7)
Neutrophils Relative %: 49 %
Platelets: 22 10*3/uL — CL (ref 150–400)
RBC: 2.76 MIL/uL — ABNORMAL LOW (ref 3.87–5.11)
RDW: 15.8 % — ABNORMAL HIGH (ref 11.5–15.5)
WBC: 7.5 10*3/uL (ref 4.0–10.5)
nRBC: 0 % (ref 0.0–0.2)

## 2021-05-20 LAB — DIC (DISSEMINATED INTRAVASCULAR COAGULATION)PANEL
D-Dimer, Quant: 10.7 ug/mL-FEU — ABNORMAL HIGH (ref 0.00–0.50)
Fibrinogen: 339 mg/dL (ref 210–475)
INR: 1.2 (ref 0.8–1.2)
Platelets: 24 10*3/uL — CL (ref 150–400)
Prothrombin Time: 15.5 seconds — ABNORMAL HIGH (ref 11.4–15.2)
Smear Review: NONE SEEN
aPTT: 42 seconds — ABNORMAL HIGH (ref 24–36)

## 2021-05-20 LAB — BASIC METABOLIC PANEL
Anion gap: 8 (ref 5–15)
BUN: 12 mg/dL (ref 8–23)
CO2: 24 mmol/L (ref 22–32)
Calcium: 8.1 mg/dL — ABNORMAL LOW (ref 8.9–10.3)
Chloride: 105 mmol/L (ref 98–111)
Creatinine, Ser: 0.81 mg/dL (ref 0.44–1.00)
GFR, Estimated: >60 mL/min (ref >60)
Glucose, Bld: 108 mg/dL — ABNORMAL HIGH (ref 70–99)
Potassium: 3.5 mmol/L (ref 3.5–5.1)
Sodium: 137 mmol/L (ref 135–145)

## 2021-05-20 LAB — SURGICAL PATHOLOGY

## 2021-05-20 NOTE — Progress Notes (Signed)
? ?PROGRESS NOTE ? ? ? ?Deborah Tucker  OZH:[id number] DOB: 1930/09/09 DOA: 05/14/2021 ?PCP: Crist Infante, MD ? ? ?Brief Narrative: ?PMH of HTN, CRAO, CVA, bicytopenia, B12 deficiency. ?Presents to the hematology office was found to have unilateral left-sided weakness. ?Identified to have acute CVA as well as worsening pancytopenia. ?Neurology and hematology consulted. 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 ? ? ?Assessment and Plan: ?* Acute CVA (cerebrovascular accident) ([place]) ?Presents with left-sided weakness. ?CTA negative for any large vessel occlusion or significant stenosis. ?MRI positive for 5 cm stroke also concerning finding for marrow infiltrative process on the MRI. ?Neurology consulted. ?Echocardiogram shows preserved EF without any valvular or wall motion abnormality. ?Hemoglobin A1c 6.8.  LDL 10. ?Lower extremity Doppler negative for DVT. ?Currently not a candidate for anticoagulation or antiplatelet therapy.  Neurology initiated hypercoagulable work-up. ?On Plavix but currently on hold due to severe thrombocytopenia. ?PT OT recommends home with home health. ? ?Pancytopenia ([place]) ?Hematology consulted. Patient treated with IVIG and has received 2 units of platelets via transfusion. Bone marrow biopsy consistent with AM. Hypercoagulable workup unremarkable. Platelets of 22,000 prior to admission. No evidence of bleeding. Recommendation for outpatient transfusion as recommended by outpatient oncologist. ? ?Chronic kidney disease, stage 3a ([place]) ?Renal function stable at baseline. ?Monitor.  Avoid nephrotoxic medications. ? ?Essential hypertension ?Blood pressure controlled.   ?On Toprol. We will continue.   ?On Benicar, hydralazine, Lasix currently on hold. ? ?HLD (hyperlipidemia) ?On Zetia.  LDL is actually 10.  Also on Repatha.  Continue. ? ?Depression ?On Lexapro.  Continue. ? ?Acute respiratory failure with hypoxia ([place]) ?Unsure of etiology. Some atelectasis on imaging. Patient now requiring 2  L/min of oxygen. ?-Discharge with home oxygen ? ?AML (acute myeloid leukemia) ([place]) ?Newly diagnosed. Plan for quick outpatient follow-up with medical oncology at [facility]. ? ?Hypoxia ?Mild.  Currently on 2 L of oxygen.  Add incentive spirometry. ?Currently saturating 90% on room air. ? ?Hypothyroidism ?Continue Synthroid as ordered. ? ? ? ?DVT prophylaxis: SCDs ?Code Status:   Code Status: DNR ?Family Communication: Daughter at bedside ?Disposition Plan: Discharge to ALF in AM ? ? ?Consultants:  ?Neurology ?Hematology ? ?Procedures:  ?Transthoracic Echocardiogram (05/15/2021) ? ?Antimicrobials: ?None  ? ? ?Subjective: ?No concerns this morning. Feels fine. ? ?Objective: ?BP 136/60 (BP Location: Right Arm)   Pulse 84   Temp 98.5 ?F (36.9 ?C) (Oral)   Resp 18   Ht 5' 3.5" (1.613 m)   Wt 63.1 kg   SpO2 98%   BMI 24.27 kg/m?  ? ?Examination: ? ?General exam: Appears calm and comfortable ?Respiratory system: Clear to auscultation. Respiratory effort normal. ?Cardiovascular system: S1 & S2 heard, RRR. ?Gastrointestinal system: Abdomen is nondistended, soft and nontender. Normal bowel sounds heard. ?Central nervous system: Alert and oriented. Marland Kitchen ?Musculoskeletal: No edema. No calf tenderness ? ? ?Data Reviewed: I have personally reviewed following labs and imaging studies ? ?CBC ?Lab Results  ?Component Value Date  ? WBC 7.5 05/20/2021  ? RBC 2.76 (L) 05/20/2021  ? HGB 8.2 (L) 05/20/2021  ? HCT 25.1 (L) 05/20/2021  ? MCV 90.9 05/20/2021  ? MCH 29.7 05/20/2021  ? PLT 22 (LL) 05/20/2021  ? MCHC 32.7 05/20/2021  ? RDW 15.8 (H) 05/20/2021  ? LYMPHSABS 1.9 05/20/2021  ? MONOABS 1.9 (H) 05/20/2021  ? EOSABS 0.0 05/20/2021  ? BASOSABS 0.0 05/20/2021  ? ? ? ?Last metabolic panel ?Lab Results  ?Component Value Date  ? NA 137 05/20/2021  ? K 3.5 05/20/2021  ?  CL 105 05/20/2021  ? CO2 24 05/20/2021  ? BUN 12 05/20/2021  ? CREATININE 0.81 05/20/2021  ? GLUCOSE 108 (H) 05/20/2021  ? GFRNONAA >60 05/20/2021  ? GFRAA  >60 04/29/2019  ? CALCIUM 8.1 (L) 05/20/2021  ? PROT 6.2 (L) 05/15/2021  ? ALBUMIN 3.0 (L) 05/15/2021  ? BILITOT 0.8 05/15/2021  ? ALKPHOS 150 (H) 05/15/2021  ? AST 17 05/15/2021  ? ALT 13 05/15/2021  ? ANIONGAP 8 05/20/2021  ? ? ?GFR: ?Estimated Creatinine Clearance: 40.6 mL/min (by C-G formula based on SCr of 0.81 mg/dL). ? ?Recent Results (from the past 240 hour(s))  ?Urine Culture     Status: Abnormal  ? Collection Time: 05/13/21  6:25 AM  ? Specimen: Urine, Clean Catch  ?Result Value Ref Range Status  ? Specimen Description URINE, CLEAN CATCH  Final  ? Special Requests NONE  Final  ? Culture (A)  Final  ?  <10,000 COLONIES/mL INSIGNIFICANT GROWTH ?Performed at [facility], [place] [address] [place], [place] [address] ?  ? Report Status 05/14/2021 FINAL  Final  ?  ? ? ?Radiology Studies: ?DG Chest 2 View ? ?Result Date: 05/20/2021 ?CLINICAL DATA:  Hypoxia. EXAM: CHEST - 2 VIEW COMPARISON:  CTA chest 05/12/2021 FINDINGS: Cardiac silhouette is again at the upper limits of normal size. Mediastinal contours are within normal limits with calcification seen within aortic arch. Moderate dextrocurvature of the thoracic spine. Left basilar horizontal linear scarring is similar to prior CT. No definite pleural effusion. No pneumothorax. Moderate multilevel degenerative disc changes of the thoracic spine.Moderate anterior height loss of the approximate T11 vertebral body, similar to prior CT. IMPRESSION: Left basilar horizontal linear likely scarring, similar to recent CT. Electronically Signed   By: Yvonne Kendall M.D.   On: 05/20/2021 15:43   ? ? ? LOS: 6 days  ? ? ?Cordelia Poche, MD ?[facility] ?05/20/2021, 6:54 PM ? ? ?If 7PM-7AM, please contact night-coverage [email or web address] ? ?

## 2021-05-20 NOTE — Progress Notes (Addendum)
STROKE TEAM PROGRESS NOTE  ? ?INTERVAL HISTORY ?Her  2 daughter and son-in-law are at the bedside .  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  She will likely be referred to [facility] hematology as an outpatient to start treatment.  She is doing well, no complaints.    Neurological exam is unchanged.  Vital signs are stable. ? ?Vitals:  ? 05/19/21 2015 05/20/21 0027 05/20/21 0404 05/20/21 1125  ?BP: (!) 143/55 (!) 143/53 (!) 177/65 140/60  ?Pulse: 81 81 89 81  ?Resp: _0 ?Temp: 98.9 ?F (37.2 ?C) 98.2 ?F (36.8 ?C) 98.8 ?F (37.1 ?C) 98.7 ?F (37.1 ?C)  ?TempSrc: Oral Oral Oral Oral  ?SpO2: 90% (!) 87% 90% 100%  ?Weight:      ?Height:      ? ?CBC:  ?Recent Labs  ?Lab 05/19/21 ?0101 05/20/21 ?0219 05/20/21 ?1015  ?WBC 8.2  --  7.5  ?NEUTROABS 5.1  --  3.6  ?HGB 8.0*  --  8.2*  ?HCT 24.1*  --  25.1*  ?MCV 90.9  --  90.9  ?PLT 44* 24* 22*  ? ?Basic Metabolic Panel:  ?Recent Labs  ?Lab 05/18/21 ?0253 05/19/21 ?0101 05/20/21 ?0086  ?NA 135 136 137  ?K 3.5 3.4* 3.5  ?CL 105 104 105  ?CO2 _1 ?GLUCOSE 107* 109* 108*  ?BUN _2 ?CREATININE 0.80 0.98 0.81  ?CALCIUM 8.1* 8.1* 8.1*  ?MG 2.1 1.8  --   ? ?Lipid Panel:  ?Recent Labs  ?Lab 05/15/21 ?0412  ?CHOL 88  ?TRIG 139  ?HDL 50  ?CHOLHDL 1.8  ?VLDL 28  ?[place] 10  ? ?HgbA1c:  ?Recent Labs  ?Lab 05/15/21 ?0412  ?HGBA1C 6.8*  ? ?Urine Drug Screen: No results for input(s): LABOPIA, COCAINSCRNUR, LABBENZ, AMPHETMU, THCU, LABBARB in the last 168 hours.  ?Alcohol Level No results for input(s): ETH in the last 168 hours. ? ?IMAGING past 24 hours ?No results found. ? ?PHYSICAL EXAM ? ?Physical Exam  ?Constitutional: Appears well-developed and well-nourished pleasant elderly Caucasian lady.  ?Sitting up in chair. ?Cardiovascular: Normal rate and regular rhythm.  ?Respiratory: Effort normal, non-labored breathing ? ?Neuro: ?Mental Status: ?Patient is awake, alert, oriented to person, place, month, year, and  situation. ?Patient is able to give a clear and coherent history. ?No signs of aphasia or neglect ?Cranial Nerves: ?II: Visual Fields are full. Pupils are equal, round, and reactive to light.   ?III,IV, VI: EOMI without ptosis or diploplia.  ?VII: Facial movement is symmetric resting and smiling ?VIII: Hearing is intact to voice  ?Motor: ?Tone is normal. Bulk is normal. 5/5 strength was present in all four extremities.  ? ? ?ASSESSMENT/PLAN ?[name] is a 88 y.o. female with history of anemia, B12 deficiency, CRVO OS, glaucoma OD, HLD, HTN, CKD, migraines, OSA, prior fall with skull fx and concussion and osteoarthritis who initially presented to the [facility] ED after daughter noted her to be dragging her left leg while ambulating. MRI was obtained, revealing multifocal small strokes in separate vascular territories.  Platelet count low this morning. HemOnc planning for a bone marrow biopsy on Monday.  ? 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 multiple small chronic cortically-based infarcts. scattered chronic  microhemorrhages may reflect sequela of hypertensive microangiopathy and/or early manifestations of cerebral amyloid angiopathy.  ?2D Echo EF 60-65% ?LE venous Doppler negative for DVT ?EKG no afib (machine reading error on 05/14/21) - continue tele  ?LDL 10 ?HgbA1c 6.8 ? hypercoagulable work-up negative ?VTE prophylaxis - SCDs ?clopidogrel 75 mg daily prior to admission, now on No antithrombotic given severe thrombocytopenia ?Therapy recommendations:  Home health- lives at an Assisted Living ?Disposition:  pending ? ?Thrombocytopenia ?History of pancytopenia ?Platelet 42-34-25 -> 21 ?Hematology on board ?Plan for bone marrow biopsy on  Monday ?Hold off antiplatelet ?Patient may not be a good candidate for anticoagulation ? ?Hypertension ?Home meds: Olmesartan, metoprolol succinate, Lasix, hydralazine ?Stable ?Long-term BP goal normotensive ? ?Hyperlipidemia ?Home meds: Evolocumab 138m/ml, Zetia, resumed in hospital ?LDL 10, goal < 70 ?Continue Repatha at discharge ? ?Other Stroke Risk Factors ?Advanced Age >/= 632 ? ?Other Active Problems ?Dementia- Namenda 10 mg, galantamine 121m ?Depression-Lexapro 5 mg ?Hypothyroidism-Synthroid 50 mcg ? ?Patient presented with thrombocytopenia from underlying acute myelomonocytic leukemia.. Marland KitchenMRI shows multiple small mostly subcortical infarcts exact etiology is unclear related to hypercoagulability from her renal carcinoma or from underlying acute leukemia..  Patient at present is not a candidate for anticoagulation or even aggressive antiplatelet therapies given her low platelet count hence will not pursue cardiac monitoring long-term at the present time.  Long discussion with patient`s daughters , son-in-law and answered questions.  Stroke team will sign off.  Kindly call for questions 50% time during this 35-minute visit were spent in counseling and coordination of care about her multiple strokes and thrombocytopenia and answering questions.  Discussed with Dr. NeLonny Prude ?PrAntony ContrasMD ? ? ?To contact Stroke Continuity provider, please refer to [email or web address] hours, contact General Neurology ? ?

## 2021-05-20 NOTE — Assessment & Plan Note (Signed)
Newly diagnosed. Plan for quick outpatient follow-up with medical oncology at [facility]. ?

## 2021-05-20 NOTE — Discharge Instructions (Signed)
Deborah Tucker, ? ?You were in the hospital with evidence of an acute stroke, but were also found to have evidence of AML (Leukemia/Blood cancer). You have been set up with follow-up at [facility]. ?

## 2021-05-20 NOTE — Progress Notes (Signed)
Occupational Therapy Treatment ?Patient Details ?Name: Deborah Tucker ?MRN: [id number] ?DOB: 24-Feb-1930 ?Today's Date: 05/20/2021 ? ? ?History of present illness 88 y.o. female who presented to the ED from cancer center with concerns of dragging L foot during gait. MRI revealed multifocal small strokes in separate vascular territories. PMH:  anemia, B12 deficiency, CRVO OS, glaucoma OD, HLD, HTN, CKD, migraines, OA, OSA, prior fall with skull fx and concussion ?  ?OT comments ? Pt slowly progressing towards goals, able to complete toilet transfer, standing grooming task, and in room ambulation during session. Pt requiring increased cuing for sequencing, pt able to follow single step instructions throughout session for ADL tasks. L inattention noted, pt with difficulty accurately grasping handle of rollator, requiring hand over hand assistance to correct, bumps into counters/spaces in room on L side. SpO2 walk test performed (see note) observed pt SpO2 dropping to mid 80's on RA in semi-reclined position in bed, 90-91% during ambulation, pt asymptomatic. Pt presenting with impairments listed below, will follow acutely. Continue to recommend HHOT at d/c.  ? ?Recommendations for follow up therapy are one component of a multi-disciplinary discharge planning process, led by the attending physician.  Recommendations may be updated based on patient status, additional functional criteria and insurance authorization. ?   ?Follow Up Recommendations ? Home health OT  ?  ?Assistance Recommended at Discharge PRN  ?Patient can return home with the following ? A little help with walking and/or transfers;A little help with bathing/dressing/bathroom ?  ?Equipment Recommendations ? None recommended by OT  ?  ?Recommendations for Other Services   ? ?  ?Precautions / Restrictions Precautions ?Precautions: Fall ?Restrictions ?Weight Bearing Restrictions: No  ? ? ?  ? ?Mobility Bed Mobility ?Overal bed mobility: Needs Assistance ?Bed  Mobility: Supine to Sit ?  ?  ?Supine to sit: Min guard ?Sit to supine: Min guard ?  ?  ?  ? ?Transfers ?Overall transfer level: Needs assistance ?Equipment used: Rollator (4 wheels) ?Transfers: Sit to/from Stand ?Sit to Stand: Min assist ?  ?  ?  ?  ?  ?  ?  ?  ?Balance Overall balance assessment: Needs assistance ?Sitting-balance support: No upper extremity supported, Feet supported ?Sitting balance-Leahy Scale: Fair ?  ?  ?Standing balance support: During functional activity, Bilateral upper extremity supported, Reliant on assistive device for balance ?Standing balance-Leahy Scale: Poor ?Standing balance comment: dependent on RW ?  ?  ?  ?  ?  ?  ?  ?  ?  ?  ?  ?   ? ?ADL either performed or assessed with clinical judgement  ? ?ADL   ?  ?  ?Grooming: Wash/dry hands;Standing;Min guard ?Grooming Details (indicate cue type and reason): cuing for sequencing of task ?  ?  ?  ?  ?  ?  ?  ?  ?Toilet Transfer: Ambulation;Rollator (4 wheels);Regular Toilet;Minimal assistance ?  ?Toileting- Clothing Manipulation and Hygiene: Sitting/lateral lean;Sit to/from stand;Minimal assistance ?Toileting - Clothing Manipulation Details (indicate cue type and reason): for clothing mgmt ?  ?  ?  ?  ?  ? ?Extremity/Trunk Assessment Upper Extremity Assessment ?Upper Extremity Assessment: LUE deficits/detail ?LUE Deficits / Details: decreased ability to grasp RW handle on L side, requiring Hand over hand assistance to correct ?LUE Coordination: decreased fine motor;decreased gross motor ?  ?Lower Extremity Assessment ?Lower Extremity Assessment: Generalized weakness ?  ?  ?  ? ?Vision   ?Additional Comments: some L inattention, will further assess ?  ?Perception Perception ?Perception: Impaired (L  inattention) ?  ?Praxis Praxis ?Praxis: Impaired ?Praxis Impairment Details: Perseveration;Motor planning ?Praxis-Other Comments: requiring step by step cues for basic ADL task, ?  ? ?Cognition Arousal/Alertness: Awake/alert ?Behavior During  Therapy: Flat affect ?Overall Cognitive Status: History of cognitive impairments - at baseline ?  ?  ?  ?  ?  ?  ?  ?  ?  ?  ?  ?  ?  ?  ?  ?  ?General Comments: pt slow to respond especially with use of L UE and LE, requiring tactile cues and multliple verbal cues to complete task, decreased short term memory, unable to recall sequence of events for OT session ?  ?  ?   ?Exercises   ? ?  ?Shoulder Instructions   ? ? ?  ?General Comments walking O2 test performed (see note), SpO2 in low 90's ith ambulation on RA, drops to 86-87% when semi reclined in bed  ? ? ?Pertinent Vitals/ Pain       Pain Assessment ?Pain Assessment: No/denies pain ? ?Home Living   ?  ?  ?  ?  ?  ?  ?  ?  ?  ?  ?  ?  ?  ?  ?  ?  ?  ?  ? ?  ?Prior Functioning/Environment    ?  ?  ?  ?   ? ?Frequency ? Min 2X/week  ? ? ? ? ?  ?Progress Toward Goals ? ?OT Goals(current goals can now be found in the care plan section) ? Progress towards OT goals: Progressing toward goals ? ?Acute Rehab OT Goals ?Patient Stated Goal: to go home ?OT Goal Formulation: With patient ?Time For Goal Achievement: 05/29/21 ?Potential to Achieve Goals: Good ?ADL Goals ?Pt Will Perform Upper Body Dressing: with modified independence;sitting ?Pt Will Perform Lower Body Dressing: with modified independence;sit to/from stand ?Pt Will Transfer to Toilet: with modified independence;ambulating;regular height toilet ?Pt Will Perform Toileting - Clothing Manipulation and hygiene: with modified independence;sitting/lateral leans;sit to/from stand  ?Plan Discharge plan remains appropriate;Frequency remains appropriate   ? ?Co-evaluation ? ? ?   ?  ?  ?  ?  ? ?  ?AM-PAC OT "6 Clicks" Daily Activity     ?Outcome Measure ? ? Help from another person eating meals?: A Little ?Help from another person taking care of personal grooming?: A Little ?Help from another person toileting, which includes using toliet, bedpan, or urinal?: A Little ?Help from another person bathing (including washing,  rinsing, drying)?: A Lot ?Help from another person to put on and taking off regular upper body clothing?: A Little ?Help from another person to put on and taking off regular lower body clothing?: A Little ?6 Click Score: 17 ? ?  ?End of Session Equipment Utilized During Treatment: Rollator (4 wheels) ? ?OT Visit Diagnosis: Unsteadiness on feet (R26.81);Other abnormalities of gait and mobility (R26.89);Muscle weakness (generalized) (M62.81) ?  ?Activity Tolerance Patient tolerated treatment well ?  ?Patient Left in bed;with call bell/phone within reach;with bed alarm set;with family/visitor present;with nursing/sitter in room ?  ?Nurse Communication Mobility status ?  ? ?   ? ?Time: 2683-4196 ?OT Time Calculation (min): 38 min ? ?Charges: OT General Charges ?$OT Visit: 1 Visit ?OT Treatments ?$Self Care/Home Management : 38-52 mins ? ?Lynnda Child, OTD, OTR/L ?Acute Rehab ?[phone] ? ? ?Kaylyn Lim ?05/20/2021, 5:03 PM ?

## 2021-05-20 NOTE — Progress Notes (Signed)
SATURATION QUALIFICATIONS: (This note is used to comply with regulatory documentation for home oxygen) ? ?Patient Saturations on Room Air at Rest (in Semi-reclined position) = 86-87% ? ?Patient Saturations on Room Air while Ambulating = 90-91% ? ?Patient Saturations on 2 Liters of oxygen while in semi-reclined position= 96% ? ?Please briefly explain why patient needs home oxygen: pt desatting to 86-87% on RA in semi-reclined position in bed ?

## 2021-05-20 NOTE — Assessment & Plan Note (Signed)
Unsure of etiology. Some atelectasis on imaging. Patient now requiring 2 L/min of oxygen. ?-Discharge with home oxygen ?

## 2021-05-20 NOTE — Progress Notes (Signed)
Physical Therapy Treatment ?Patient Details ?Name: Deborah Tucker ?MRN: [id number] ?DOB: 02/20/1930 ?Today's Date: 05/20/2021 ? ? ?History of Present Illness 88 y.o. female who presented to the ED from cancer center with concerns of dragging L foot during gait. MRI revealed multifocal small strokes in separate vascular territories. PMH:  anemia, B12 deficiency, CRVO OS, glaucoma OD, HLD, HTN, CKD, migraines, OA, OSA, prior fall with skull fx and concussion ? ?  ?PT Comments  ? ? Pt with lethargy and fatigue today. Pt received without O2 on and was found to have SpO2 of 83% on RA. Pt requiring more assist today and demonstrates decreased activity tolerance but continues to give good effort. Pt also with noted L sided inattention today and decreased use of L UE functionally requiring max tactile and verbal cues to use. Aware pt was just diagnosed with aggressive form of leukemia and unsure of future medical plan. PT to cont to follow unless directed otherwise by family and/or medical team. ?   ?Recommendations for follow up therapy are one component of a multi-disciplinary discharge planning process, led by the attending physician.  Recommendations may be updated based on patient status, additional functional criteria and insurance authorization. ? ?Follow Up Recommendations ? Home health PT (will continue to assess as medical plan is decided as pt recently diagnosed with aggressive form of leukemia) ?  ?  ?Assistance Recommended at Discharge Frequent or constant Supervision/Assistance  ?Patient can return home with the following A little help with walking and/or transfers;A little help with bathing/dressing/bathroom;Assistance with cooking/housework;Direct supervision/assist for medications management;Direct supervision/assist for financial management;Assist for transportation;Help with stairs or ramp for entrance ?  ?Equipment Recommendations ? None recommended by PT  ?  ?Recommendations for Other Services   ? ? ?   ?Precautions / Restrictions Precautions ?Precautions: Fall ?Restrictions ?Weight Bearing Restrictions: No  ?  ? ?Mobility ? Bed Mobility ?Overal bed mobility: Needs Assistance ?Bed Mobility: Supine to Sit ?  ?  ?Supine to sit: Mod assist, HOB elevated ?  ?  ?General bed mobility comments: HOB elevated, max tactile and verbal cues to complete task, slow to move, limited use of L UE requiring tactile cues to use, modA to scoot hips to EOB, pt saturated in urine ?  ? ?Transfers ?Overall transfer level: Needs assistance ?Equipment used: Rollator (4 wheels) (rollator from home) ?Transfers: Sit to/from Stand ?Sit to Stand: Min assist ?  ?  ?  ?  ?  ?General transfer comment: max verbal cues to push up from bed not pull up from walker, pt required minA to power up from low commode in bathroom with use of L hand rail, pt continues to be slow to move ?  ? ?Ambulation/Gait ?Ambulation/Gait assistance: Min assist ?Gait Distance (Feet): 10 Feet (x2, 30x1) ?Assistive device: Rolling walker (2 wheels) ?Gait Pattern/deviations: Step-through pattern, Decreased stride length ?Gait velocity: dec ?Gait velocity interpretation: <1.31 ft/sec, indicative of household ambulator ?  ?General Gait Details: pt slow to move, short step length, minA for walker management due to rollator with 4 wheels and harder to control from safety  stand point, max directional verbal cues to turn to safely enter bathroom and to turn and sit in the chair ? ? ?Stairs ?  ?  ?  ?  ?  ? ? ?Wheelchair Mobility ?  ? ?Modified Rankin (Stroke Patients Only) ?Modified Rankin (Stroke Patients Only) ?Pre-Morbid Rankin Score: No significant disability ?Modified Rankin: Moderate disability ? ? ?  ?Balance Overall balance assessment: Needs assistance ?Sitting-balance  support: No upper extremity supported, Feet supported ?Sitting balance-Leahy Scale: Fair ?Sitting balance - Comments: pt with slumped sitting position but no lean in any direction ?  ?Standing balance  support: During functional activity, Bilateral upper extremity supported, Reliant on assistive device for balance ?Standing balance-Leahy Scale: Poor ?Standing balance comment: dependent on RW ?  ?  ?  ?  ?  ?  ?  ?  ?  ?  ?  ?  ? ?  ?Cognition Arousal/Alertness: Awake/alert ?Behavior During Therapy: Flat affect ?Overall Cognitive Status: History of cognitive impairments - at baseline ?  ?  ?  ?  ?  ?  ?  ?  ?  ?  ?  ?  ?  ?  ?  ?  ?General Comments: pt slow to respond especially with use of L UE and LE, requiring tactile cues and multliple verbal cues to complete task ?  ?  ? ?  ?Exercises   ? ?  ?General Comments General comments (skin integrity, edema, etc.): pt received supine in bed without O2 on and saturated in urine. At end of PT session pt reports feeling whoozy. SpO2 found to be 83% on RA, per RN pt should be on 2LO2 via [place], 2LO2 placed on pt and SpO2 returned to 95% within a minute. pt was assisted to the bathroom, pt dependet on PT to pull done soiled depends and maxi pad, pt was handed a warm wash cloth and was able to perform pericare in the front but dependent for backside pericare in standing ?  ?  ? ?Pertinent Vitals/Pain Pain Assessment ?Pain Assessment: No/denies pain  ? ? ?Home Living   ?  ?  ?  ?  ?  ?  ?  ?  ?  ?   ?  ?Prior Function    ?  ?  ?   ? ?PT Goals (current goals can now be found in the care plan section) Acute Rehab PT Goals ?Patient Stated Goal: [facility] to [facility] ?PT Goal Formulation: With patient/family ?Time For Goal Achievement: 05/29/21 ?Potential to Achieve Goals: Good ?Progress towards PT goals: Progressing toward goals ? ?  ?Frequency ? ? ? Min 3X/week ? ? ? ?  ?PT Plan Current plan remains appropriate  ? ? ?Co-evaluation   ?  ?  ?  ?  ? ?  ?AM-PAC PT "6 Clicks" Mobility   ?Outcome Measure ? Help needed turning from your back to your side while in a flat bed without using bedrails?: A Little ?Help needed moving from lying on your back to sitting on the side of a flat bed  without using bedrails?: A Little ?Help needed moving to and from a bed to a chair (including a wheelchair)?: A Little ?Help needed standing up from a chair using your arms (e.g., wheelchair or bedside chair)?: A Little ?Help needed to walk in hospital room?: A Lot ?Help needed climbing 3-5 steps with a railing? : A Lot ?6 Click Score: 16 ? ?  ?End of Session Equipment Utilized During Treatment: Gait belt ?Activity Tolerance: Patient limited by fatigue ?Patient left: with call bell/phone within reach;with family/visitor present;in chair ?Nurse Communication: Mobility status ?PT Visit Diagnosis: Other abnormalities of gait and mobility (R26.89) ?  ? ? ?Time: 1027-2536 ?PT Time Calculation (min) (ACUTE ONLY): 32 min ? ?Charges:  $Gait Training: 8-22 mins ?$Therapeutic Activity: 8-22 mins          ?          ? ?  Kittie Plater, PT, DPT ?Acute Rehabilitation Services ?Secure chat preferred ?Office #: [phone] ? ? ? ?Aleksia Freiman M Timira Bieda ?05/20/2021, 10:00 AM ? ?

## 2021-05-20 NOTE — Progress Notes (Signed)
OT Cancellation Note ? ?Patient Details ?Name: Deborah Tucker ?MRN: [id number] ?DOB: 27-Aug-1930 ? ? ?Cancelled Treatment:    Reason Eval/Treat Not Completed: Patient at procedure or test/ unavailable (pt at x-ray, will follow up as schedule allows) ? ?Lynnda Child, OTD, OTR/L ?Acute Rehab ?[phone] ? ? ?Kaylyn Lim ?05/20/2021, 3:02 PM ?

## 2021-05-21 DIAGNOSIS — E785 Hyperlipidemia, unspecified: Secondary | ICD-10-CM | POA: Diagnosis not present

## 2021-05-21 DIAGNOSIS — Z736 Limitation of activities due to disability: Secondary | ICD-10-CM | POA: Diagnosis not present

## 2021-05-21 DIAGNOSIS — Z79899 Other long term (current) drug therapy: Secondary | ICD-10-CM | POA: Diagnosis not present

## 2021-05-21 DIAGNOSIS — D630 Anemia in neoplastic disease: Secondary | ICD-10-CM | POA: Diagnosis not present

## 2021-05-21 DIAGNOSIS — D61818 Other pancytopenia: Secondary | ICD-10-CM | POA: Diagnosis not present

## 2021-05-21 DIAGNOSIS — D696 Thrombocytopenia, unspecified: Secondary | ICD-10-CM | POA: Diagnosis not present

## 2021-05-21 DIAGNOSIS — E538 Deficiency of other specified B group vitamins: Secondary | ICD-10-CM | POA: Diagnosis not present

## 2021-05-21 DIAGNOSIS — I10 Essential (primary) hypertension: Secondary | ICD-10-CM | POA: Diagnosis not present

## 2021-05-21 DIAGNOSIS — Z8673 Personal history of transient ischemic attack (TIA), and cerebral infarction without residual deficits: Secondary | ICD-10-CM | POA: Diagnosis not present

## 2021-05-21 DIAGNOSIS — R531 Weakness: Secondary | ICD-10-CM | POA: Diagnosis not present

## 2021-05-21 DIAGNOSIS — J9601 Acute respiratory failure with hypoxia: Secondary | ICD-10-CM | POA: Diagnosis not present

## 2021-05-21 DIAGNOSIS — M419 Scoliosis, unspecified: Secondary | ICD-10-CM | POA: Diagnosis not present

## 2021-05-21 DIAGNOSIS — G4733 Obstructive sleep apnea (adult) (pediatric): Secondary | ICD-10-CM | POA: Diagnosis not present

## 2021-05-21 DIAGNOSIS — C9200 Acute myeloblastic leukemia, not having achieved remission: Secondary | ICD-10-CM | POA: Diagnosis not present

## 2021-05-21 DIAGNOSIS — R5383 Other fatigue: Secondary | ICD-10-CM | POA: Diagnosis not present

## 2021-05-21 DIAGNOSIS — L120 Bullous pemphigoid: Secondary | ICD-10-CM | POA: Diagnosis not present

## 2021-05-21 LAB — LUPUS ANTICOAGULANT PANEL
DRVVT: 45.2 s (ref 0.0–47.0)
PTT Lupus Anticoagulant: 50.8 s — ABNORMAL HIGH (ref 0.0–43.5)

## 2021-05-21 LAB — HEXAGONAL PHASE PHOSPHOLIPID: Hexagonal Phase Phospholipid: 6 s (ref 0–11)

## 2021-05-21 LAB — PTT-LA MIX: PTT-LA Mix: 51.9 s — ABNORMAL HIGH (ref 0.0–40.5)

## 2021-05-21 NOTE — NC FL2 (Signed)
?[place] MEDICAID FL2 LEVEL OF CARE SCREENING TOOL  ?  ? ?IDENTIFICATION  ?Patient Name: ?Deborah Tucker Birthdate: 09-25-1930 Sex: female Admission Date (Current Location): ?05/14/2021  ?[place] and [place] Number: ? [place] ?  Facility and Address:  ?[facility], [place] [address] [place], [place] [address] ?     Provider Number: ?[id number]  ?Attending Physician Name and Address:  ?Mariel Aloe, MD ? Relative Name and Phone Number:  ?  ?   ?Current Level of Care: ?Hospital Recommended Level of Care: ?[place] Prior Approval Number: ?  ? ?Date Approved/Denied: ?  PASRR Number: ?[id number] A ? ?Discharge Plan: ?SNF ?  ? ?Current Diagnoses: ?Patient Active Problem List  ? Diagnosis Date Noted  ? AML (acute myeloid leukemia) ([place]) 05/20/2021  ? Acute respiratory failure with hypoxia ([place]) 05/20/2021  ? Hypoxia 05/17/2021  ? Depression 05/15/2021  ? Hypothyroidism 05/15/2021  ? Pancytopenia ([place]) 05/15/2021  ? Acute CVA (cerebrovascular accident) ([place]) 05/14/2021  ? Thrombocytopenia ([place]) 05/14/2021  ? Normocytic anemia 05/14/2021  ? Chronic kidney disease, stage 3a ([place]) 05/14/2021  ? Atypical chest pain 10/08/2016  ? Fatigue 10/05/2016  ? Dyspnea on exertion 10/05/2016  ? Family history of coronary artery disease 10/05/2016  ? Essential hypertension 10/05/2016  ? HLD (hyperlipidemia) 10/05/2016  ? Exercise intolerance 10/05/2016  ? ? ?Orientation RESPIRATION BLADDER Height & Weight   ?  ?Self, Situation, Place ? O2 (at night, 2L) Continent Weight: 139 lb 3.2 oz (63.1 kg) ?Height:  5' 3.5" (161.3 cm)  ?BEHAVIORAL SYMPTOMS/MOOD NEUROLOGICAL BOWEL NUTRITION STATUS  ?    Continent Diet (heart healthy)  ?AMBULATORY STATUS COMMUNICATION OF NEEDS Skin   ?Limited Assist Verbally Surgical wounds (closed lower back, gauze dressing) ?  ?  ?  ?    ?     ?     ? ? ?Personal Care Assistance Level of Assistance  ?Bathing, Feeding, Dressing Bathing Assistance: Limited assistance ?Feeding  assistance: Limited assistance ?Dressing Assistance: Limited assistance ?   ? ?Functional Limitations Info  ?Sight Sight Info: Impaired ?  ?   ? ? ?SPECIAL CARE FACTORS FREQUENCY  ?PT (By licensed PT), OT (By licensed OT)   ?  ?PT Frequency: 5x/wk ?OT Frequency: 5x/wk ?  ?  ?  ?   ? ? ?Contractures Contractures Info: Not present  ? ? ?Additional Factors Info  ?Code Status, Allergies Code Status Info: DNR ?Allergies Info: Penicillins ?  ?  ?  ?   ? ?Current Medications (05/21/2021):  This is the current hospital active medication list ?Current Facility-Administered Medications  ?Medication Dose Route Frequency Provider Last Rate Last Admin  ? acetaminophen (TYLENOL) tablet 650 mg  650 mg Oral Q4H PRN Charlynne Cousins, MD      ? Or  ? acetaminophen (TYLENOL) 160 MG/5ML solution 650 mg  650 mg Per Tube Q4H PRN Charlynne Cousins, MD      ? Or  ? acetaminophen (TYLENOL) suppository 650 mg  650 mg Rectal Q4H PRN Charlynne Cousins, MD      ? ezetimibe (ZETIA) tablet 10 mg  10 mg Oral Daily Charlynne Cousins, MD   10 mg at 05/20/21 0915  ? galantamine (RAZADYNE ER) 24 hr capsule 16 mg  16 mg Oral Daily Charlynne Cousins, MD   16 mg at 05/20/21 0915  ? latanoprost (XALATAN) 0.005 % ophthalmic solution 1 drop  1 drop Both Eyes Daily Charlynne Cousins, MD   1 drop at  05/20/21 0915  ? levothyroxine (SYNTHROID) tablet 100 mcg  100 mcg Oral Once per day on Sat Charlynne Cousins, MD   100 mcg at 05/16/21 8119  ? levothyroxine (SYNTHROID) tablet 50 mcg  50 mcg Oral Once per day on Sun Mon Tue Wed Thu Fri Charlynne Cousins, MD   50 mcg at 05/21/21 [phone]  ? memantine (NAMENDA) tablet 10 mg  10 mg Oral BID Charlynne Cousins, MD   10 mg at 05/20/21 2155  ? metoprolol succinate (TOPROL-XL) 24 hr tablet 25 mg  25 mg Oral BID Charlynne Cousins, MD   25 mg at 05/20/21 2155  ? timolol (TIMOPTIC) 0.5 % ophthalmic solution 1 drop  1 drop Both Eyes BID Charlynne Cousins, MD   1 drop at 05/20/21 2155  ? traMADol  (ULTRAM) tablet 50 mg  50 mg Oral TID PRN Charlynne Cousins, MD      ? ? ? ?Discharge Medications: ?Please see discharge summary for a list of discharge medications. ? ?Relevant Imaging Results: ? ?Relevant Lab Results: ? ? ?Additional Information ?SS#: [id number] ? ?Geralynn Ochs, LCSW ? ? ? ? ?

## 2021-05-21 NOTE — Plan of Care (Signed)
Pt ready for discharge. Daughters at bedside. Information packet given. Education given. No questions or concerns. ? ?Problem: Education: ?Goal: Knowledge of disease or condition will improve ?Outcome: Adequate for Discharge ?Goal: Knowledge of secondary prevention will improve (SELECT ALL) ?Outcome: Adequate for Discharge ?Goal: Knowledge of patient specific risk factors will improve (INDIVIDUALIZE FOR PATIENT) ?Outcome: Adequate for Discharge ?Goal: Individualized Educational Video(s) ?Outcome: Adequate for Discharge ?  ?Problem: Coping: ?Goal: Will verbalize positive feelings about self ?Outcome: Adequate for Discharge ?Goal: Will identify appropriate support needs ?Outcome: Adequate for Discharge ?  ?Problem: Health Behavior/Discharge Planning: ?Goal: Ability to manage health-related needs will improve ?Outcome: Adequate for Discharge ?  ?Problem: Self-Care: ?Goal: Ability to participate in self-care as condition permits will improve ?Outcome: Adequate for Discharge ?Goal: Verbalization of feelings and concerns over difficulty with self-care will improve ?Outcome: Adequate for Discharge ?Goal: Ability to communicate needs accurately will improve ?Outcome: Adequate for Discharge ?  ?Problem: Nutrition: ?Goal: Risk of aspiration will decrease ?Outcome: Adequate for Discharge ?Goal: Dietary intake will improve ?Outcome: Adequate for Discharge ?  ?Problem: Intracerebral Hemorrhage Tissue Perfusion: ?Goal: Complications of Intracerebral Hemorrhage will be minimized ?Outcome: Adequate for Discharge ?  ?Problem: Ischemic Stroke/TIA Tissue Perfusion: ?Goal: Complications of ischemic stroke/TIA will be minimized ?Outcome: Adequate for Discharge ?  ?Problem: Spontaneous Subarachnoid Hemorrhage Tissue Perfusion: ?Goal: Complications of Spontaneous Subarachnoid Hemorrhage will be minimized ?Outcome: Adequate for Discharge ?  ?Problem: Acute Rehab PT Goals(only PT should resolve) ?Goal: Patient Will Transfer Sit To/From  Stand ?Outcome: Adequate for Discharge ?Goal: Pt Will Ambulate ?Outcome: Adequate for Discharge ?  ?Problem: Acute Rehab OT Goals (only OT should resolve) ?Goal: Pt. Will Perform Upper Body Dressing ?Outcome: Adequate for Discharge ?Goal: Pt. Will Perform Lower Body Dressing ?Outcome: Adequate for Discharge ?Goal: Pt. Will Transfer To Toilet ?Outcome: Adequate for Discharge ?Goal: Pt. Will Perform Toileting-Clothing Manipulation ?Outcome: Adequate for Discharge ?  ?Problem: Education: ?Goal: Knowledge of General Education information will improve ?Description: Including pain rating scale, medication(s)/side effects and non-pharmacologic comfort measures ?Outcome: Adequate for Discharge ?  ?Problem: Health Behavior/Discharge Planning: ?Goal: Ability to manage health-related needs will improve ?Outcome: Adequate for Discharge ?  ?Problem: Clinical Measurements: ?Goal: Ability to maintain clinical measurements within normal limits will improve ?Outcome: Adequate for Discharge ?Goal: Will remain free from infection ?Outcome: Adequate for Discharge ?Goal: Diagnostic test results will improve ?Outcome: Adequate for Discharge ?Goal: Respiratory complications will improve ?Outcome: Adequate for Discharge ?Goal: Cardiovascular complication will be avoided ?Outcome: Adequate for Discharge ?  ?Problem: Activity: ?Goal: Risk for activity intolerance will decrease ?Outcome: Adequate for Discharge ?  ?Problem: Nutrition: ?Goal: Adequate nutrition will be maintained ?Outcome: Adequate for Discharge ?  ?Problem: Coping: ?Goal: Level of anxiety will decrease ?Outcome: Adequate for Discharge ?  ?Problem: Elimination: ?Goal: Will not experience complications related to bowel motility ?Outcome: Adequate for Discharge ?Goal: Will not experience complications related to urinary retention ?Outcome: Adequate for Discharge ?  ?Problem: Pain Managment: ?Goal: General experience of comfort will improve ?Outcome: Adequate for Discharge ?   ?Problem: Safety: ?Goal: Ability to remain free from injury will improve ?Outcome: Adequate for Discharge ?  ?Problem: Skin Integrity: ?Goal: Risk for impaired skin integrity will decrease ?Outcome: Adequate for Discharge ?  ?

## 2021-05-21 NOTE — Discharge Summary (Signed)
?Physician Discharge Summary ?  ?Patient: Deborah Tucker MRN: [id number] DOB: 03-29-1930  ?Admit date:     05/14/2021  ?Discharge date: 05/21/21  ?Discharge Physician: Cordelia Poche, MD  ? ?PCP: Crist Infante, MD  ? ?Recommendations at discharge:  ?Follow-up with neurology as an outpatient ?Follow-up with hematology at [facility] ? ?Discharge Diagnoses: ?Principal Problem: ?  Acute CVA (cerebrovascular accident) ([place]) ?Active Problems: ?  Pancytopenia ([place]) ?  Chronic kidney disease, stage 3a ([place]) ?  Essential hypertension ?  HLD (hyperlipidemia) ?  Depression ?  Thrombocytopenia ([place]) ?  Normocytic anemia ?  Hypothyroidism ?  Hypoxia ?  AML (acute myeloid leukemia) ([place]) ?  Acute respiratory failure with hypoxia ([place]) ? ?Resolved Problems: ?  * No resolved hospital problems. * ? ?Hospital Course: ?PMH of HTN, CRAO, CVA, bicytopenia, B12 deficiency. ?Presents to the hematology office was found to have unilateral left-sided weakness. ?Identified to have acute CVA as well as worsening pancytopenia. ?Neurology and hematology consulted. 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 ? ?Assessment and Plan: ?* Acute CVA (cerebrovascular accident) ([place]) ?Patient presented with left sided weakness. MRI confirms multiple acute infarcts located in a scattered pattern within the supratentorial brain. CTA head and neck negative for large vessel occlusion or significant stenosis. LDL of 10. Hemoglobin A1C of 6.8%. Transthoracic Echocardiogram significant for no thrombus or mention of atrial level shunt. Neurology recommendations for no antithrombotic regimen secondary to severe thrombocytopenia. PT/OT recommendations for home health therapy. Patient discharged on no new medications. Recommendation for Neurology follow-up in 4 weeks. ? ?Pancytopenia ([place]) ?Hematology consulted. Patient treated with IVIG and has received 2 units of platelets via transfusion. Bone marrow biopsy consistent with AM. Hypercoagulable workup unremarkable.  Platelets of 22,000 prior to admission. No evidence of bleeding. Recommendation for outpatient transfusion as recommended by outpatient oncologist. ? ?Chronic kidney disease, stage 3a ([place]) ?Renal function stable at baseline. ?Monitor.  Avoid nephrotoxic medications. ? ?Essential hypertension ?Blood pressure controlled.   ?On Toprol. We will continue.   ?On Benicar, hydralazine, Lasix currently on hold. ? ?HLD (hyperlipidemia) ?On Zetia.  LDL is actually 10.  Also on Repatha.  Continue. ? ?Depression ?On Lexapro.  Continue. ? ?Acute respiratory failure with hypoxia ([place]) ?Unsure of etiology. Some atelectasis on imaging. Patient now requiring 2 L/min of oxygen. ?-Discharge with home oxygen ? ?AML (acute myeloid leukemia) ([place]) ?Newly diagnosed. Plan for quick outpatient follow-up with medical oncology at [facility]. ? ?Hypoxia ?Mild.  Currently on 2 L of oxygen.  Add incentive spirometry. ?Currently saturating 90% on room air. ? ?Hypothyroidism ?Continue Synthroid as ordered. ? ? ?Consultants: Neurology, Hematology/Oncology ?Procedures performed: Transthoracic Echocardiogram, bone marrow biopsy  ?Disposition: Assisted living ?Diet recommendation:  ?Cardiac diet ? ?DISCHARGE MEDICATION: ?Allergies as of 05/21/2021   ? ?   Reactions  ? Penicillins Hives  ? ?  ? ?  ?Medication List  ?  ? ?STOP taking these medications   ? ?clopidogrel 75 MG tablet ?Commonly known as: PLAVIX ?  ?traMADol 50 MG tablet ?Commonly known as: ULTRAM ?  ? ?  ? ?TAKE these medications   ? ?acetaminophen 500 MG tablet ?Commonly known as: TYLENOL ?Take 1,000 mg by mouth in the morning, at noon, and at bedtime. And as needed for pain ?  ?Calcium 600+D3 Plus Minerals 600-800 MG-UNIT Tabs ?Take 1 tablet by mouth in the morning. ?  ?EPINEPHrine 0.3 mg/0.3 mL Soaj injection ?Commonly known as: EPI-PEN ?Inject 0.3 mg into the muscle once as needed for anaphylaxis. ?  ?  escitalopram 5 MG tablet ?Commonly known as: LEXAPRO ?Take 5 mg by mouth  every evening. ?  ?estrogens (conjugated) 0.3 MG tablet ?Commonly known as: PREMARIN ?Take 0.3 mg by mouth daily. ?  ?Evolocumab 140 MG/ML Soaj ?Inject 140 mg into the skin every 14 (fourteen) days. ?  ?ezetimibe 10 MG tablet ?Commonly known as: ZETIA ?Take 10 mg by mouth daily. ?  ?furosemide 20 MG tablet ?Commonly known as: LASIX ?Take 20 mg by mouth See admin instructions. Take 20 mg once daily on Tue, Fri ?  ?galantamine 16 MG 24 hr capsule ?Commonly known as: RAZADYNE ER ?Take 16 mg by mouth daily. ?  ?hydrALAZINE 25 MG tablet ?Commonly known as: APRESOLINE ?Take 25 mg by mouth 2 (two) times daily. ?  ?ICaps Areds 2 Caps ?Take 1 capsule by mouth 2 (two) times daily. ?  ?latanoprost 0.005 % ophthalmic solution ?Commonly known as: XALATAN ?Place 1 drop into both eyes at bedtime. ?  ?levothyroxine 50 MCG tablet ?Commonly known as: SYNTHROID ?Take 50 mcg by mouth See admin instructions. Take 50 mcg on Sun, Mon, Tue, Wed, Thu, Fri and 100 mcg on Saturday. ?  ?melatonin 3 MG Tabs tablet ?Take 3 mg by mouth at bedtime. ?  ?memantine 10 MG tablet ?Commonly known as: NAMENDA ?Take 10 mg by mouth 2 (two) times daily. ?  ?metoprolol succinate 50 MG 24 hr tablet ?Commonly known as: TOPROL-XL ?Take 25 mg by mouth 2 (two) times daily. ?  ?olmesartan 40 MG tablet ?Commonly known as: BENICAR ?Take 40 mg by mouth every evening. ?  ?pantoprazole 40 MG tablet ?Commonly known as: PROTONIX ?Take 40 mg by mouth daily. ?  ?[place] ?Take 1 capsule by mouth daily. ?  ?timolol 0.5 % ophthalmic solution ?Commonly known as: TIMOPTIC ?Place 1 drop into both eyes 2 (two) times daily. ?  ?vitamin B-12 1000 MCG tablet ?Commonly known as: CYANOCOBALAMIN ?Take 1,000 mcg by mouth daily. ?  ?Vitamin C 500 MG Chew ?Chew 500 mg by mouth in the morning. ?  ?zinc oxide 20 % ointment ?Apply 1 application. topically daily as needed for irritation. To buttocks after every incontinent episode and as needed. ?  ? ?  ? ?  ?  ? ? ?   ?Durable Medical Equipment  ?(From admission, onward)  ?  ? ? ?  ? ?  Start     Ordered  ? 05/20/21 1300  For home use only DME oxygen  Once       ?Question Answer Comment  ?Length of Need 6 Months   ?Mode or (Route) Nasal cannula   ?Liters per Minute 2   ?Frequency Continuous (stationary and portable oxygen unit needed)   ?Oxygen delivery system Gas   ?  ? 05/20/21 1300  ? ?  ?  ? ?  ? ? Follow-up Information   ? ? Art Buff, MD. Go on 05/21/2021.   ?Specialty: Hematology and Oncology ?Why: For hospital follow-up ?Contact information: ?[address] ?[place] [address] ?[phone] ? ? ?  ?  ? ? Garvin Fila, MD. Schedule an appointment as soon as possible for a visit in 4 week(s).   ?Specialties: Neurology, Radiology ?Why: For hospital follow-up ?Contact information: ?[address] ?[address] ?[place] [address] ?[phone] ? ? ?  ?  ? ?  ?  ? ?  ? ?Discharge Exam: ?BP (!) 158/73 (BP Location: Right Arm)   Pulse 81   Temp 98.7 ?F (37.1 ?C) (  Oral)   Resp 16   Ht 5' 3.5" (1.613 m)   Wt 63.1 kg   SpO2 96%   BMI 24.27 kg/m?  ? ?General exam: Appears calm and comfortable ?Respiratory system: Clear to auscultation. Respiratory effort normal. ?Cardiovascular system: S1 & S2 heard, RRR. 2/6 systolic murmur ? ?Condition at discharge: stable ? ?The results of significant diagnostics from this hospitalization (including imaging, microbiology, ancillary and laboratory) are listed below for reference.  ? ?Imaging Studies: ?CT ANGIO HEAD NECK W WO CM ? ?Result Date: 05/14/2021 ?CLINICAL DATA:  Acute neuro deficit.  Stroke. EXAM: CT ANGIOGRAPHY HEAD AND NECK TECHNIQUE: Multidetector CT imaging of the head and neck was performed using the standard protocol during bolus administration of intravenous contrast. Multiplanar CT image reconstructions and MIPs were obtained to evaluate the vascular anatomy. Carotid stenosis measurements (when applicable) are obtained utilizing NASCET criteria,  using the distal internal carotid diameter as the denominator. RADIATION DOSE REDUCTION: This exam was performed according to the departmental dose-optimization program which includes automated exposure control, adjustm

## 2021-05-21 NOTE — TOC Initial Note (Signed)
Transition of Care (TOC) - Initial/Assessment Note  ? ? ?Patient Details  ?Name: Deborah Tucker ?MRN: [id number] ?Date of Birth: [birth year] ? ?Transition of Care [facility]) CM/SW Contact:    ?Geralynn Ochs, LCSW ?Phone Number: ?05/21/2021, 9:54 AM ? ?Clinical Narrative:           CSW contacted [facility] to discuss patient's status and ALF vs SNF admission upon return. CSW spoke with both Museum/gallery conservator at [place] and [place] with [facility], and patient will admit to [facility] upon return to [place] due to increased medical concerns at this time. CSW alerted by RN later in the day that family a bedside was asking about discharge for outpatient oncology appointment tomorrow. CSW coordinated with family and [facility] to ensure that patient can discharge from hospital to make it to the outpatient appointment and admit to SNF. CSW updated MD and RN of plan for tomorrow discharge. CSW to follow.      ? ? ?Expected Discharge Plan: [place] ?Barriers to Discharge: Continued Medical Work up ? ? ?Patient Goals and CMS Choice ?Patient states their goals for this hospitalization and ongoing recovery are:: patient unable to participate in goal setting, not fully oriented ?CMS Medicare.gov Compare Post Acute Care list provided to:: Patient Represenative (must comment) ?Choice offered to / list presented to : Adult Children ? ?Expected Discharge Plan and Services ?Expected Discharge Plan: [place] ?  ?  ?Post Acute Care Choice: [place] ?Living arrangements for the past 2 months: [employer] ?Expected Discharge Date: 05/21/21               ?  ?  ?  ?  ?  ?  ?  ?  ?  ?  ? ?Prior Living Arrangements/Services ?Living arrangements for the past 2 months: [place] ?Lives with:: Facility Resident ?Patient language and need for interpreter reviewed:: No ?Do you feel safe going back to the place where you live?: Yes       ?Need for Family Participation in Patient Care: Yes (Comment) ?Care giver support system in place?: Yes (comment) ?Current home services: DME ?Criminal Activity/Legal Involvement Pertinent to Current Situation/Hospitalization: No - Comment as needed ? ?Activities of Daily Living ?  ?  ? ?Permission Sought/Granted ?Permission sought to share information with : Facility Sport and exercise psychologist, Family Supports ?Permission granted to share information with : Yes, Verbal Permission Granted ? Share Information with NAME: Wayna Chalet ? Permission granted to share info w AGENCY: [facility] ? Permission granted to share info w Relationship: Daughters ?   ? ?Emotional Assessment ?Appearance:: Appears stated age ?Attitude/Demeanor/Rapport: Unable to Assess ?Affect (typically observed): Unable to Assess ?Orientation: : Oriented to Self, Oriented to Place, Oriented to Situation ?Alcohol / Substance Use: Not Applicable ?Psych Involvement: No (comment) ? ?Admission diagnosis:  Embolic stroke [facility]) [T70.0] ?Brain lesion [G93.9] ?Patient Active Problem List  ? Diagnosis Date Noted  ? AML (acute myeloid leukemia) ([place]) 05/20/2021  ? Acute respiratory failure with hypoxia ([place]) 05/20/2021  ? Hypoxia 05/17/2021  ? Depression 05/15/2021  ? Hypothyroidism 05/15/2021  ? Pancytopenia ([place]) 05/15/2021  ? Acute CVA (cerebrovascular accident) ([place]) 05/14/2021  ? Thrombocytopenia ([place]) 05/14/2021  ? Normocytic anemia 05/14/2021  ? Chronic kidney disease, stage 3a ([place]) 05/14/2021  ? Atypical chest pain 10/08/2016  ? Fatigue 10/05/2016  ? Dyspnea on exertion 10/05/2016  ? Family history of coronary artery disease 10/05/2016  ? Essential hypertension  10/05/2016  ? HLD (hyperlipidemia) 10/05/2016  ? Exercise intolerance 10/05/2016  ? ?PCP:  Crist Infante, MD ?Pharmacy:   ?[facility] ([place] ?[address] ?[address] ?[place] [address] ?Phone: [phone] Fax:  [phone] ? ? ? ? ?Social Determinants of Health (SDOH) Interventions ?  ? ?Readmission Risk Interventions ?   ? View : No data to display.  ?  ?  ?  ? ? ? ?

## 2021-05-21 NOTE — TOC Transition Note (Signed)
Transition of Care (TOC) - CM/SW Discharge Note ? ? ?Patient Details  ?Name: Deborah Tucker ?MRN: [id number] ?Date of Birth: 1930-10-19 ? ?Transition [facility] Care [facility]) CM/SW Contact:  ?Geralynn Ochs, LCSW ?Phone Number: ?05/21/2021, 9:56 AM ? ? ?Clinical Narrative:   CSW sent discharge information to [facility] and confirmed with Joellen Jersey that patient's room was ready. Family at bedside to provide transportation.  ? ?Nurse to call report to [phone], [place] Room 34 ? ? ? ?Final next level of care: [place] ?Barriers to Discharge: Barriers Resolved ? ? ?Patient Goals and CMS Choice ?Patient states their goals for this hospitalization and ongoing recovery are:: patient unable to participate in goal setting, not fully oriented ?CMS Medicare.gov Compare Post Acute Care list provided to:: Patient Represenative (must comment) ?Choice offered to / list presented to : Adult Children ? ?Discharge Placement ?  ?           ?Patient chooses bed at: [facility] ?Patient to be transferred to facility by: Family ?Name of family member notified: Wayna Chalet ?Patient and family notified of of transfer: 05/21/21 ? ?Discharge Plan and Services ?  ?  ?Post Acute Care Choice: [place]          ?  ?  ?  ?  ?  ?  ?  ?  ?  ?  ? ?Social Determinants of Health (SDOH) Interventions ?  ? ? ?Readmission Risk Interventions ?   ? View : No data to display.  ?  ?  ?  ? ? ? ? ? ?

## 2021-05-22 LAB — MULTIPLE MYELOMA PANEL, SERUM
Albumin SerPl Elph-Mcnc: 3.6 g/dL (ref 2.9–4.4)
Albumin/Glob SerPl: 1.1 (ref 0.7–1.7)
Alpha 1: 0.4 g/dL (ref 0.0–0.4)
Alpha2 Glob SerPl Elph-Mcnc: 1 g/dL (ref 0.4–1.0)
B-Globulin SerPl Elph-Mcnc: 1 g/dL (ref 0.7–1.3)
Gamma Glob SerPl Elph-Mcnc: 1 g/dL (ref 0.4–1.8)
Globulin, Total: 3.4 g/dL (ref 2.2–3.9)
IgA: 248 mg/dL (ref 64–422)
IgG (Immunoglobin G), Serum: 944 mg/dL (ref 586–1602)
IgM (Immunoglobulin M), Srm: 49 mg/dL (ref 26–217)
Total Protein ELP: 7 g/dL (ref 6.0–8.5)

## 2021-05-24 DIAGNOSIS — N184 Chronic kidney disease, stage 4 (severe): Secondary | ICD-10-CM | POA: Diagnosis not present

## 2021-05-24 DIAGNOSIS — G9340 Encephalopathy, unspecified: Secondary | ICD-10-CM | POA: Diagnosis not present

## 2021-05-24 DIAGNOSIS — G4733 Obstructive sleep apnea (adult) (pediatric): Secondary | ICD-10-CM | POA: Diagnosis not present

## 2021-05-24 DIAGNOSIS — E039 Hypothyroidism, unspecified: Secondary | ICD-10-CM | POA: Diagnosis not present

## 2021-05-24 DIAGNOSIS — F32A Depression, unspecified: Secondary | ICD-10-CM | POA: Diagnosis not present

## 2021-05-24 DIAGNOSIS — I672 Cerebral atherosclerosis: Secondary | ICD-10-CM | POA: Diagnosis not present

## 2021-05-24 DIAGNOSIS — C9200 Acute myeloblastic leukemia, not having achieved remission: Secondary | ICD-10-CM | POA: Diagnosis not present

## 2021-05-24 DIAGNOSIS — J9691 Respiratory failure, unspecified with hypoxia: Secondary | ICD-10-CM | POA: Diagnosis not present

## 2021-05-24 DIAGNOSIS — I69391 Dysphagia following cerebral infarction: Secondary | ICD-10-CM | POA: Diagnosis not present

## 2021-05-24 DIAGNOSIS — E785 Hyperlipidemia, unspecified: Secondary | ICD-10-CM | POA: Diagnosis not present

## 2021-05-24 DIAGNOSIS — I129 Hypertensive chronic kidney disease with stage 1 through stage 4 chronic kidney disease, or unspecified chronic kidney disease: Secondary | ICD-10-CM | POA: Diagnosis not present

## 2021-05-24 DIAGNOSIS — D61818 Other pancytopenia: Secondary | ICD-10-CM | POA: Diagnosis not present

## 2021-05-24 DIAGNOSIS — I69354 Hemiplegia and hemiparesis following cerebral infarction affecting left non-dominant side: Secondary | ICD-10-CM | POA: Diagnosis not present

## 2021-05-25 DIAGNOSIS — C9200 Acute myeloblastic leukemia, not having achieved remission: Secondary | ICD-10-CM | POA: Diagnosis not present

## 2021-05-25 DIAGNOSIS — I69354 Hemiplegia and hemiparesis following cerebral infarction affecting left non-dominant side: Secondary | ICD-10-CM | POA: Diagnosis not present

## 2021-05-25 DIAGNOSIS — I69391 Dysphagia following cerebral infarction: Secondary | ICD-10-CM | POA: Diagnosis not present

## 2021-05-25 DIAGNOSIS — D696 Thrombocytopenia, unspecified: Secondary | ICD-10-CM | POA: Diagnosis not present

## 2021-05-25 DIAGNOSIS — I672 Cerebral atherosclerosis: Secondary | ICD-10-CM | POA: Diagnosis not present

## 2021-05-25 DIAGNOSIS — G9340 Encephalopathy, unspecified: Secondary | ICD-10-CM | POA: Diagnosis not present

## 2021-05-25 DIAGNOSIS — D61818 Other pancytopenia: Secondary | ICD-10-CM | POA: Diagnosis not present

## 2021-05-26 ENCOUNTER — Encounter: Payer: Self-pay | Admitting: Internal Medicine

## 2021-05-26 ENCOUNTER — Non-Acute Institutional Stay: Payer: Medicare Other | Admitting: Internal Medicine

## 2021-05-26 DIAGNOSIS — G9340 Encephalopathy, unspecified: Secondary | ICD-10-CM | POA: Diagnosis not present

## 2021-05-26 DIAGNOSIS — D696 Thrombocytopenia, unspecified: Secondary | ICD-10-CM | POA: Diagnosis not present

## 2021-05-26 DIAGNOSIS — I69354 Hemiplegia and hemiparesis following cerebral infarction affecting left non-dominant side: Secondary | ICD-10-CM | POA: Diagnosis not present

## 2021-05-26 DIAGNOSIS — C9200 Acute myeloblastic leukemia, not having achieved remission: Secondary | ICD-10-CM | POA: Diagnosis not present

## 2021-05-26 DIAGNOSIS — D61818 Other pancytopenia: Secondary | ICD-10-CM

## 2021-05-26 DIAGNOSIS — I69391 Dysphagia following cerebral infarction: Secondary | ICD-10-CM | POA: Diagnosis not present

## 2021-05-26 DIAGNOSIS — I639 Cerebral infarction, unspecified: Secondary | ICD-10-CM

## 2021-05-26 DIAGNOSIS — I672 Cerebral atherosclerosis: Secondary | ICD-10-CM | POA: Diagnosis not present

## 2021-05-26 MED ORDER — LORAZEPAM 0.5 MG PO TABS: 0.5000 mg | ORAL_TABLET | ORAL | 0 refills | Status: AC | PRN

## 2021-05-26 NOTE — Progress Notes (Signed)
Provider:  Veleta Miners MD Location:   [facility] Room Number: 25 Place of Service:  ALF (25)  PCP: Crist Infante, MD Patient Care Team: Crist Infante, MD as PCP - General (Internal Medicine) Crist Infante, MD (Internal Medicine)  Extended Emergency Contact Information Primary Emergency Contact: Jeris Penta Address: [address]          [place] [address] Johnnette Litter of [place] Phone: [phone] Mobile Phone: [phone] Relation: Daughter Secondary Emergency Contact: Nault,Sharon Mobile Phone: [phone] Relation: Daughter  Code Status: DNR Goals of Care: Advanced Directive information    05/26/2021   10:29 AM  Advanced Directives  Does Patient Have a Medical Advance Directive? Yes  Type of Advance Directive Out of facility DNR (pink MOST or yellow form);Living will;Healthcare Power of Attorney  Does patient want to make changes to medical advance directive? No - Patient declined  Copy of [place] in Chart? Yes - validated most recent copy scanned in chart (See row information)  Pre-existing out of facility DNR order (yellow form or pink MOST form) Yellow form placed in chart (order not valid for inpatient use)      Chief Complaint  Patient presents with   New Admit To SNF    Admission to SNF    HPI: Patient is a 88 y.o. female seen today for admission to SNF for Hospice care  Was admitted in the hospital from 05/04-05/11 for Acute CVA and AML  Patient has h/o HTN, HLD, Past h/o CVA OSA scoliosis and Cognitive impairment Was getting work up for Thrombocytopenia   Presented to ED with Left sided Weakness MRI showed Acute Infarcts/ Metastatic disease Underwent Bone Marrow Biopsy and was diagnosed with AML Was seen in [facility] and was referred to hospice All her meds are discontinued Now only on Roxanol Daughter in room wants says she seems to get restless in the evening and unable to ask for  Roxanol Also Sweating and hot all the time Poor Appetite       Past Medical History:  Diagnosis Date   Anemia    B12 deficiency    Bullous pemphigoid    Central retinal vein occlusion, left eye    Left eye blind   Cerebrovascular disease, unspecified    MRI indicates multiple small cortical infarcts.   Concussion    H/o fall with Skull fracture & concussion   DJD (degenerative joint disease), lumbar    Glaucoma, right eye    Hyperlipidemia    Hypertension    IFG (impaired fasting glucose)    Migraine headache    OSA (obstructive sleep apnea)    Intolerant of CPAP. Is supposed to wear nocturnal oxygen, but does not   Osteoarthritis of left knee    Osteopenia    Scoliosis deformity of spine    Seasonal allergies    Skull fracture (HCC)    Wrist fracture 2015   R wrist   Past Surgical History:  Procedure Laterality Date   ANKLE FRACTURE SURGERY Left    NM MYOVIEW LTD  10/2016   Only exercised 3: 34 min.  4.6 METs normal heart rate response.  Baseline hypertension.  Normal EF  65-70%.  No regional wall motion.  No ischemia or infarction.  Poor exercise tolerance.  No chronotropic incompetence.   OVARIAN CYST REMOVAL     VAGINAL HYSTERECTOMY      reports that she has never smoked. She has never used smokeless tobacco. She reports that she does not  currently use alcohol. She reports that she does not use drugs. Social History   Socioeconomic History   Marital status: Widowed    Spouse name: Not on file   Number of children: 2   Years of education: 61   Highest education level: Not on file  Occupational History   Occupation: retired.  Tobacco Use   Smoking status: Never   Smokeless tobacco: Never  Vaping Use   Vaping Use: Never used  Substance and Sexual Activity   Alcohol use: Not Currently    Comment: 10 oz wine per night   Drug use: No   Sexual activity: Not Currently  Other Topics Concern   Not on file  Social History Narrative   ** Merged History  Encounter **       Widowed, then divorced and remarried in 2007 to [place] (-- he subsequently developed dementia) - now divorced from [place].  2 daughters - [name] (youngest, Lives in [place], [place]), [name] (older - lives in [place], [place]).  5 GC - (1 boy, 4 girls) 1    yr college, Retired. Worked as a Training and development officer.  1-2 glasses of wine/day.   Social Determinants of Health   Financial Resource Strain: Not on file  Food Insecurity: Not on file  Transportation Needs: Not on file  Physical Activity: Not on file  Stress: Not on file  Social Connections: Not on file  Intimate Partner Violence: Not on file    Functional Status Survey:    Family History  Problem Relation Age of Onset   Lung cancer Father    Heart disease Father    Hypertension Father    COPD Father    Dementia Sister    Seizures Sister    Heart attack Sister 81       Damage was to the back of her heart.   AAA (abdominal aortic aneurysm) Sister    Hypertension Daughter    Hypertension Sister    Diabetes type II Sister    Dementia Maternal Grandmother     Health Maintenance  Topic Date Due   COVID-19 Vaccine (1) Never done   Pneumonia Vaccine 32+ Years old (1 - PCV) Never done   DEXA SCAN  Never done   INFLUENZA VACCINE  08/11/2021   TETANUS/TDAP  01/30/2031   Zoster Vaccines- Shingrix  Completed   HPV VACCINES  Aged Out    Allergies  Allergen Reactions   Penicillins Hives    Allergies as of 05/26/2021       Reactions   Penicillins Hives        Medication List        Accurate as of May 26, 2021 10:29 AM. If you have any questions, ask your nurse or doctor.          acetaminophen 500 MG tablet Commonly known as: TYLENOL Take 1,000 mg by mouth in the morning, at noon, and at bedtime. And as needed for pain   Calcium 600+D3 Plus Minerals 600-800 MG-UNIT Tabs Take 1 tablet by mouth in the morning.   clopidogrel 75 MG tablet Commonly known as: PLAVIX Take 75 mg by mouth  daily.   EPINEPHrine 0.3 mg/0.3 mL Soaj injection Commonly known as: EPI-PEN Inject 0.3 mg into the muscle once as needed for anaphylaxis.   escitalopram 5 MG tablet Commonly known as: LEXAPRO Take 5 mg by mouth every evening.   estrogens (conjugated) 0.3 MG tablet Commonly known as: PREMARIN Take 0.3 mg by mouth daily.  Evolocumab 140 MG/ML Soaj Inject 140 mg into the skin every 14 (fourteen) days.   ezetimibe 10 MG tablet Commonly known as: ZETIA Take 10 mg by mouth daily.   furosemide 20 MG tablet Commonly known as: LASIX Take 20 mg by mouth See admin instructions. Take 20 mg once daily on Tue, Fri   galantamine 16 MG 24 hr capsule Commonly known as: RAZADYNE ER Take 16 mg by mouth daily.   hydrALAZINE 25 MG tablet Commonly known as: APRESOLINE Take 25 mg by mouth 2 (two) times daily.   ICaps Areds 2 Caps Take 1 capsule by mouth 2 (two) times daily.   latanoprost 0.005 % ophthalmic solution Commonly known as: XALATAN Place 1 drop into both eyes at bedtime.   levothyroxine 50 MCG tablet Commonly known as: SYNTHROID Take 50 mcg by mouth See admin instructions. Take 50 mcg on Sun, Mon, Tue, Wed, Thu, Fri and 100 mcg on Saturday.   melatonin 3 MG Tabs tablet Take 3 mg by mouth at bedtime.   memantine 10 MG tablet Commonly known as: NAMENDA Take 10 mg by mouth 2 (two) times daily.   metoprolol succinate 50 MG 24 hr tablet Commonly known as: TOPROL-XL Take 25 mg by mouth 2 (two) times daily.   olmesartan 40 MG tablet Commonly known as: BENICAR Take 40 mg by mouth every evening.   pantoprazole 40 MG tablet Commonly known as: PROTONIX Take 40 mg by mouth daily.   [facility] Caps Take 1 capsule by mouth daily.   timolol 0.5 % ophthalmic solution Commonly known as: TIMOPTIC Place 1 drop into both eyes 2 (two) times daily.   traMADol 50 MG tablet Commonly known as: ULTRAM Take 50 mg by mouth every 8 (eight) hours as needed.   vitamin B-12  1000 MCG tablet Commonly known as: CYANOCOBALAMIN Take 1,000 mcg by mouth daily.   Vitamin C 500 MG Chew Chew 500 mg by mouth in the morning.   zinc oxide 20 % ointment Apply 1 application. topically daily as needed for irritation. To buttocks after every incontinent episode and as needed.        Review of Systems  Constitutional:  Positive for activity change. Negative for appetite change.  HENT: Negative.    Respiratory:  Negative for cough and shortness of breath.   Cardiovascular:  Negative for leg swelling.  Gastrointestinal:  Negative for constipation.  Genitourinary: Negative.   Musculoskeletal:  Positive for arthralgias, gait problem and myalgias.  Skin: Negative.   Neurological:  Positive for weakness. Negative for dizziness.  Psychiatric/Behavioral:  Positive for confusion. Negative for dysphoric mood and sleep disturbance. The patient is not nervous/anxious.    Vitals:   05/26/21 1012  BP: 133/67  Pulse: 80  Resp: 16  Temp: (!) 97.3 F (36.3 C)  SpO2: 96%  Weight: 138 lb 9.6 oz (62.9 kg)  Height: 5' 1"  (1.549 m)   Body mass index is 26.19 kg/m. Physical Exam Vitals reviewed.  Constitutional:      Appearance: Normal appearance.  HENT:     Head: Normocephalic.     Nose: Nose normal.     Mouth/Throat:     Mouth: Mucous membranes are moist.     Pharynx: Oropharynx is clear.  Eyes:     Pupils: Pupils are equal, round, and reactive to light.  Cardiovascular:     Rate and Rhythm: Regular rhythm. Tachycardia present.     Pulses: Normal pulses.     Heart sounds: Normal heart sounds.  No murmur heard. Pulmonary:     Effort: Pulmonary effort is normal.     Breath sounds: Normal breath sounds.  Abdominal:     General: Abdomen is flat. Bowel sounds are normal.     Palpations: Abdomen is soft.  Musculoskeletal:        General: No swelling.     Cervical back: Neck supple.  Skin:    General: Skin is warm.  Neurological:     General: No focal deficit  present.     Mental Status: She is alert.  Psychiatric:        Mood and Affect: Mood normal.        Thought Content: Thought content normal.    Labs reviewed: Basic Metabolic Panel: Recent Labs    05/17/21 0132 05/18/21 0253 05/19/21 0101 05/20/21 0219  NA 137 135 136 137  K 3.6 3.5 3.4* 3.5  CL 109 105 104 105  CO2 22 24 22 24   GLUCOSE 105* 107* 109* 108*  BUN 7* 12 13 12   CREATININE 0.88 0.80 0.98 0.81  CALCIUM 8.3* 8.1* 8.1* 8.1*  MG 1.5* 2.1 1.8  --    Liver Function Tests: Recent Labs    05/12/21 1900 05/14/21 1310 05/15/21 0843  AST 21 21 17   ALT 13 14 13   ALKPHOS 172* 162* 150*  BILITOT 0.3 0.6 0.8  PROT 6.8 7.2 6.2*  ALBUMIN 3.3* 3.5 3.0*   No results for input(s): LIPASE, AMYLASE in the last 8760 hours. No results for input(s): AMMONIA in the last 8760 hours. CBC: Recent Labs    05/18/21 0253 05/18/21 1616 05/19/21 0101 05/20/21 0219 05/20/21 1015  WBC 6.4 6.7 8.2  --  7.5  NEUTROABS 3.0  --  5.1  --  3.6  HGB 8.3* 8.3* 8.0*  --  8.2*  HCT 24.9* 25.7* 24.1*  --  25.1*  MCV 89.9 91.8 90.9  --  90.9  PLT 24* 19* 44* 24* 22*   Cardiac Enzymes: No results for input(s): CKTOTAL, CKMB, CKMBINDEX, TROPONINI in the last 8760 hours. BNP: Invalid input(s): POCBNP Lab Results  Component Value Date   HGBA1C 6.8 (H) 05/15/2021   No results found for: TSH Lab Results  Component Value Date   VITAMINB12 1,859 (H) 05/15/2021   No results found for: FOLATE Lab Results  Component Value Date   IRON 141 05/14/2021   TIBC 331 05/14/2021   FERRITIN 1,013 (H) 05/14/2021    Imaging and Procedures obtained prior to SNF admission: CT ANGIO HEAD NECK W WO CM  Result Date: 05/14/2021 CLINICAL DATA:  Acute neuro deficit.  Stroke. EXAM: CT ANGIOGRAPHY HEAD AND NECK TECHNIQUE: Multidetector CT imaging of the head and neck was performed using the standard protocol during bolus administration of intravenous contrast. Multiplanar CT image reconstructions and MIPs  were obtained to evaluate the vascular anatomy. Carotid stenosis measurements (when applicable) are obtained utilizing NASCET criteria, using the distal internal carotid diameter as the denominator. RADIATION DOSE REDUCTION: This exam was performed according to the departmental dose-optimization program which includes automated exposure control, adjustment of the mA and/or kV according to patient size and/or use of iterative reconstruction technique. CONTRAST:  25m OMNIPAQUE IOHEXOL 350 MG/ML SOLN COMPARISON:  MRI head 05/14/2021. FINDINGS: CT HEAD FINDINGS Brain: Generalized atrophy. Moderate white matter hypodensity bilaterally consistent with chronic microvascular ischemia. Small areas of restricted diffusion are present in the cerebral white matter bilaterally and in the right occipital lobe. These are difficult to identified by CT. No acute hemorrhage  or mass. Vascular: Negative for hyperdense vessel Skull: Negative Sinuses: Complete opacification left sphenoid sinus. Possible mucocele. Possible mild expansion. Remaining sinuses clear. Orbits: Negative Review of the MIP images confirms the above findings CTA NECK FINDINGS Aortic arch: Standard branching. Imaged portion shows no evidence of aneurysm or dissection. No significant stenosis of the major arch vessel origins. Right carotid system: Minimal atherosclerotic disease right carotid bifurcation. Negative for stenosis. Mild fibromuscular dysplasia right internal carotid artery without dissection or stenosis. Left carotid system: Mild atherosclerotic disease left carotid bifurcation without stenosis. Mild fibromuscular dysplasia left internal carotid artery without dissection. Approximately 50% diameter stenosis of the left internal carotid artery due to FMD. Vertebral arteries: Both vertebral arteries are normal and widely patent. Skeleton: Cervical spondylosis.  No acute skeletal abnormality. Other neck: Negative for mass or adenopathy in the neck. Upper  chest: Lung apices clear bilaterally. Review of the MIP images confirms the above findings CTA HEAD FINDINGS Anterior circulation: Atherosclerotic calcification throughout the cavernous carotid bilaterally. Mild stenosis bilaterally. Anterior and middle cerebral arteries patent bilaterally without stenosis or large vessel occlusion. Negative for aneurysm. Posterior circulation: Atherosclerotic calcification and mild stenosis of the vertebral artery bilaterally at the foramen magnum. Both vertebral arteries contribute to the basilar. PICA patent bilaterally. Basilar widely patent. Superior cerebellar and posterior cerebral arteries patent bilaterally without stenosis or large vessel occlusion. Venous sinuses: Normal venous enhancement Anatomic variants: None Review of the MIP images confirms the above findings IMPRESSION: 1. Negative for intracranial large vessel occlusion or significant stenosis. 2. Mild atherosclerotic disease the carotid bifurcation bilaterally without stenosis. 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 No significant stenosis on the right. Approximately 50% diameter stenosis left internal carotid artery due to FMD. 3. Both vertebral arteries widely patent. 4. CT head demonstrates microvascular angiopathy. Small acute infarcts best seen on MRI. No acute hemorrhage. Electronically Signed   By: Franchot Gallo M.D.   On: 05/14/2021 18:13   MR BRAIN WO CONTRAST  Result Date: 05/14/2021 CLINICAL DATA:  Transient ischemic attack.  Left foot weakness. EXAM: MRI HEAD WITHOUT CONTRAST TECHNIQUE: Multiplanar, multiecho pulse sequences of the brain and surrounding structures were obtained without intravenous contrast. COMPARISON:  Head CT 01/29/2021.  Brain MRI 03/02/2018. FINDINGS: Brain: Moderate generalized cerebral atrophy. Comparatively mild cerebellar atrophy. Small foci of restricted diffusion within the medial right parietooccipital lobes (series 5, image 91),  posteromedial right frontal lobe (series 5, image 82), left parietal white matter (series 5, image 80), right occipital lobe (series 5, image 67) and posterior right temporal lobe (series 5, image 64). These are compatible with acute infarcts. The posterior right temporal lobe infarct is the largest, measuring 9 mm. Redemonstration of multiple small chronic cortically-based infarcts within the bilateral frontoparietal and occipital lobes. Background advanced patchy and ill-defined hypoattenuation within the cerebral white matter, nonspecific but compatible with chronic small vessel ischemic disease. Scattered chronic microhemorrhages within the supratentorial brain. Partially empty sella turcica. As before, there are multiple small chronic cerebellar infarcts. No extra-axial fluid collection. No midline shift. Vascular: Maintained flow voids within the proximal large arterial vessels. Skull and upper cervical spine: No focal suspicious marrow lesion. Abnormal T1 hypointense marrow signal within the calvarium and visualized upper cervical spine. Sinuses/Orbits: No mass or acute finding within the imaged orbits. Prior right ocular lens replacement. As before, there is complete opacification of the left sphenoid sinus with associated internal T1 hyperintense signal. Minimal mucosal thickening within the right greater than left ethmoid air cells. IMPRESSION: 1. Five foci  of restricted diffusion scattered within the supratentorial brain, as described and measuring up to 9 mm. These are compatible with acute infarcts and involvement of multiple vascular territories raises suspicion for an embolic process. However, metastases may have a similar MR imaging appearance and if the patient has a history of malignancy, a 6 week follow-up brain MRI should be considered for monitoring. 2. Redemonstration of multiple small chronic cortically-based infarcts within the bilateral frontoparietal and occipital lobes. 3. Advanced chronic  small vessel image changes within the cerebral white matter. 4. Scattered chronic microhemorrhages within the supratentorial brain, which may reflect sequela of hypertensive microangiopathy and/or early manifestations of cerebral amyloid angiopathy. 5. Redemonstration of multiple small chronic infarcts within the cerebellum. 6. Moderate generalized cerebral atrophy. Comparatively mild cerebellar atrophy. 7. Abnormal T1 hypointense marrow signal within the calvarium and visualized upper cervical spine. Findings may be related to the patient's chronic anemia. A marrow infiltrative process is the primary differential consideration. 8. Severe left sphenoid sinusitis. Electronically Signed   By: Kellie Simmering D.O.   On: 05/14/2021 15:26   MR BRAIN W CONTRAST  Result Date: 05/14/2021 CLINICAL DATA:  Multiple brain lesions. Rule out infarct versus metastatic disease. EXAM: MRI HEAD WITH CONTRAST TECHNIQUE: Multiplanar, multiecho pulse sequences of the brain and surrounding structures were obtained with intravenous contrast. CONTRAST:  68m GADAVIST GADOBUTROL 1 MMOL/ML IV SOLN COMPARISON:  MRI head without contrast earlier today May 14, 2021 FINDINGS: Brain: Normal enhancement following contrast infusion. Multiple small areas of restricted diffusion are present in the brain which do not show enhancement and therefore consistent with acute infarcts. Atrophy and chronic microvascular ischemic changes as noted on the prior MRI report. Additional findings per unenhanced MRI report earlier today IMPRESSION: No enhancing lesions in the brain. Multiple areas of restricted diffusion compatible with acute infarcts as described on the report earlier today. Electronically Signed   By: CFranchot GalloM.D.   On: 05/14/2021 17:24   [place]Abdomen Complete  Result Date: 05/15/2021 CLINICAL DATA:  Splenomegaly. EXAM: ABDOMEN ULTRASOUND COMPLETE COMPARISON:  August 12, 2006. FINDINGS: Gallbladder: Possible adenomyomatosis of the gallbladder  wall. No gallstones or wall thickening visualized. No sonographic Murphy sign noted by sonographer. Common bile duct: Diameter: 4 mm which is within normal limits. Liver: No focal lesion identified. Within normal limits in parenchymal echogenicity. Portal vein is patent on color Doppler imaging with normal direction of blood flow towards the liver. IVC: No abnormality visualized. Pancreas: 9 mm cystic lesion is seen in the posterior portion of the pancreatic head. No ductal dilatation is noted. Spleen: Size and appearance within normal limits. Right Kidney: Length: 10.3 cm. Echogenicity within normal limits. No mass or hydronephrosis visualized. Left Kidney: Length: 10.8 cm. Echogenicity within normal limits. No mass or hydronephrosis visualized. Abdominal aorta: No aneurysm visualized. Other findings: None. IMPRESSION: 9 mm cystic abnormality seen in posterior portion of pancreatic head. No ductal dilatation is noted. When the patient is clinically stable and able to follow directions and hold their breath (preferably as an outpatient) further evaluation with dedicated abdominal MRI should be considered. Possible adenomyomatosis of gallbladder wall. No cholelithiasis or cholecystitis is noted. Electronically Signed   By: JMarijo ConceptionM.D.   On: 05/15/2021 16:17   ECHOCARDIOGRAM COMPLETE  Result Date: 05/15/2021    ECHOCARDIOGRAM REPORT   Patient Name:   BMELISHA EGGLETONDate of Exam: 05/15/2021 Medical Rec #:  [id number]      Height:       63.5 in Accession #:  [id number]      Weight:       139.2 lb Date of Birth:  1930/03/10       BSA:          1.667 m Patient Age:    45 years        BP:           140/60 mmHg Patient Gender: F               HR:           82 bpm. Exam Location:  Inpatient Procedure: 2D Echo, Cardiac Doppler and Color Doppler Indications:    Stroke  History:        Patient has no prior history of Echocardiogram examinations.                 Risk Factors:Hypertension, Family History of Coronary  Artery                 Disease and Dyslipidemia. Stroke. CKD.  Sonographer:    Clayton Lefort RDCS (AE) Referring Phys: [place]  1. Left ventricular ejection fraction, by estimation, is 60 to 65%. The left ventricle has normal function. The left ventricle has no regional wall motion abnormalities. There is severe left ventricular hypertrophy. Left ventricular diastolic parameters  are consistent with Grade I diastolic dysfunction (impaired relaxation).  2. Right ventricular systolic function is normal. The right ventricular size is normal. Tricuspid regurgitation signal is inadequate for assessing PA pressure.  3. Left atrial size was mild to moderately dilated.  4. The mitral valve is degenerative. No evidence of mitral valve regurgitation. Severe mitral annular calcification.  5. There is mild calcification of the aortic valve. Aortic valve regurgitation is not visualized. Aortic valve sclerosis/calcification is present, without any evidence of aortic stenosis.  6. The inferior vena cava is normal in size with greater than 50% respiratory variability, suggesting right atrial pressure of 3 mmHg. Comparison(s): No prior Echocardiogram. FINDINGS  Left Ventricle: Left ventricular ejection fraction, by estimation, is 60 to 65%. The left ventricle has normal function. The left ventricle has no regional wall motion abnormalities. The left ventricular internal cavity size was normal in size. There is  severe left ventricular hypertrophy. Left ventricular diastolic parameters are consistent with Grade I diastolic dysfunction (impaired relaxation). Right Ventricle: The right ventricular size is normal. Right ventricular systolic function is normal. Tricuspid regurgitation signal is inadequate for assessing PA pressure. Left Atrium: Left atrial size was mild to moderately dilated. Right Atrium: Right atrial size was normal in size. Pericardium: There is no evidence of pericardial effusion. Mitral  Valve: The mitral valve is degenerative in appearance. Severe mitral annular calcification. No evidence of mitral valve regurgitation. MV peak gradient, 8.0 mmHg. The mean mitral valve gradient is 3.0 mmHg. Tricuspid Valve: The tricuspid valve is not well visualized. Tricuspid valve regurgitation is not demonstrated. Aortic Valve: There is mild calcification of the aortic valve. Aortic valve regurgitation is not visualized. Aortic valve sclerosis/calcification is present, without any evidence of aortic stenosis. Aortic valve mean gradient measures 5.0 mmHg. Aortic valve peak gradient measures 8.6 mmHg. Aortic valve area, by VTI measures 1.79 cm. Pulmonic Valve: The pulmonic valve was not well visualized. Pulmonic valve regurgitation is not visualized. Aorta: The aortic root is normal in size and structure. Venous: The inferior vena cava is normal in size with greater than 50% respiratory variability, suggesting right atrial pressure of 3 mmHg.  LEFT VENTRICLE PLAX 2D LVIDd:  4.00 cm   Diastology LVIDs:         2.70 cm   LV e' medial:    6.31 cm/s LV PW:         1.10 cm   LV E/e' medial:  17.9 LV IVS:        1.50 cm   LV e' lateral:   6.85 cm/s LVOT diam:     1.90 cm   LV E/e' lateral: 16.5 LV SV:         56 LV SV Index:   33 LVOT Area:     2.84 cm  RIGHT VENTRICLE             IVC RV Basal diam:  2.00 cm     IVC diam: 1.10 cm RV S prime:     14.80 cm/s TAPSE (M-mode): 1.9 cm LEFT ATRIUM             Index        RIGHT ATRIUM          Index LA diam:        3.20 cm 1.92 cm/m   RA Area:     9.11 cm LA Vol (A2C):   32.2 ml 19.31 ml/m  RA Volume:   15.00 ml 9.00 ml/m LA Vol (A4C):   40.2 ml 24.11 ml/m LA Biplane Vol: 36.1 ml 21.65 ml/m  AORTIC VALVE AV Area (Vmax):    1.76 cm AV Area (Vmean):   1.88 cm AV Area (VTI):     1.79 cm AV Vmax:           147.00 cm/s AV Vmean:          102.000 cm/s AV VTI:            0.312 m AV Peak Grad:      8.6 mmHg AV Mean Grad:      5.0 mmHg LVOT Vmax:         91.00 cm/s  LVOT Vmean:        67.800 cm/s LVOT VTI:          0.197 m LVOT/AV VTI ratio: 0.63  AORTA Ao Root diam: 3.00 cm Ao Asc diam:  3.40 cm MITRAL VALVE MV Area (PHT): 2.84 cm     SHUNTS MV Area VTI:   1.75 cm     Systemic VTI:  0.20 m MV Peak grad:  8.0 mmHg     Systemic Diam: 1.90 cm MV Mean grad:  3.0 mmHg MV Vmax:       1.41 m/s MV Vmean:      82.0 cm/s MV Decel Time: 267 msec MV E velocity: 113.00 cm/s MV A velocity: 133.00 cm/s MV E/A ratio:  0.85 Landscape architect signed by Phineas Inches Signature Date/Time: 05/15/2021/2:14:04 PM    Final    VAS [place] LOWER EXTREMITY VENOUS (DVT)  Result Date: 05/15/2021  Lower Venous DVT Study Patient Name:  IVANELL DESHOTEL  Date of Exam:   05/15/2021 Medical Rec #: [id number]        Accession #:    [id number] Date of Birth: 07-15-1930        Patient Gender: F Patient Age:   43 years Exam Location:  [facility] Procedure:      VAS [place] LOWER EXTREMITY VENOUS (DVT) Referring Phys: Cornelius Moras XU --------------------------------------------------------------------------------  Indications: Stroke.  Comparison Study: no prior Performing Technologist: Archie Patten RVS  Examination Guidelines: A complete evaluation includes B-mode imaging, spectral Doppler, color Doppler, and  power Doppler as needed of all accessible portions of each vessel. Bilateral testing is considered an integral part of a complete examination. Limited examinations for reoccurring indications may be performed as noted. The reflux portion of the exam is performed with the patient in reverse Trendelenburg.  +---------+---------------+---------+-----------+----------+--------------+ RIGHT    CompressibilityPhasicitySpontaneityPropertiesThrombus Aging +---------+---------------+---------+-----------+----------+--------------+ CFV      Full           Yes      Yes                                 +---------+---------------+---------+-----------+----------+--------------+ SFJ      Full                                                         +---------+---------------+---------+-----------+----------+--------------+ FV Prox  Full                                                        +---------+---------------+---------+-----------+----------+--------------+ FV Mid   Full                                                        +---------+---------------+---------+-----------+----------+--------------+ FV DistalFull                                                        +---------+---------------+---------+-----------+----------+--------------+ PFV      Full                                                        +---------+---------------+---------+-----------+----------+--------------+ POP      Full           Yes      Yes                                 +---------+---------------+---------+-----------+----------+--------------+ PTV      Full                                                        +---------+---------------+---------+-----------+----------+--------------+ PERO     Full                                                        +---------+---------------+---------+-----------+----------+--------------+   +---------+---------------+---------+-----------+----------+--------------+  LEFT     CompressibilityPhasicitySpontaneityPropertiesThrombus Aging +---------+---------------+---------+-----------+----------+--------------+ CFV      Full           Yes      Yes                                 +---------+---------------+---------+-----------+----------+--------------+ SFJ      Full                                                        +---------+---------------+---------+-----------+----------+--------------+ FV Prox  Full                                                        +---------+---------------+---------+-----------+----------+--------------+ FV Mid   Full                                                         +---------+---------------+---------+-----------+----------+--------------+ FV DistalFull                                                        +---------+---------------+---------+-----------+----------+--------------+ PFV      Full                                                        +---------+---------------+---------+-----------+----------+--------------+ POP      Full           Yes      Yes                                 +---------+---------------+---------+-----------+----------+--------------+ PTV      Full                                                        +---------+---------------+---------+-----------+----------+--------------+ PERO     Full                                                        +---------+---------------+---------+-----------+----------+--------------+     Summary: BILATERAL: - No evidence of deep vein thrombosis seen in the lower extremities, bilaterally. -No evidence of popliteal cyst, bilaterally.   *See table(s) above for measurements and observations. Electronically signed by Harold Barban MD on 05/15/2021 at 8:13:42 PM.  Final     Assessment/Plan Patient with AML, Thrombocytopenia, Possible CVA Now admitted with Hospice Seems to be in mild distress with tachycardia and sweating Will go ahead and schedule her Roxanol QID Also start on ativan prn Continue to watch for any constipation Use Oxygen Prn    Family/ staff Communication: D/W daughter  Labs/tests ordered:

## 2021-05-27 ENCOUNTER — Telehealth: Payer: Self-pay

## 2021-05-27 ENCOUNTER — Encounter (HOSPITAL_COMMUNITY): Payer: Self-pay | Admitting: Hematology

## 2021-05-27 DIAGNOSIS — I69391 Dysphagia following cerebral infarction: Secondary | ICD-10-CM | POA: Diagnosis not present

## 2021-05-27 DIAGNOSIS — D61818 Other pancytopenia: Secondary | ICD-10-CM | POA: Diagnosis not present

## 2021-05-27 DIAGNOSIS — I672 Cerebral atherosclerosis: Secondary | ICD-10-CM | POA: Diagnosis not present

## 2021-05-27 DIAGNOSIS — C9200 Acute myeloblastic leukemia, not having achieved remission: Secondary | ICD-10-CM | POA: Diagnosis not present

## 2021-05-27 DIAGNOSIS — G9340 Encephalopathy, unspecified: Secondary | ICD-10-CM | POA: Diagnosis not present

## 2021-05-27 DIAGNOSIS — I69354 Hemiplegia and hemiparesis following cerebral infarction affecting left non-dominant side: Secondary | ICD-10-CM | POA: Diagnosis not present

## 2021-05-27 NOTE — Telephone Encounter (Signed)
Refill request received from pharmacy for Morphine 100/*81m ? ?Take 0.25 mls('5mg'$ ) by mouth 4 times a day *can hold if sleepy* *double check dose* use calibrated syringe. ? ?Medication not on medication list. ? ?Routed to Dr. GVeleta Miners MD ?

## 2021-05-28 DIAGNOSIS — G9340 Encephalopathy, unspecified: Secondary | ICD-10-CM | POA: Diagnosis not present

## 2021-05-28 DIAGNOSIS — I69354 Hemiplegia and hemiparesis following cerebral infarction affecting left non-dominant side: Secondary | ICD-10-CM | POA: Diagnosis not present

## 2021-05-28 DIAGNOSIS — D61818 Other pancytopenia: Secondary | ICD-10-CM | POA: Diagnosis not present

## 2021-05-28 DIAGNOSIS — I69391 Dysphagia following cerebral infarction: Secondary | ICD-10-CM | POA: Diagnosis not present

## 2021-05-28 DIAGNOSIS — C9200 Acute myeloblastic leukemia, not having achieved remission: Secondary | ICD-10-CM | POA: Diagnosis not present

## 2021-05-28 DIAGNOSIS — I672 Cerebral atherosclerosis: Secondary | ICD-10-CM | POA: Diagnosis not present

## 2021-05-28 NOTE — Telephone Encounter (Signed)
I have already taken care of this

## 2021-05-29 DIAGNOSIS — I69354 Hemiplegia and hemiparesis following cerebral infarction affecting left non-dominant side: Secondary | ICD-10-CM | POA: Diagnosis not present

## 2021-05-29 DIAGNOSIS — I69391 Dysphagia following cerebral infarction: Secondary | ICD-10-CM | POA: Diagnosis not present

## 2021-05-29 DIAGNOSIS — I672 Cerebral atherosclerosis: Secondary | ICD-10-CM | POA: Diagnosis not present

## 2021-05-29 DIAGNOSIS — G9340 Encephalopathy, unspecified: Secondary | ICD-10-CM | POA: Diagnosis not present

## 2021-05-29 DIAGNOSIS — D61818 Other pancytopenia: Secondary | ICD-10-CM | POA: Diagnosis not present

## 2021-05-29 DIAGNOSIS — C9200 Acute myeloblastic leukemia, not having achieved remission: Secondary | ICD-10-CM | POA: Diagnosis not present

## 2021-05-30 DIAGNOSIS — D61818 Other pancytopenia: Secondary | ICD-10-CM | POA: Diagnosis not present

## 2021-05-30 DIAGNOSIS — I69354 Hemiplegia and hemiparesis following cerebral infarction affecting left non-dominant side: Secondary | ICD-10-CM | POA: Diagnosis not present

## 2021-05-30 DIAGNOSIS — C9200 Acute myeloblastic leukemia, not having achieved remission: Secondary | ICD-10-CM | POA: Diagnosis not present

## 2021-05-30 DIAGNOSIS — I672 Cerebral atherosclerosis: Secondary | ICD-10-CM | POA: Diagnosis not present

## 2021-05-30 DIAGNOSIS — G9340 Encephalopathy, unspecified: Secondary | ICD-10-CM | POA: Diagnosis not present

## 2021-05-30 DIAGNOSIS — I69391 Dysphagia following cerebral infarction: Secondary | ICD-10-CM | POA: Diagnosis not present

## 2021-05-31 DIAGNOSIS — D61818 Other pancytopenia: Secondary | ICD-10-CM | POA: Diagnosis not present

## 2021-05-31 DIAGNOSIS — I69391 Dysphagia following cerebral infarction: Secondary | ICD-10-CM | POA: Diagnosis not present

## 2021-05-31 DIAGNOSIS — I672 Cerebral atherosclerosis: Secondary | ICD-10-CM | POA: Diagnosis not present

## 2021-05-31 DIAGNOSIS — G9340 Encephalopathy, unspecified: Secondary | ICD-10-CM | POA: Diagnosis not present

## 2021-05-31 DIAGNOSIS — I69354 Hemiplegia and hemiparesis following cerebral infarction affecting left non-dominant side: Secondary | ICD-10-CM | POA: Diagnosis not present

## 2021-05-31 DIAGNOSIS — C9200 Acute myeloblastic leukemia, not having achieved remission: Secondary | ICD-10-CM | POA: Diagnosis not present

## 2021-06-11 DEATH — deceased

## 2022-09-10 IMAGING — MR MR HEAD W/ CM
6 series · 48 of 48 positions shown · IV contrast (gadavist)
Comparison: MRI head without contrast earlier today May 14, 2021

CLINICAL DATA: Multiple brain lesions. Rule out infarct versus
metastatic disease.

EXAM:
MRI HEAD WITH CONTRAST
TECHNIQUE: Multiplanar, multiecho pulse sequences of the brain and surrounding
structures were obtained with intravenous contrast.
CONTRAST:  6mL GADAVIST GADOBUTROL 1 MMOL/ML IV SOLN

[Series 5: T2 post-contrast · coronal · 5.0mm · 0.57mm/px · 5 of 28 slices shown]
[im 1/28]
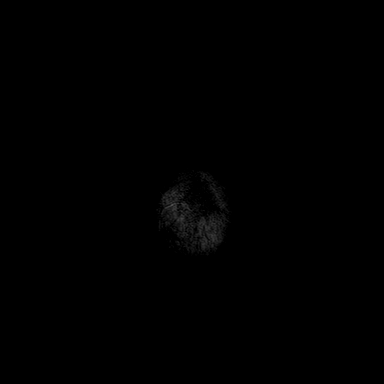
[im 7/28]
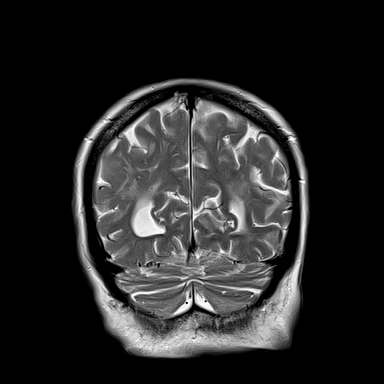
[im 14/28]
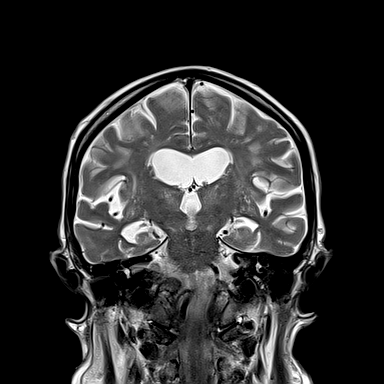
[im 21/28]
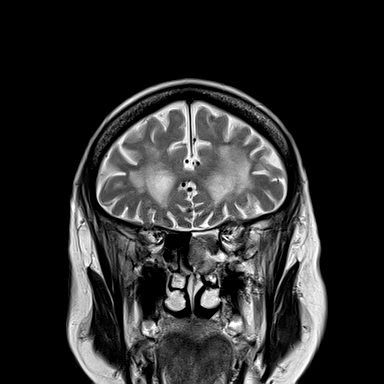
[im 28/28]
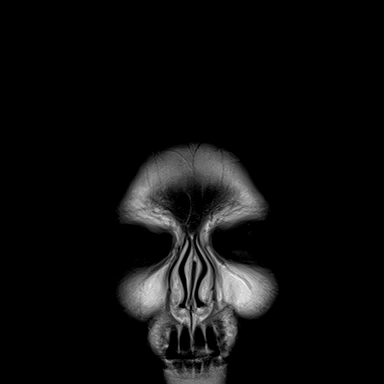

[Series 6: T1 post-contrast · axial · 1.0mm · 0.94mm/px · z∈[-43,+97]mm · 24 of 144 slices shown (1 of 3)]
[im 1/144]
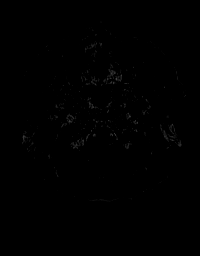
[im 7/144]
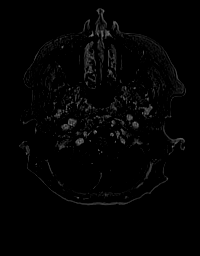
[im 13/144]
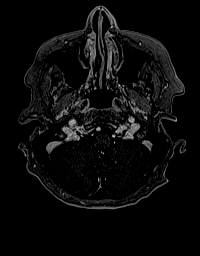
[im 19/144]
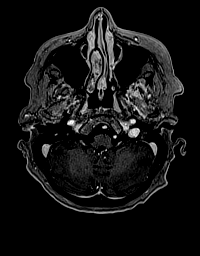
[im 25/144]
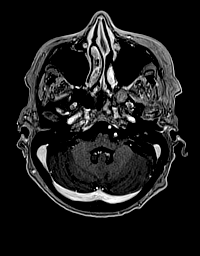
[im 32/144]
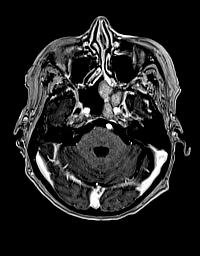
[im 38/144]
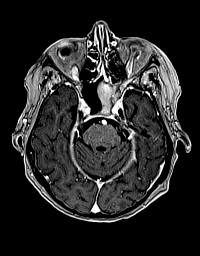
[im 44/144]
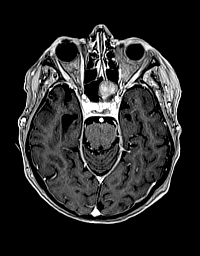
[im 50/144]
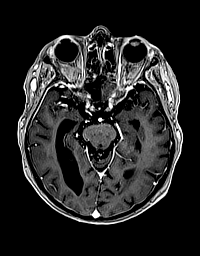
[im 56/144]
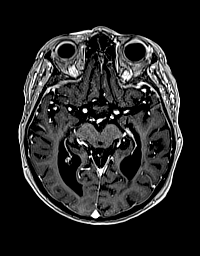
[im 63/144]
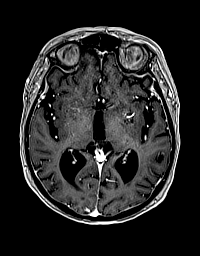
[im 69/144]
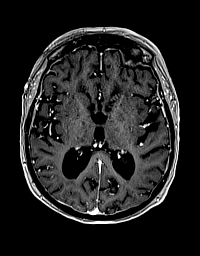
[im 75/144]
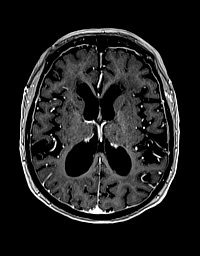
[im 81/144]
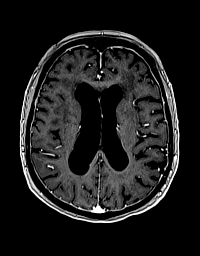
[im 88/144]
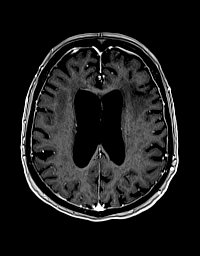
[im 94/144]
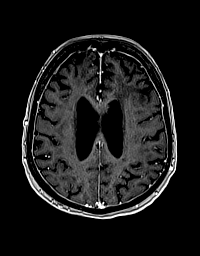
[im 100/144]
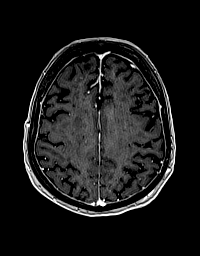
[im 106/144]
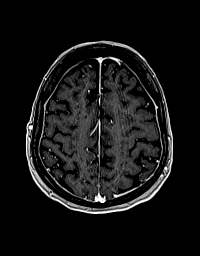
[im 112/144]
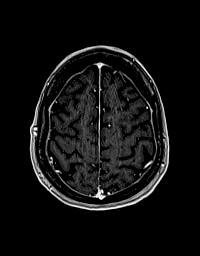
[im 119/144]
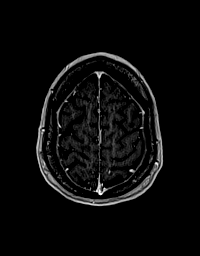
[im 125/144]
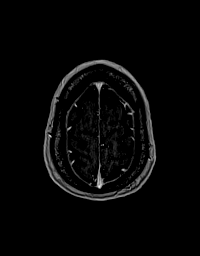
[im 131/144]
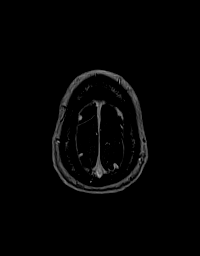
[im 137/144]
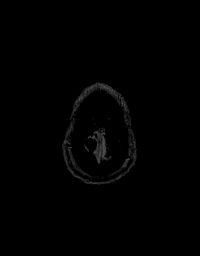
[im 144/144]
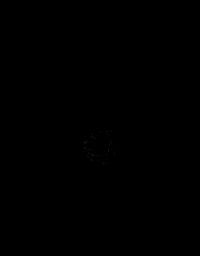

[Series 7: T1 · sagittal · 4.0mm · 0.94mm/px · 5 of 30 slices shown (1 of 2)]
[im 1/30]
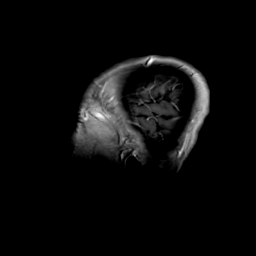
[im 8/30]
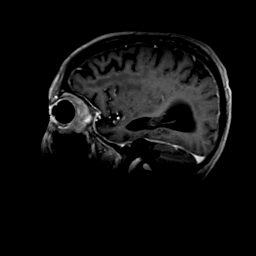
[im 15/30]
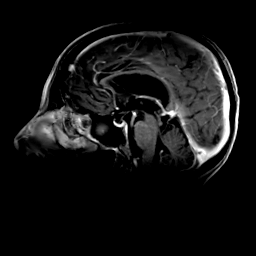
[im 22/30]
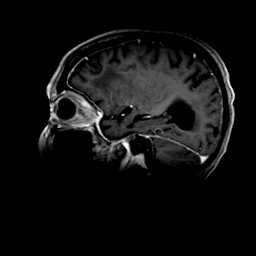
[im 30/30]
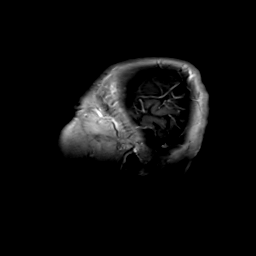

[Series 8: T1 · coronal · 4.0mm · 0.94mm/px · 6 of 36 slices shown (2 of 2)]
[im 1/36]
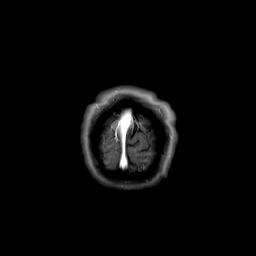
[im 8/36]
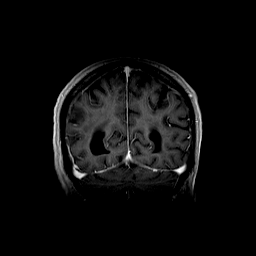
[im 15/36]
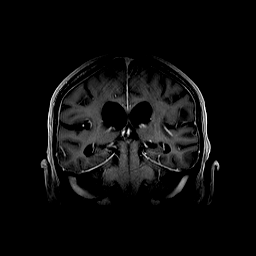
[im 22/36]
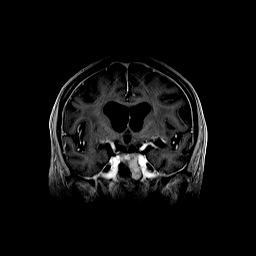
[im 29/36]
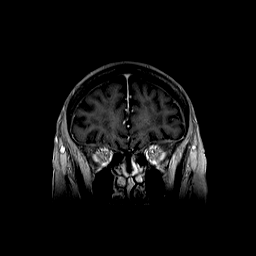
[im 36/36]
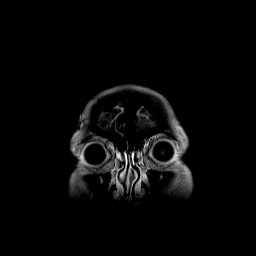

[Series 9: T1 post-contrast · coronal · 5.0mm · 0.43mm/px · 4 of 26 slices shown (2 of 3)]
[im 1/26]
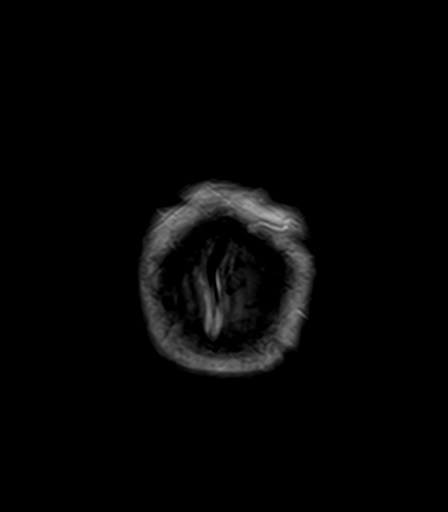
[im 9/26]
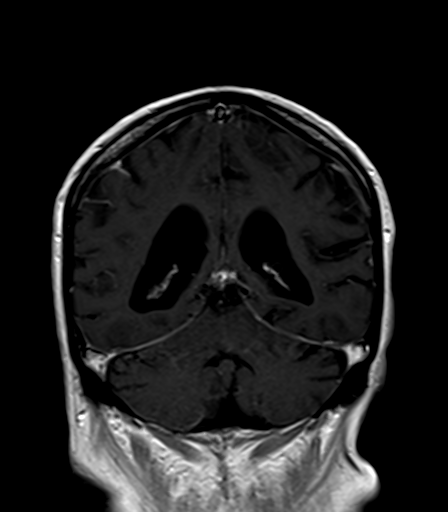
[im 17/26]
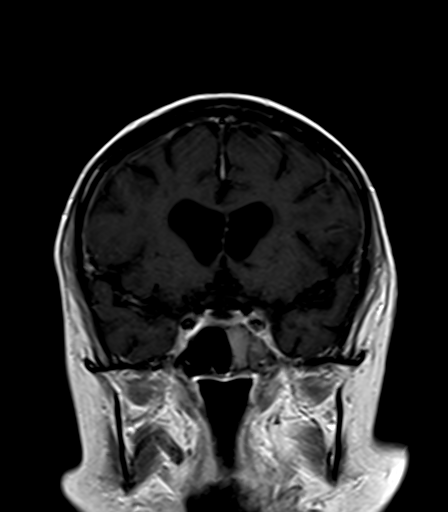
[im 26/26]
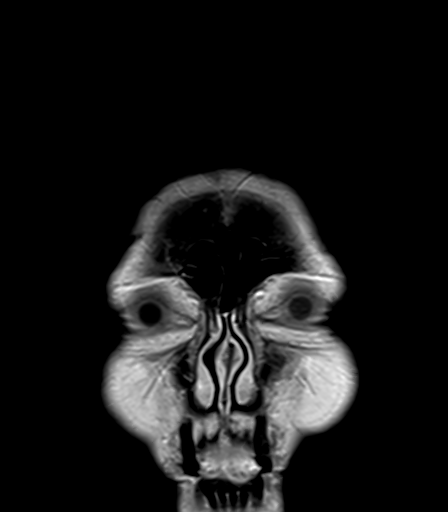

[Series 10: T1 post-contrast · sagittal · 5.0mm · 0.75mm/px · 4 of 24 slices shown (3 of 3)]
[im 1/24]
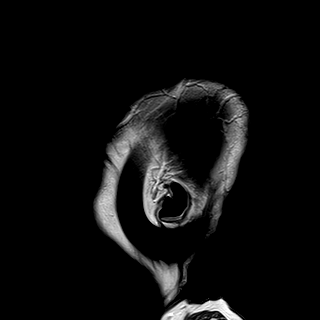
[im 8/24]
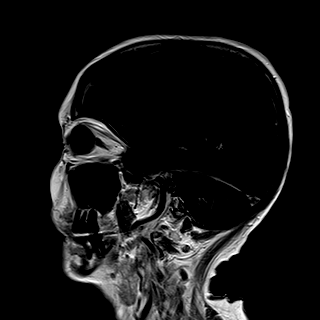
[im 16/24]
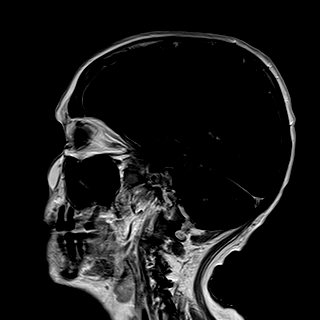
[im 24/24]
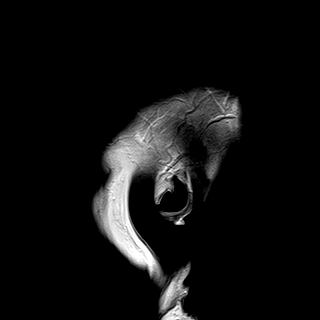

[48 of 48 positions shown; findings below may reference images not displayed]

FINDINGS: Brain: Normal enhancement following contrast infusion. Multiple
small areas of restricted diffusion are present in the brain which
do not show enhancement and therefore consistent with acute
infarcts.

Atrophy and chronic microvascular ischemic changes as noted on the
prior MRI report. Additional findings per unenhanced MRI report
earlier today
IMPRESSION: No enhancing lesions in the brain. Multiple areas of restricted
diffusion compatible with acute infarcts as described on the report
earlier today.

## 2022-09-16 IMAGING — CR DG CHEST 2V
2 series · 2 of 2 positions shown · non-contrast
Comparison: CTA chest 05/12/2021

CLINICAL DATA: Hypoxia.

EXAM:
CHEST - 2 VIEW

[chest lat]
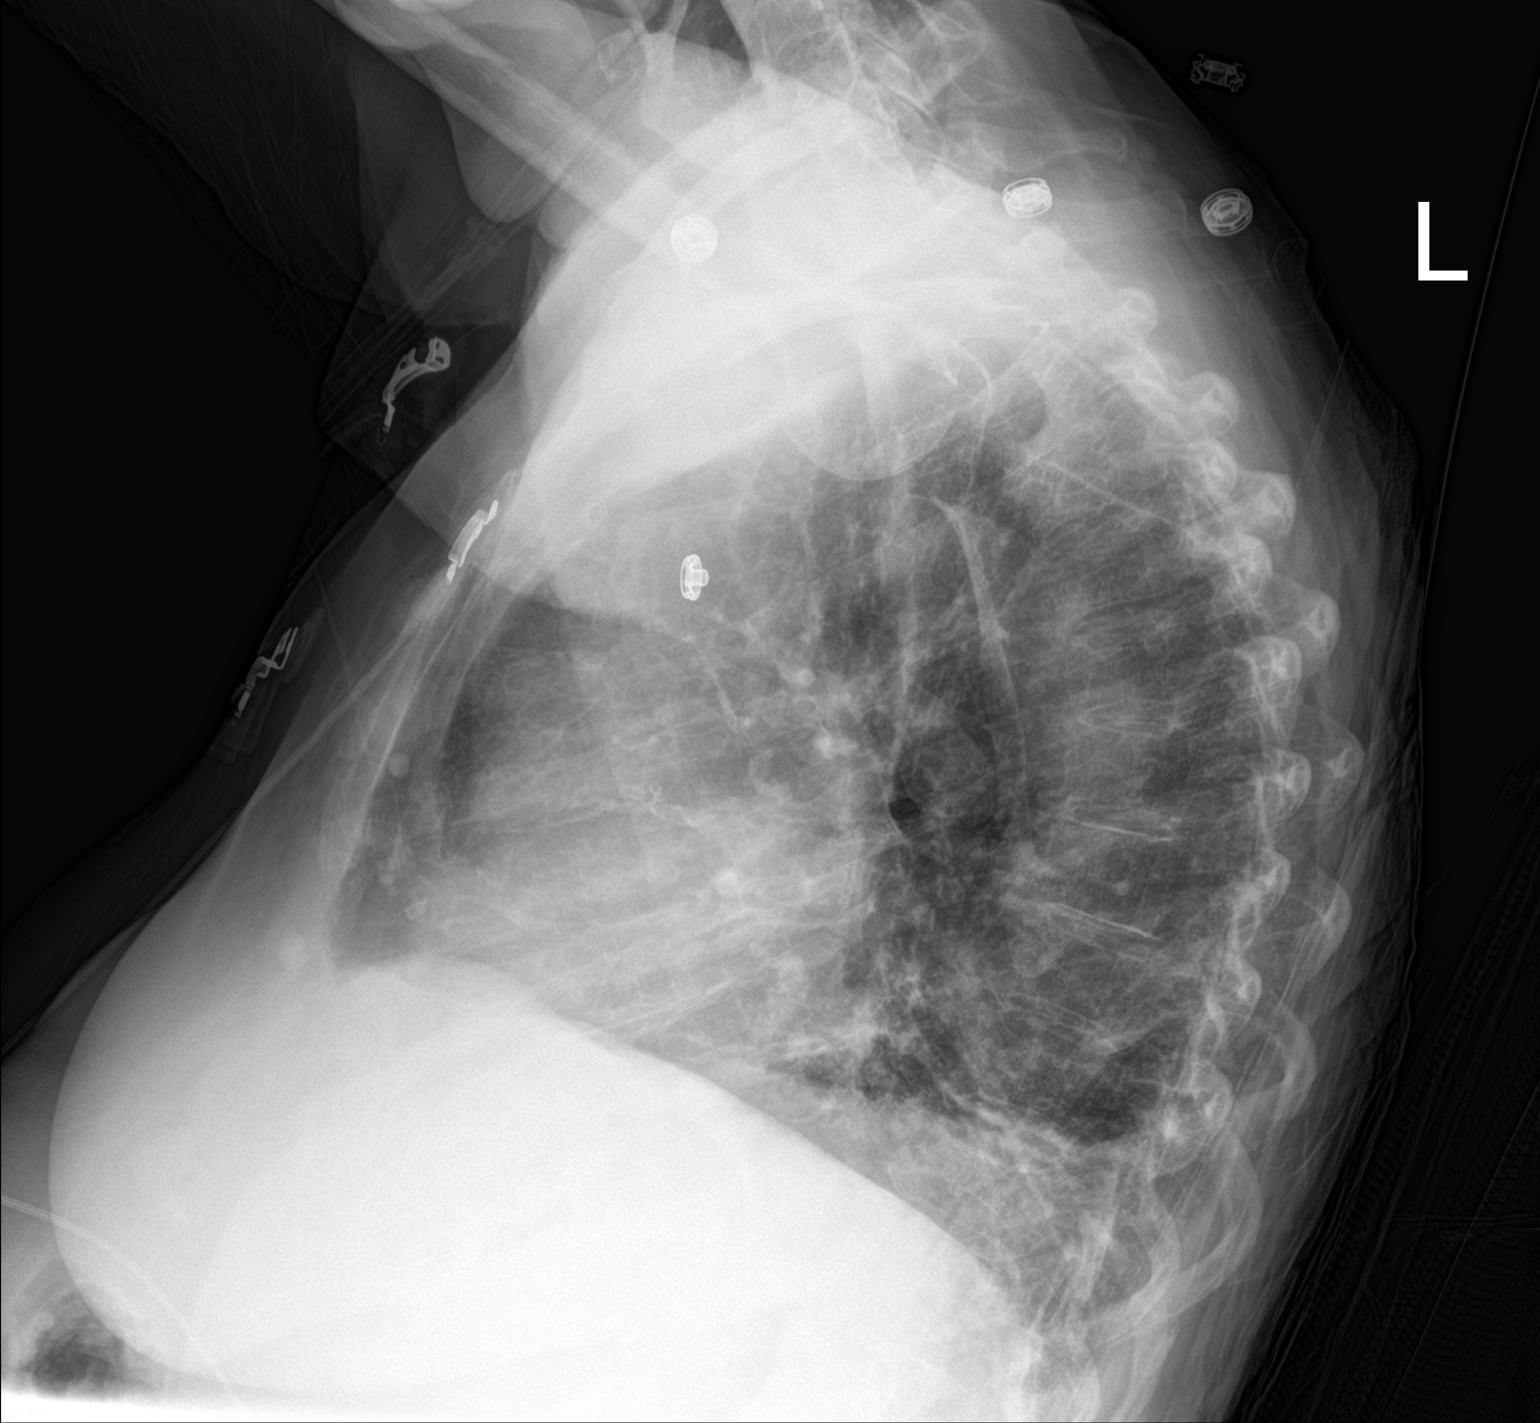

[chest ap]
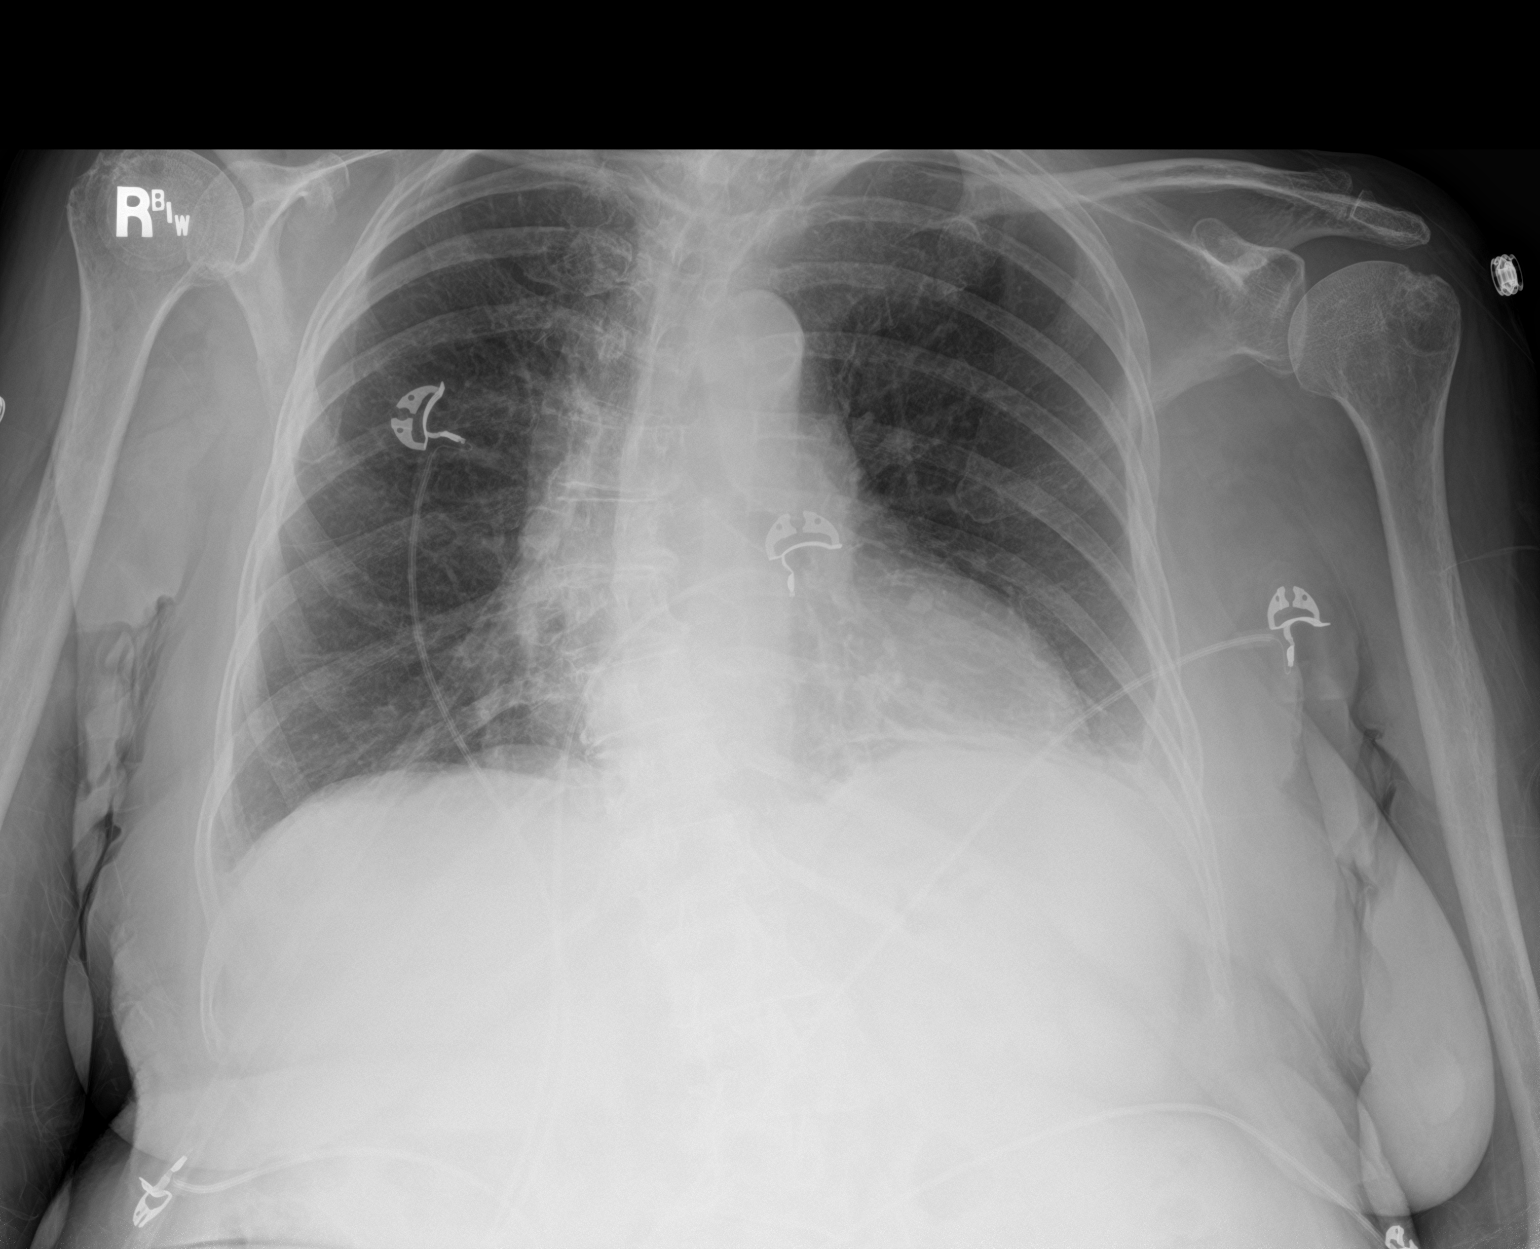

[2 of 2 positions shown; findings below may reference images not displayed]

FINDINGS: Cardiac silhouette is again at the upper limits of normal size.
Mediastinal contours are within normal limits with calcification
seen within aortic arch. Moderate dextrocurvature of the thoracic
spine. Left basilar horizontal linear scarring is similar to prior
CT. No definite pleural effusion. No pneumothorax. Moderate
multilevel degenerative disc changes of the thoracic spine.Moderate
anterior height loss of the approximate T11 vertebral body, similar
to prior CT.
IMPRESSION: Left basilar horizontal linear likely scarring, similar to recent
CT.
